# Patient Record
Sex: Male | Born: 1980 | Race: Black or African American | Hispanic: No | Smoking: Former smoker
Health system: Southern US, Community
[De-identification: ages and names within clinical notes are randomized; demographics above are authoritative.]

## PROBLEM LIST (undated history)

## (undated) ENCOUNTER — Ambulatory Visit: Admission: EM | Source: Home / Self Care

## (undated) DIAGNOSIS — G4730 Sleep apnea, unspecified: Secondary | ICD-10-CM

## (undated) DIAGNOSIS — J45909 Unspecified asthma, uncomplicated: Secondary | ICD-10-CM

## (undated) DIAGNOSIS — I4891 Unspecified atrial fibrillation: Secondary | ICD-10-CM

## (undated) DIAGNOSIS — I639 Cerebral infarction, unspecified: Secondary | ICD-10-CM

## (undated) DIAGNOSIS — T7840XA Allergy, unspecified, initial encounter: Secondary | ICD-10-CM

## (undated) DIAGNOSIS — I10 Essential (primary) hypertension: Secondary | ICD-10-CM

## (undated) HISTORY — DX: Allergy, unspecified, initial encounter: T78.40XA

## (undated) HISTORY — DX: Essential (primary) hypertension: I10

## (undated) HISTORY — DX: Unspecified asthma, uncomplicated: J45.909

## (undated) HISTORY — PX: TONSILLECTOMY: SUR1361

## (undated) HISTORY — DX: Unspecified atrial fibrillation: I48.91

---

## 2016-10-23 ENCOUNTER — Encounter: Payer: Self-pay | Admitting: Internal Medicine

## 2016-10-23 ENCOUNTER — Ambulatory Visit (INDEPENDENT_AMBULATORY_CARE_PROVIDER_SITE_OTHER): Payer: 59 | Admitting: Internal Medicine

## 2016-10-23 DIAGNOSIS — I10 Essential (primary) hypertension: Secondary | ICD-10-CM | POA: Insufficient documentation

## 2016-10-23 DIAGNOSIS — I480 Paroxysmal atrial fibrillation: Secondary | ICD-10-CM | POA: Insufficient documentation

## 2016-10-23 DIAGNOSIS — I4891 Unspecified atrial fibrillation: Secondary | ICD-10-CM | POA: Insufficient documentation

## 2016-10-23 MED ORDER — AMLODIPINE BESYLATE 10 MG PO TABS: 10.0000 mg | ORAL_TABLET | Freq: Every day | ORAL | 0 refills | Status: DC

## 2016-10-23 NOTE — Progress Notes (Signed)
HPI  Pt presents to the clinic today to establish care and for management of the conditions listed below. He is transferring care from Dr. Sena SlatePinckney at [facility].  Childhood Asthma: This has not been an issue for him as an adult.  Afib: Controlled with Carvedilol. He is not taking any anticoagulation. He does not follow with cardiology.  HTN: His BP today is 142/94. He is taking Catapres (but reports he has only taking it twice since he moved here) and Carvedilol as prescribed.  Flu: 11/2015 Tetanus: < 10 years Dentist: biannually  Past Medical History:  Diagnosis Date  . Atrial fibrillation (HCC)   . Childhood asthma   . Hypertension     Current Outpatient Prescriptions  Medication Sig Dispense Refill  . carvedilol (COREG) 12.5 MG tablet Take 1 tablet by mouth 2 (two) times daily.    . cloNIDine (CATAPRES) 0.1 MG tablet Take 0.1 mg by mouth as needed.     No current facility-administered medications for this visit.     No Known Allergies  Family History  Problem Relation Age of Onset  . Diabetes Father   . Mental illness Father   . Heart disease Maternal Grandfather     Social History   Social History  . Marital status: Married    Spouse name: N/A  . Number of children: N/A  . Years of education: N/A   Occupational History  . Not on file.   Social History Main Topics  . Smoking status: Not on file  . Smokeless tobacco: Not on file  . Alcohol use Not on file  . Drug use: Unknown  . Sexual activity: Not on file   Other Topics Concern  . Not on file   Social History Narrative  . No narrative on file    ROS:  Constitutional: Denies fever, malaise, fatigue, headache or abrupt weight changes.  HEENT: Denies eye pain, eye redness, ear pain, ringing in the ears, wax buildup, runny nose, nasal congestion, bloody nose, or sore throat. Respiratory: Denies difficulty breathing, shortness of breath, cough or sputum production.   Cardiovascular: Denies  chest pain, chest tightness, palpitations or swelling in the hands or feet.  Gastrointestinal: Denies abdominal pain, bloating, constipation, diarrhea or blood in the stool.  GU: Denies frequency, urgency, pain with urination, blood in urine, odor or discharge. Musculoskeletal: Denies decrease in range of motion, difficulty with gait, muscle pain or joint pain and swelling.  Skin: Denies redness, rashes, lesions or ulcercations.  Neurological: Denies dizziness, difficulty with memory, difficulty with speech or problems with balance and coordination.  Psych: Denies anxiety, depression, SI/HI.  No other specific complaints in a complete review of systems (except as listed in HPI above).  PE:  BP (!) 142/94   Pulse 69   Temp 98.4 F (36.9 C) (Oral)   Ht 6' 0.5" (1.842 m)   Wt 276 lb (125.2 kg)   SpO2 98%   BMI 36.92 kg/m   Wt Readings from Last 3 Encounters:  No data found for Wt    General: Appears his stated age, obese in NAD. Skin: Dry and intact. Cardiovascular: Normal rate and rhythm. S1,S2 noted.  No murmur, rubs or gallops noted.  Pulmonary/Chest: Normal effort and positive vesicular breath sounds. No respiratory distress. No wheezes, rales or ronchi noted.  Neurological: Alert and oriented.  Psychiatric: Mood and affect normal. Behavior is normal. Judgment and thought content normal.    Assessment and Plan:

## 2016-10-23 NOTE — Assessment & Plan Note (Signed)
He declines cardiology referral Continue Carvedilol for now

## 2016-10-23 NOTE — Patient Instructions (Signed)

## 2016-10-23 NOTE — Assessment & Plan Note (Signed)
Uncontrolled Start Amlodipine 10 mg daily in addition to Carvedilol Advised him not to take Clonidine at this time

## 2016-10-23 NOTE — Assessment & Plan Note (Signed)
>>  ASSESSMENT AND PLAN FOR PAROXYSMAL ATRIAL FIBRILLATION (HCC) WRITTEN ON 10/23/2016 11:37 AM BY Lorre Munroe, NP  He declines cardiology referral Continue Carvedilol for now

## 2016-10-23 NOTE — Assessment & Plan Note (Signed)
>>  ASSESSMENT AND PLAN FOR HTN (HYPERTENSION) WRITTEN ON 10/23/2016 11:38 AM BY Lorre Munroe, NP  Uncontrolled Start Amlodipine 10 mg daily in addition to Carvedilol Advised him not to take Clonidine at this time

## 2016-10-24 ENCOUNTER — Encounter: Payer: Self-pay | Admitting: Emergency Medicine

## 2016-10-24 DIAGNOSIS — K8020 Calculus of gallbladder without cholecystitis without obstruction: Secondary | ICD-10-CM | POA: Insufficient documentation

## 2016-10-24 DIAGNOSIS — R1011 Right upper quadrant pain: Secondary | ICD-10-CM | POA: Diagnosis not present

## 2016-10-24 DIAGNOSIS — I10 Essential (primary) hypertension: Secondary | ICD-10-CM | POA: Diagnosis not present

## 2016-10-24 DIAGNOSIS — Z79899 Other long term (current) drug therapy: Secondary | ICD-10-CM | POA: Diagnosis not present

## 2016-10-24 DIAGNOSIS — I4891 Unspecified atrial fibrillation: Secondary | ICD-10-CM | POA: Diagnosis not present

## 2016-10-24 DIAGNOSIS — R109 Unspecified abdominal pain: Secondary | ICD-10-CM | POA: Diagnosis present

## 2016-10-24 LAB — URINALYSIS, COMPLETE (UACMP) WITH MICROSCOPIC
BACTERIA UA: NONE SEEN
BILIRUBIN URINE: NEGATIVE
Glucose, UA: NEGATIVE mg/dL
HGB URINE DIPSTICK: NEGATIVE
Ketones, ur: NEGATIVE mg/dL
Leukocytes, UA: NEGATIVE
NITRITE: NEGATIVE
PROTEIN: NEGATIVE mg/dL
Specific Gravity, Urine: 1.003 — ABNORMAL LOW (ref 1.005–1.030)
Squamous Epithelial / LPF: NONE SEEN
pH: 6 (ref 5.0–8.0)

## 2016-10-24 LAB — CBC
HEMATOCRIT: 39 % — AB (ref 40.0–52.0)
HEMOGLOBIN: 13.2 g/dL (ref 13.0–18.0)
MCH: 27.5 pg (ref 26.0–34.0)
MCHC: 33.8 g/dL (ref 32.0–36.0)
MCV: 81.3 fL (ref 80.0–100.0)
Platelets: 279 10*3/uL (ref 150–440)
RBC: 4.8 MIL/uL (ref 4.40–5.90)
RDW: 12.7 % (ref 11.5–14.5)
WBC: 6.7 10*3/uL (ref 3.8–10.6)

## 2016-10-24 NOTE — ED Triage Notes (Signed)
Patient ambulatory to triage with steady gait, without difficulty or distress noted; pt reports upper abd and lower back pain intermittently last couple days; denies any accomp symptoms

## 2016-10-25 ENCOUNTER — Emergency Department: Payer: 59

## 2016-10-25 ENCOUNTER — Emergency Department
Admission: EM | Admit: 2016-10-25 | Discharge: 2016-10-25 | Disposition: A | Discharge status: Home / Self Care | Payer: 59 | Attending: Emergency Medicine | Admitting: Emergency Medicine

## 2016-10-25 DIAGNOSIS — K8020 Calculus of gallbladder without cholecystitis without obstruction: Secondary | ICD-10-CM

## 2016-10-25 DIAGNOSIS — R1011 Right upper quadrant pain: Secondary | ICD-10-CM

## 2016-10-25 LAB — COMPREHENSIVE METABOLIC PANEL
ALT: 28 U/L (ref 17–63)
ANION GAP: 6 (ref 5–15)
AST: 22 U/L (ref 15–41)
Albumin: 4.1 g/dL (ref 3.5–5.0)
Alkaline Phosphatase: 65 U/L (ref 38–126)
BILIRUBIN TOTAL: 0.5 mg/dL (ref 0.3–1.2)
BUN: 15 mg/dL (ref 6–20)
CO2: 27 mmol/L (ref 22–32)
Calcium: 9.2 mg/dL (ref 8.9–10.3)
Chloride: 108 mmol/L (ref 101–111)
Creatinine, Ser: 1.26 mg/dL — ABNORMAL HIGH (ref 0.61–1.24)
Glucose, Bld: 97 mg/dL (ref 65–99)
POTASSIUM: 3.8 mmol/L (ref 3.5–5.1)
Sodium: 141 mmol/L (ref 135–145)
TOTAL PROTEIN: 7.2 g/dL (ref 6.5–8.1)

## 2016-10-25 LAB — TROPONIN I

## 2016-10-25 LAB — LIPASE, BLOOD: LIPASE: 33 U/L (ref 11–51)

## 2016-10-25 MED ORDER — AMLODIPINE BESYLATE 5 MG PO TABS
10.0000 mg | ORAL_TABLET | Freq: Once | ORAL | Status: AC
Start: 2016-10-25 — End: 2016-10-25
  Administered 2016-10-25: 10 mg via ORAL
  Filled 2016-10-25: qty 2

## 2016-10-25 NOTE — ED Provider Notes (Signed)
[facility] Emergency Department Provider Note _   First MD Initiated Contact with Patient 10/25/16 [phone]     (approximate)  I have reviewed the triage vital signs and the nursing notes.   HISTORY  Chief Complaint Abdominal Pain   HPI Zachary Cruz is a 36 y.o. male with history of atrial fibrillation in 2015 no longer requiring anticoagulation, hypertension presents to the emergency department with intermittent upper abdominal pain 2 days. Patient denies any nausea or vomiting. Patient denies any diarrhea or constipation. Patient denies any urinary symptoms.   Past Medical History:  Diagnosis Date  . Atrial fibrillation (HCC)   . Childhood asthma   . Hypertension     Patient Active Problem List   Diagnosis Date Noted  . A-fib (HCC) 10/23/2016  . HTN (hypertension) 10/23/2016    Past Surgical History:  Procedure Laterality Date  . TONSILLECTOMY      Prior to Admission medications   Medication Sig Start Date End Date Taking? Authorizing Provider  amLODipine (NORVASC) 10 MG tablet Take 1 tablet (10 mg total) by mouth daily. 10/23/16   Lorre MunroeBaity, Regina W, NP  carvedilol (COREG) 12.5 MG tablet Take 1 tablet by mouth 2 (two) times daily.    [provider]  cloNIDine (CATAPRES) 0.1 MG tablet Take 0.1 mg by mouth as needed.    [provider]    Allergies No known drug allergies  Family History  Problem Relation Age of Onset  . Diabetes Father   . Mental illness Father   . Heart disease Maternal Grandfather     Social History Social History  Substance Use Topics  . Smoking status: Never Smoker  . Smokeless tobacco: Never Used  . Alcohol use No    Review of Systems Constitutional: No fever/chills Eyes: No visual changes. ENT: No sore throat. Cardiovascular: Denies chest pain. Respiratory: Denies shortness of breath. Gastrointestinal: positive for abdominal pain.  No nausea, no vomiting.  No diarrhea.  No  constipation. Genitourinary: Negative for dysuria. Musculoskeletal: Negative for neck pain.  Negative for back pain. Integumentary: Negative for rash. Neurological: Negative for headaches, focal weakness or numbness.  ____________________________________________   PHYSICAL EXAM:  VITAL SIGNS: ED Triage Vitals  Enc Vitals Group     BP 10/24/16 2340 (!) 165/100     Pulse Rate 10/24/16 2340 96     Resp 10/24/16 2340 20     Temp 10/24/16 2340 97.6 F (36.4 C)     Temp Source 10/24/16 2340 Oral     SpO2 10/24/16 2340 99 %     Weight 10/24/16 2338 124.7 kg (275 lb)     Height 10/24/16 2338 1.854 m (6\' 1" )     Head Circumference --      Peak Flow --      Pain Score 10/24/16 2337 1     Pain Loc --      Pain Edu? --      Excl. in GC? --     Constitutional: Alert and oriented. Well appearing and in no acute distress. Eyes: Conjunctivae are normal. Head: Atraumatic. Mouth/Throat: Mucous membranes are moist.  Oropharynx non-erythematous. Neck: No stridor.  Cardiovascular: Normal rate, regular rhythm. Good peripheral circulation. Grossly normal heart sounds. Respiratory: Normal respiratory effort.  No retractions. Lungs CTAB. Gastrointestinal: right upper quadrant tenderness to palpation. No distention.  Musculoskeletal: No lower extremity tenderness nor edema. No gross deformities of extremities. Neurologic:  Normal speech and language. No gross focal neurologic deficits are appreciated.  Skin:  Skin is warm, dry and intact. No rash noted. Psychiatric: Mood and affect are normal. Speech and behavior are normal.  ____________________________________________   LABS (all labs ordered are listed, but only abnormal results are displayed)  Labs Reviewed  COMPREHENSIVE METABOLIC PANEL - Abnormal; Notable for the following:       Result Value   Creatinine, Ser 1.26 (*)    All other components within normal limits  CBC - Abnormal; Notable for the following:    HCT 39.0 (*)    All  other components within normal limits  URINALYSIS, COMPLETE (UACMP) WITH MICROSCOPIC - Abnormal; Notable for the following:    Color, Urine STRAW (*)    APPearance CLEAR (*)    Specific Gravity, Urine 1.003 (*)    All other components within normal limits  LIPASE, BLOOD  TROPONIN I   ____________________________________________  EKG  ED ECG REPORT I, Myrtle N Emilyanne Mcgough, the attending physician, personally viewed and interpreted this ECG.   Date: 10/25/2016  EKG Time: 11:43 PM  Rate: 62  Rhythm: Normal sinus rhythm  Axis: normal  Intervals:normal  ST&T Change: one  ____________________________________________  RADIOLOGY I, Blanchard N Alicyn Klann, personally viewed and evaluated these images (plain radiographs) as part of my medical decision making, as well as reviewing the written report by the radiologist.  US Abdomen Limited Ruq  Result Date: 10/25/2016 CLINICAL DATA:  RIGHT upper quadrant pain for 3 days. EXAM: ULTRASOUND ABDOMEN LIMITED RIGHT UPPER QUADRANT COMPARISON:  None. FINDINGS: Gallbladder: Multiple echogenic gallstones measure up to 7 mm including nonmobile gallstone at the gallbladder neck. Many mural based potential stones could alternatively reflect polyps though, there is no comet tail artifact. No gallbladder wall thickening or pericholecystic fluid. No sonographic Murphy's sign elicited. Common bile duct: Diameter: 4 mm Liver: No focal lesion identified. Within normal limits in parenchymal echogenicity. Hepatopetal portal vein. IMPRESSION: Cholelithiasis/sludge without sonographic findings of acute cholecystitis. Electronically Signed   By: Awilda Metro M.D.   On: 10/25/2016 02:14     Procedures   ____________________________________________   INITIAL IMPRESSION / ASSESSMENT AND PLAN / ED COURSE  Pertinent labs & imaging results that were available during my care of the patient were reviewed by me and considered in my medical decision making (see chart for  details).  36 year old male presenting with history of physical exam concerning for cholelithiasis which was confirmed with ultrasound. Spoke with the patient and his wife at length regarding all clinical findings and the need to follow-up with general surgery Dr. Excell Seltzer.      ____________________________________________  FINAL CLINICAL IMPRESSION(S) / ED DIAGNOSES  Final diagnoses:  RUQ pain  Calculus of gallbladder without cholecystitis without obstruction     MEDICATIONS GIVEN DURING THIS VISIT:  Medications  amLODipine (NORVASC) tablet 10 mg (10 mg Oral Given 10/25/16 0322)     NEW OUTPATIENT MEDICATIONS STARTED DURING THIS VISIT:  Discharge Medication List as of 10/25/2016  3:18 AM      Discharge Medication List as of 10/25/2016  3:18 AM      Discharge Medication List as of 10/25/2016  3:18 AM       Note:  This document was prepared using Dragon voice recognition software and may include unintentional dictation errors.    Darci Current, MD 10/25/16 [phone]

## 2016-10-25 NOTE — ED Notes (Signed)
Pt returned from ultrasound

## 2016-10-26 ENCOUNTER — Ambulatory Visit (INDEPENDENT_AMBULATORY_CARE_PROVIDER_SITE_OTHER): Payer: 59 | Admitting: Surgery

## 2016-10-26 ENCOUNTER — Encounter: Payer: Self-pay | Admitting: Surgery

## 2016-10-26 VITALS — BP 143/88 | HR 67 | Temp 98.2°F | Ht 73.0 in | Wt 274.6 lb

## 2016-10-26 DIAGNOSIS — K8050 Calculus of bile duct without cholangitis or cholecystitis without obstruction: Secondary | ICD-10-CM

## 2016-10-26 NOTE — Progress Notes (Signed)
Zachary Cruz is an 36 y.o. male.   Chief Complaint: abd pain HPI: This patient who is in the emergency room with abdominal pain. Workup showed gallstones. His pain is completely resolved at this point. He's had no further nausea or vomiting. No jaundice or ach stools. Pain is in RUQ, single prior episode. He had an episode in 2003 which was very similar and he went to the emergency room then was told that it was his gallbladder. Past Medical History:  Diagnosis Date  . Atrial fibrillation ([place])   . Childhood asthma   . Hypertension     Past Surgical History:  Procedure Laterality Date  . TONSILLECTOMY      Family History  Problem Relation Age of Onset  . Diabetes Father   . Mental illness Father   . Heart disease Maternal Grandfather   No known family history of gallbladder disease Social History:  reports that he has never smoked. He has never used smokeless tobacco. He reports that he does not drink alcohol or use drugs.  Allergies: No Known Allergies   (Not in a hospital admission)   Review of Systems:   Review of Systems  Constitutional: Negative for chills and fever.  HENT: Negative.   Eyes: Negative.   Respiratory: Negative.   Cardiovascular: Negative.   Gastrointestinal: Positive for abdominal pain, heartburn and nausea. Negative for blood in stool, constipation, diarrhea and vomiting.  Genitourinary: Negative.   Musculoskeletal: Negative.   Skin: Negative.   Neurological: Negative.   Endo/Heme/Allergies: Negative.   Psychiatric/Behavioral: Negative.     Physical Exam:  Physical Exam  Constitutional: He is oriented to person, place, and time and well-developed, well-nourished, and in no distress. No distress.  HENT:  Head: Normocephalic and atraumatic.  Eyes: Pupils are equal, round, and reactive to light. Right eye exhibits no discharge. Left eye exhibits no discharge. No scleral icterus.  Neck: Normal range of motion.  Cardiovascular: Normal rate,  regular rhythm and normal heart sounds.   Pulmonary/Chest: Effort normal and breath sounds normal. No respiratory distress. He has no wheezes. He has no rales.  Abdominal: Soft. He exhibits no distension. There is no tenderness. There is no rebound and no guarding.  Musculoskeletal: Normal range of motion. He exhibits no edema or tenderness.  Lymphadenopathy:    He has no cervical adenopathy.  Neurological: He is alert and oriented to person, place, and time.  Skin: Skin is warm and dry. No rash noted. He is not diaphoretic. No erythema.  Psychiatric: Mood and affect normal.  Vitals reviewed.   Blood pressure (!) 143/88, pulse 67, temperature 98.2 F (36.8 C), temperature source Oral, height 6' 1"  (1.854 m), weight 274 lb 9.6 oz (124.6 kg).    Results for orders placed or performed during the hospital encounter of 10/25/16 (from the past 48 hour(s))  Lipase, blood     Status: None   Collection Time: 10/24/16 11:38 PM  Result Value Ref Range   Lipase 33 11 - 51 U/L  Comprehensive metabolic panel     Status: Abnormal   Collection Time: 10/24/16 11:38 PM  Result Value Ref Range   Sodium 141 135 - 145 mmol/L   Potassium 3.8 3.5 - 5.1 mmol/L   Chloride 108 101 - 111 mmol/L   CO2 27 22 - 32 mmol/L   Glucose, Bld 97 65 - 99 mg/dL   BUN 15 6 - 20 mg/dL   Creatinine, Ser 1.26 (H) 0.61 - 1.24 mg/dL   Calcium 9.2 8.9 -  10.3 mg/dL   Total Protein 7.2 6.5 - 8.1 g/dL   Albumin 4.1 3.5 - 5.0 g/dL   AST 22 15 - 41 U/L   ALT 28 17 - 63 U/L   Alkaline Phosphatase 65 38 - 126 U/L   Total Bilirubin 0.5 0.3 - 1.2 mg/dL   GFR calc non Af Amer >60 >60 mL/min   GFR calc Af Amer >60 >60 mL/min    Comment: (NOTE) The eGFR has been calculated using the CKD EPI equation. This calculation has not been validated in all clinical situations. eGFR's persistently <60 mL/min signify possible Chronic Kidney Disease.    Anion gap 6 5 - 15  CBC     Status: Abnormal   Collection Time: 10/24/16 11:38 PM   Result Value Ref Range   WBC 6.7 3.8 - 10.6 K/uL   RBC 4.80 4.40 - 5.90 MIL/uL   Hemoglobin 13.2 13.0 - 18.0 g/dL   HCT 39.0 (L) 40.0 - 52.0 %   MCV 81.3 80.0 - 100.0 fL   MCH 27.5 26.0 - 34.0 pg   MCHC 33.8 32.0 - 36.0 g/dL   RDW 12.7 11.5 - 14.5 %   Platelets 279 150 - 440 K/uL  Urinalysis, Complete w Microscopic     Status: Abnormal   Collection Time: 10/24/16 11:38 PM  Result Value Ref Range   Color, Urine STRAW (A) YELLOW   APPearance CLEAR (A) CLEAR   Specific Gravity, Urine 1.003 (L) 1.005 - 1.030   pH 6.0 5.0 - 8.0   Glucose, UA NEGATIVE NEGATIVE mg/dL   Hgb urine dipstick NEGATIVE NEGATIVE   Bilirubin Urine NEGATIVE NEGATIVE   Ketones, ur NEGATIVE NEGATIVE mg/dL   Protein, ur NEGATIVE NEGATIVE mg/dL   Nitrite NEGATIVE NEGATIVE   Leukocytes, UA NEGATIVE NEGATIVE   RBC / HPF 0-5 0 - 5 RBC/hpf   WBC, UA 0-5 0 - 5 WBC/hpf   Bacteria, UA NONE SEEN NONE SEEN   Squamous Epithelial / LPF NONE SEEN NONE SEEN  Troponin I     Status: None   Collection Time: 10/24/16 11:38 PM  Result Value Ref Range   Troponin I <0.03 <0.03 ng/mL   US Abdomen Limited Ruq  Result Date: 10/25/2016 CLINICAL DATA:  RIGHT upper quadrant pain for 3 days. EXAM: ULTRASOUND ABDOMEN LIMITED RIGHT UPPER QUADRANT COMPARISON:  None. FINDINGS: Gallbladder: Multiple echogenic gallstones measure up to 7 mm including nonmobile gallstone at the gallbladder neck. Many mural based potential stones could alternatively reflect polyps though, there is no comet tail artifact. No gallbladder wall thickening or pericholecystic fluid. No sonographic Murphy's sign elicited. Common bile duct: Diameter: 4 mm Liver: No focal lesion identified. Within normal limits in parenchymal echogenicity. Hepatopetal portal vein. IMPRESSION: Cholelithiasis/sludge without sonographic findings of acute cholecystitis. Electronically Signed   By: Elon Alas M.D.   On: 10/25/2016 02:14     Assessment/Plan Gallstones. Single recent  episode in the previous episode was in 2003. I recommended laparoscopic cholecystectomy for control of his symptoms. The rationale for this was discussed the options of observation reviewed the risk bleeding infection recurrence of symptoms failure to resolve all of his symptoms conversion to an open procedure bile duct damage bile duct leak retained common bile duct stone any of which could require further surgery and/or ERCP stent papillotomy were all discussed with him he understood and agreed to proceed  Florene Glen, MD, FACS

## 2016-10-26 NOTE — Patient Instructions (Signed)
We have scheduled your Laparoscopic Cholecystectomy with Dr. Excell Seltzerooper on 11/08/16.   Please see your blue sheet for further instructions.   If you need anything prior to this please give our office a call.

## 2016-10-27 ENCOUNTER — Telehealth: Payer: Self-pay | Admitting: Surgery

## 2016-10-27 NOTE — Telephone Encounter (Signed)
Pt advised of pre op date/time and sx date. Sx: 11/08/16 with Dr Ludwig Clarks cholecystectomy.  Pre op: 11/01/16 between 1-5:00pm--Phone.   Patient made aware to call [phone], between 1-3:00pm the day before surgery, to find out what time to arrive.

## 2016-11-01 ENCOUNTER — Encounter
Admission: RE | Admit: 2016-11-01 | Discharge: 2016-11-01 | Disposition: A | Discharge status: Home / Self Care | Payer: 59 | Source: Ambulatory Visit | Attending: Surgery | Admitting: Surgery

## 2016-11-01 DIAGNOSIS — Z833 Family history of diabetes mellitus: Secondary | ICD-10-CM | POA: Diagnosis not present

## 2016-11-01 DIAGNOSIS — Z818 Family history of other mental and behavioral disorders: Secondary | ICD-10-CM | POA: Insufficient documentation

## 2016-11-01 DIAGNOSIS — Z01812 Encounter for preprocedural laboratory examination: Secondary | ICD-10-CM | POA: Diagnosis not present

## 2016-11-01 DIAGNOSIS — J45909 Unspecified asthma, uncomplicated: Secondary | ICD-10-CM | POA: Insufficient documentation

## 2016-11-01 DIAGNOSIS — K8080 Other cholelithiasis without obstruction: Secondary | ICD-10-CM | POA: Diagnosis not present

## 2016-11-01 DIAGNOSIS — R109 Unspecified abdominal pain: Secondary | ICD-10-CM | POA: Insufficient documentation

## 2016-11-01 DIAGNOSIS — I10 Essential (primary) hypertension: Secondary | ICD-10-CM | POA: Insufficient documentation

## 2016-11-01 DIAGNOSIS — I4891 Unspecified atrial fibrillation: Secondary | ICD-10-CM | POA: Insufficient documentation

## 2016-11-01 DIAGNOSIS — Z8249 Family history of ischemic heart disease and other diseases of the circulatory system: Secondary | ICD-10-CM | POA: Diagnosis not present

## 2016-11-01 DIAGNOSIS — Z9889 Other specified postprocedural states: Secondary | ICD-10-CM | POA: Insufficient documentation

## 2016-11-01 HISTORY — DX: Sleep apnea, unspecified: G47.30

## 2016-11-01 NOTE — Patient Instructions (Signed)
Your procedure is scheduled on: 11/08/16 Wed Report to Same Day Surgery 2nd floor medical mall [facility] Entrance-take elevator on left to 2nd floor.  Check in with surgery information desk.) To find out your arrival time please call [phone] between 1PM - 3PM on 11/07/16 Tues  Remember: Instructions that are not followed completely may result in serious medical risk, up to and including death, or upon the discretion of your surgeon and anesthesiologist your surgery may need to be rescheduled.    _x___ 1. Do not eat food or drink liquids after midnight. No gum chewing or                              hard candies.     __x__ 2. No Alcohol for 24 hours before or after surgery.   __x__3. No Smoking for 24 prior to surgery.   ____  4. Bring all medications with you on the day of surgery if instructed.    __x__ 5. Notify your doctor if there is any change in your medical condition     (cold, fever, infections).     Do not wear jewelry, make-up, hairpins, clips or nail polish.  Do not wear lotions, powders, or perfumes. You may wear deodorant.  Do not shave 48 hours prior to surgery. Men may shave face and neck.  Do not bring valuables to the hospital.    [facility] is not responsible for any belongings or valuables.               Contacts, dentures or bridgework may not be worn into surgery.  Leave your suitcase in the car. After surgery it may be brought to your room.  For patients admitted to the hospital, discharge time is determined by your                       treatment team.   Patients discharged the day of surgery will not be allowed to drive home.  You will need someone to drive you home and stay with you the night of your procedure.    Please read over the following fact sheets that you were given:   [facility] Preparing for Surgery and or MRSA Information   _x___ Take anti-hypertensive (unless it includes a diuretic), cardiac, seizure, asthma,     anti-reflux and  psychiatric medicines. These include:  1. amLODipine (NORVASC)   2.carvedilol (COREG) 12.5 MG   3.  4.  5.  6.  ____Fleets enema or Magnesium Citrate as directed.   _x___ Use CHG Soap or sage wipes as directed on instruction sheet   ____ Use inhalers on the day of surgery and bring to hospital day of surgery  ____ Stop Metformin and Janumet 2 days prior to surgery.    ____ Take 1/2 of usual insulin dose the night before surgery and none on the morning     surgery.   _x___ Follow recommendations from Cardiologist, Pulmonologist or PCP regarding          stopping Aspirin, Coumadin, Pllavix ,Eliquis, Effient, or Pradaxa, and Pletal.  X____Stop Anti-inflammatories such as Advil, Aleve, Ibuprofen, Motrin, Naproxen, Naprosyn, Goodies powders or aspirin products. OK to take Tylenol and                          Celebrex.   _x___ Stop supplements until after surgery.  But  may continue Vitamin D, Vitamin B,       and multivitamin.   ____ Bring C-Pap to the hospital.

## 2016-11-02 ENCOUNTER — Encounter
Admission: RE | Admit: 2016-11-02 | Discharge: 2016-11-02 | Disposition: A | Discharge status: Home / Self Care | Payer: 59 | Source: Ambulatory Visit | Attending: Surgery | Admitting: Surgery

## 2016-11-02 DIAGNOSIS — Z01812 Encounter for preprocedural laboratory examination: Secondary | ICD-10-CM | POA: Diagnosis not present

## 2016-11-02 LAB — COMPREHENSIVE METABOLIC PANEL
ALK PHOS: 76 U/L (ref 38–126)
ALT: 34 U/L (ref 17–63)
AST: 23 U/L (ref 15–41)
Albumin: 4 g/dL (ref 3.5–5.0)
Anion gap: 8 (ref 5–15)
BUN: 11 mg/dL (ref 6–20)
CALCIUM: 9.1 mg/dL (ref 8.9–10.3)
CHLORIDE: 106 mmol/L (ref 101–111)
CO2: 27 mmol/L (ref 22–32)
CREATININE: 0.89 mg/dL (ref 0.61–1.24)
GFR calc Af Amer: >60 mL/min (ref >60)
GFR calc non Af Amer: >60 mL/min (ref >60)
GLUCOSE: 97 mg/dL (ref 65–99)
Potassium: 3.8 mmol/L (ref 3.5–5.1)
SODIUM: 141 mmol/L (ref 135–145)
Total Bilirubin: 0.4 mg/dL (ref 0.3–1.2)
Total Protein: 7.2 g/dL (ref 6.5–8.1)

## 2016-11-02 LAB — CBC WITH DIFFERENTIAL/PLATELET
BASOS ABS: 0 10*3/uL (ref 0–0.1)
Basophils Relative: 1 %
EOS ABS: 0.5 10*3/uL (ref 0–0.7)
EOS PCT: 8 %
HCT: 42.3 % (ref 40.0–52.0)
HEMOGLOBIN: 14.3 g/dL (ref 13.0–18.0)
LYMPHS ABS: 2.2 10*3/uL (ref 1.0–3.6)
LYMPHS PCT: 36 %
MCH: 27.8 pg (ref 26.0–34.0)
MCHC: 33.8 g/dL (ref 32.0–36.0)
MCV: 82.4 fL (ref 80.0–100.0)
Monocytes Absolute: 0.5 10*3/uL (ref 0.2–1.0)
Monocytes Relative: 8 %
NEUTROS PCT: 47 %
Neutro Abs: 3 10*3/uL (ref 1.4–6.5)
PLATELETS: 281 10*3/uL (ref 150–440)
RBC: 5.13 MIL/uL (ref 4.40–5.90)
RDW: 12.7 % (ref 11.5–14.5)
WBC: 6.2 10*3/uL (ref 3.8–10.6)

## 2016-11-08 ENCOUNTER — Encounter
Admission: RE | Disposition: A | Discharge status: Home / Self Care | Payer: Self-pay | Source: Ambulatory Visit | Attending: Surgery

## 2016-11-08 ENCOUNTER — Ambulatory Visit: Payer: 59 | Admitting: Anesthesiology

## 2016-11-08 ENCOUNTER — Ambulatory Visit
Admission: RE | Admit: 2016-11-08 | Discharge: 2016-11-08 | Disposition: A | Discharge status: Home / Self Care | Payer: 59 | Source: Ambulatory Visit | Attending: Surgery | Admitting: Surgery

## 2016-11-08 ENCOUNTER — Encounter: Payer: Self-pay | Admitting: *Deleted

## 2016-11-08 DIAGNOSIS — K8010 Calculus of gallbladder with chronic cholecystitis without obstruction: Secondary | ICD-10-CM | POA: Insufficient documentation

## 2016-11-08 DIAGNOSIS — I4891 Unspecified atrial fibrillation: Secondary | ICD-10-CM | POA: Diagnosis not present

## 2016-11-08 DIAGNOSIS — K8050 Calculus of bile duct without cholangitis or cholecystitis without obstruction: Secondary | ICD-10-CM

## 2016-11-08 DIAGNOSIS — I10 Essential (primary) hypertension: Secondary | ICD-10-CM | POA: Diagnosis not present

## 2016-11-08 DIAGNOSIS — Z79899 Other long term (current) drug therapy: Secondary | ICD-10-CM | POA: Diagnosis not present

## 2016-11-08 HISTORY — PX: CHOLECYSTECTOMY: SHX55

## 2016-11-08 SURGERY — LAPAROSCOPIC CHOLECYSTECTOMY
Anesthesia: General | Site: Abdomen | Wound class: Clean Contaminated

## 2016-11-08 MED ORDER — ROCURONIUM BROMIDE 100 MG/10ML IV SOLN
INTRAVENOUS | Status: DC | PRN
  Administered 2016-11-08: 50 mg via INTRAVENOUS

## 2016-11-08 MED ORDER — MIDAZOLAM HCL 2 MG/2ML IJ SOLN
INTRAMUSCULAR | Status: AC
  Filled 2016-11-08: qty 2

## 2016-11-08 MED ORDER — ONDANSETRON HCL 4 MG/2ML IJ SOLN
INTRAMUSCULAR | Status: DC | PRN
  Administered 2016-11-08: 4 mg via INTRAVENOUS

## 2016-11-08 MED ORDER — OXYCODONE HCL 5 MG PO TABS: 5.0000 mg | ORAL_TABLET | Freq: Once | ORAL | Status: DC | PRN

## 2016-11-08 MED ORDER — LIDOCAINE HCL (CARDIAC) 20 MG/ML IV SOLN
INTRAVENOUS | Status: DC | PRN
  Administered 2016-11-08: 100 mg via INTRAVENOUS

## 2016-11-08 MED ORDER — ROCURONIUM BROMIDE 50 MG/5ML IV SOLN
INTRAVENOUS | Status: AC
  Filled 2016-11-08: qty 1

## 2016-11-08 MED ORDER — LACTATED RINGERS IV SOLN
INTRAVENOUS | Status: DC
  Administered 2016-11-08 (×2): via INTRAVENOUS

## 2016-11-08 MED ORDER — FENTANYL CITRATE (PF) 100 MCG/2ML IJ SOLN
INTRAMUSCULAR | Status: DC | PRN
  Administered 2016-11-08: 50 ug via INTRAVENOUS

## 2016-11-08 MED ORDER — PROPOFOL 10 MG/ML IV BOLUS
INTRAVENOUS | Status: AC
  Filled 2016-11-08: qty 40

## 2016-11-08 MED ORDER — OXYCODONE HCL 5 MG/5ML PO SOLN: 5.0000 mg | Freq: Once | ORAL | Status: DC | PRN

## 2016-11-08 MED ORDER — KETOROLAC TROMETHAMINE 30 MG/ML IJ SOLN
INTRAMUSCULAR | Status: DC | PRN
  Administered 2016-11-08: 30 mg via INTRAVENOUS

## 2016-11-08 MED ORDER — CHLORHEXIDINE GLUCONATE CLOTH 2 % EX PADS: 6.0000 | MEDICATED_PAD | Freq: Once | CUTANEOUS | Status: DC

## 2016-11-08 MED ORDER — HEPARIN SODIUM (PORCINE) 5000 UNIT/ML IJ SOLN
5000.0000 [IU] | Freq: Once | INTRAMUSCULAR | Status: AC
  Administered 2016-11-08: 5000 [IU] via SUBCUTANEOUS

## 2016-11-08 MED ORDER — FAMOTIDINE 20 MG PO TABS
20.0000 mg | ORAL_TABLET | Freq: Once | ORAL | Status: AC
  Administered 2016-11-08: 20 mg via ORAL

## 2016-11-08 MED ORDER — FENTANYL CITRATE (PF) 100 MCG/2ML IJ SOLN
25.0000 ug | INTRAMUSCULAR | Status: DC | PRN
  Administered 2016-11-08: 25 ug via INTRAVENOUS
  Administered 2016-11-08: 50 ug via INTRAVENOUS
  Administered 2016-11-08: 25 ug via INTRAVENOUS

## 2016-11-08 MED ORDER — DEXAMETHASONE SODIUM PHOSPHATE 10 MG/ML IJ SOLN
INTRAMUSCULAR | Status: DC | PRN
  Administered 2016-11-08: 8 mg via INTRAVENOUS

## 2016-11-08 MED ORDER — FAMOTIDINE 20 MG PO TABS
ORAL_TABLET | ORAL | Status: AC
  Filled 2016-11-08: qty 1

## 2016-11-08 MED ORDER — MIDAZOLAM HCL 2 MG/2ML IJ SOLN
INTRAMUSCULAR | Status: DC | PRN
  Administered 2016-11-08: 2 mg via INTRAVENOUS

## 2016-11-08 MED ORDER — FENTANYL CITRATE (PF) 100 MCG/2ML IJ SOLN
INTRAMUSCULAR | Status: AC
  Filled 2016-11-08: qty 2

## 2016-11-08 MED ORDER — PROPOFOL 10 MG/ML IV BOLUS
INTRAVENOUS | Status: DC | PRN
  Administered 2016-11-08: 200 mg via INTRAVENOUS

## 2016-11-08 MED ORDER — BUPIVACAINE-EPINEPHRINE (PF) 0.25% -1:200000 IJ SOLN
INTRAMUSCULAR | Status: DC | PRN
Start: 2016-11-08 — End: 2016-11-08
  Administered 2016-11-08: 30 mL via PERINEURAL

## 2016-11-08 MED ORDER — LIDOCAINE HCL (PF) 2 % IJ SOLN
INTRAMUSCULAR | Status: AC
  Filled 2016-11-08: qty 2

## 2016-11-08 MED ORDER — ONDANSETRON HCL 4 MG/2ML IJ SOLN
INTRAMUSCULAR | Status: AC
Start: 2016-11-08 — End: 2016-11-08
  Filled 2016-11-08: qty 2

## 2016-11-08 MED ORDER — BUPIVACAINE-EPINEPHRINE (PF) 0.25% -1:200000 IJ SOLN
INTRAMUSCULAR | Status: AC
  Filled 2016-11-08: qty 30

## 2016-11-08 MED ORDER — KETOROLAC TROMETHAMINE 30 MG/ML IJ SOLN
INTRAMUSCULAR | Status: AC
Start: 2016-11-08 — End: 2016-11-08
  Filled 2016-11-08: qty 1

## 2016-11-08 MED ORDER — HEPARIN SODIUM (PORCINE) 5000 UNIT/ML IJ SOLN
INTRAMUSCULAR | Status: AC
  Filled 2016-11-08: qty 1

## 2016-11-08 MED ORDER — DEXAMETHASONE SODIUM PHOSPHATE 10 MG/ML IJ SOLN
INTRAMUSCULAR | Status: AC
  Filled 2016-11-08: qty 1

## 2016-11-08 MED ORDER — SUGAMMADEX SODIUM 200 MG/2ML IV SOLN
INTRAVENOUS | Status: AC
Start: 2016-11-08 — End: 2016-11-08
  Filled 2016-11-08: qty 2

## 2016-11-08 MED ORDER — DEXTROSE 5 % IV SOLN
3.0000 g | INTRAVENOUS | Status: AC
  Administered 2016-11-08: 3 g via INTRAVENOUS
  Filled 2016-11-08: qty 3000

## 2016-11-08 MED ORDER — SUGAMMADEX SODIUM 200 MG/2ML IV SOLN
INTRAVENOUS | Status: DC | PRN
  Administered 2016-11-08: 200 mg via INTRAVENOUS

## 2016-11-08 MED ORDER — HYDROCODONE-ACETAMINOPHEN 5-300 MG PO TABS: 1.0000 | ORAL_TABLET | ORAL | 0 refills | Status: DC | PRN

## 2016-11-08 SURGICAL SUPPLY — 43 items
ADHESIVE MASTISOL STRL (MISCELLANEOUS) ×3 IMPLANT
APPLIER CLIP ROT 10 11.4 M/L (STAPLE) ×3
BLADE SURG SZ11 CARB STEEL (BLADE) ×3 IMPLANT
CANISTER SUCT 1200ML W/VALVE (MISCELLANEOUS) ×3 IMPLANT
CATH CHOLANGI 4FR 420404F (CATHETERS) IMPLANT
CHLORAPREP W/TINT 26ML (MISCELLANEOUS) ×3 IMPLANT
CLIP APPLIE ROT 10 11.4 M/L (STAPLE) ×1 IMPLANT
CLOSURE WOUND 1/2 X4 (GAUZE/BANDAGES/DRESSINGS) ×1
CONRAY 60ML FOR OR (MISCELLANEOUS) IMPLANT
DRAPE C-ARM XRAY 36X54 (DRAPES) IMPLANT
ELECT REM PT RETURN 9FT ADLT (ELECTROSURGICAL) ×3
ELECTRODE REM PT RTRN 9FT ADLT (ELECTROSURGICAL) ×1 IMPLANT
GAUZE SPONGE NON-WVN 2X2 STRL (MISCELLANEOUS) ×4 IMPLANT
GLOVE BIO SURGEON STRL SZ8 (GLOVE) ×3 IMPLANT
GOWN STRL REUS W/ TWL LRG LVL3 (GOWN DISPOSABLE) ×3 IMPLANT
GOWN STRL REUS W/TWL LRG LVL3 (GOWN DISPOSABLE) ×6
IRRIGATION STRYKERFLOW (MISCELLANEOUS) ×1 IMPLANT
IRRIGATOR STRYKERFLOW (MISCELLANEOUS) ×3
IV CATH ANGIO 12GX3 LT BLUE (NEEDLE) ×3 IMPLANT
IV NS 1000ML (IV SOLUTION) ×2
IV NS 1000ML BAXH (IV SOLUTION) ×1 IMPLANT
JACKSON PRATT 10 (INSTRUMENTS) IMPLANT
KIT RM TURNOVER STRD PROC AR (KITS) ×3 IMPLANT
LABEL OR SOLS (LABEL) ×3 IMPLANT
NDL SAFETY 22GX1.5 (NEEDLE) ×3 IMPLANT
NEEDLE VERESS 14GA 120MM (NEEDLE) ×3 IMPLANT
NS IRRIG 500ML POUR BTL (IV SOLUTION) ×3 IMPLANT
PACK LAP CHOLECYSTECTOMY (MISCELLANEOUS) ×3 IMPLANT
POUCH SPECIMEN RETRIEVAL 10MM (ENDOMECHANICALS) ×3 IMPLANT
SCISSORS METZENBAUM CVD 33 (INSTRUMENTS) IMPLANT
SLEEVE ENDOPATH XCEL 5M (ENDOMECHANICALS) ×6 IMPLANT
SPONGE LAP 18X18 5 PK (GAUZE/BANDAGES/DRESSINGS) ×3 IMPLANT
SPONGE VERSALON 2X2 STRL (MISCELLANEOUS) ×8
SPONGE VERSALON 4X4 4PLY (MISCELLANEOUS) IMPLANT
STRIP CLOSURE SKIN 1/2X4 (GAUZE/BANDAGES/DRESSINGS) ×2 IMPLANT
SUT MNCRL 4-0 (SUTURE) ×2
SUT MNCRL 4-0 27XMFL (SUTURE) ×1
SUT VICRYL 0 AB UR-6 (SUTURE) ×3 IMPLANT
SUTURE MNCRL 4-0 27XMF (SUTURE) ×1 IMPLANT
SYR 20CC LL (SYRINGE) ×3 IMPLANT
TROCAR XCEL NON-BLD 11X100MML (ENDOMECHANICALS) ×3 IMPLANT
TROCAR XCEL NON-BLD 5MMX100MML (ENDOMECHANICALS) ×3 IMPLANT
TUBING INSUFFLATOR HI FLOW (MISCELLANEOUS) ×3 IMPLANT

## 2016-11-08 NOTE — Anesthesia Postprocedure Evaluation (Signed)
Anesthesia Post Note  Patient: Zachary Cruz  Procedure(s) Performed: Procedure(s) (LRB): LAPAROSCOPIC CHOLECYSTECTOMY (N/A)  Patient location during evaluation: PACU Anesthesia Type: General Level of consciousness: awake and alert Pain management: pain level controlled Vital Signs Assessment: post-procedure vital signs reviewed and stable Respiratory status: spontaneous breathing, nonlabored ventilation, respiratory function stable and patient connected to nasal cannula oxygen Cardiovascular status: blood pressure returned to baseline and stable Postop Assessment: no signs of nausea or vomiting Anesthetic complications: no     Last Vitals:  Vitals:   11/08/16 1335 11/08/16 1344  BP: (!) 140/94 132/87  Pulse: 63 66  Resp: 14 18  Temp: 36.7 C 37.1 C  SpO2: 95% 100%    Last Pain:  Vitals:   11/08/16 1344  TempSrc: Oral  PainSc: 0-No pain                 Cleda Mccreedy Bryttany Tortorelli

## 2016-11-08 NOTE — Anesthesia Procedure Notes (Signed)
Procedure Name: Intubation Date/Time: 11/08/2016 11:46 AM Performed by: Tamala Julian, Yailine Ballard Pre-anesthesia Checklist: Patient identified, Emergency Drugs available, Suction available, Patient being monitored and Timeout performed Patient Re-evaluated:Patient Re-evaluated prior to induction Oxygen Delivery Method: Circle system utilized Preoxygenation: Pre-oxygenation with 100% oxygen Induction Type: IV induction Ventilation: Mask ventilation without difficulty Laryngoscope Size: Mac and 4 Grade View: Grade I Tube type: Oral Tube size: 7.5 mm Number of attempts: 1 Airway Equipment and Method: Stylet Placement Confirmation: ETT inserted through vocal cords under direct vision,  positive ETCO2 and breath sounds checked- equal and bilateral Secured at: 23 cm Tube secured with: Tape Dental Injury: Teeth and Oropharynx as per pre-operative assessment

## 2016-11-08 NOTE — Op Note (Signed)
Laparoscopic Cholecystectomy  Pre-operative Diagnosis: Biliary colic  Post-operative Diagnosis: Biliary colic  Procedure: Laparoscopic cholecystectomy  Surgeon: Adah Salvage. Excell Seltzer, MD FACS  Anesthesia: Gen. with endotracheal tube  Assistant: Surgical tech  Procedure Details  The patient was seen again in the Holding Room. The benefits, complications, treatment options, and expected outcomes were discussed with the patient. The risks of bleeding, infection, recurrence of symptoms, failure to resolve symptoms, bile duct damage, bile duct leak, retained common bile duct stone, bowel injury, any of which could require further surgery and/or ERCP, stent, or papillotomy were reviewed with the patient. The likelihood of improving the patient's symptoms with return to their baseline status is good.  The patient and/or family concurred with the proposed plan, giving informed consent.  The patient was taken to Operating Room, identified as Zachary Cruz and the procedure verified as Laparoscopic Cholecystectomy.  A Time Out was held and the above information confirmed.  Prior to the induction of general anesthesia, antibiotic prophylaxis was administered. VTE prophylaxis was in place. General endotracheal anesthesia was then administered and tolerated well. After the induction, the abdomen was prepped with Chloraprep and draped in the sterile fashion. The patient was positioned in the supine position.  Local anesthetic  was injected into the skin near the umbilicus and an incision made. The Veress needle was placed. Pneumoperitoneum was then created with CO2 and tolerated well without any adverse changes in the patient's vital signs. A 16mm port was placed in the periumbilical position and the abdominal cavity was explored.  Two 5-mm ports were placed in the right upper quadrant and a 12 mm epigastric port was placed all under direct vision. All skin incisions  were infiltrated with a local anesthetic agent  before making the incision and placing the trocars.   The patient was positioned  in reverse Trendelenburg, tilted slightly to the patient's left.  The gallbladder was identified, the fundus grasped and retracted cephalad. Adhesions were lysed bluntly. The infundibulum was grasped and retracted laterally, exposing the peritoneum overlying the triangle of Calot. This was then divided and exposed in a blunt fashion. A critical view of the cystic duct and cystic artery was obtained.  The cystic duct was clearly identified and bluntly dissected.   The cystic lymphatics were doubly clipped and divided. This allowed for good visualization of the cystic duct as it entered the infundibulum of the gallbladder. Here it was doubly clipped and divided as well. The cystic artery was then doubly clipped and divided.  The gallbladder was taken from the gallbladder fossa in a retrograde fashion with the electrocautery. The gallbladder was removed and placed in an Endocatch bag. The liver bed was irrigated and inspected. Hemostasis was achieved with the electrocautery. Copious irrigation was utilized and was repeatedly aspirated until clear.  The gallbladder and Endocatch sac were then removed through the epigastric port site.   Inspection of the right upper quadrant was performed. No bleeding, bile duct injury or leak, or bowel injury was noted. Pneumoperitoneum was released.  The epigastric port site was closed with figure-of-eight 0 Vicryl sutures. 4-0 subcuticular Monocryl was used to close the skin. Steristrips and Mastisol and sterile dressings were  applied.  The patient was then extubated and brought to the recovery room in stable condition. Sponge, lap, and needle counts were correct at closure and at the conclusion of the case.   Findings: Chronic Cholecystitis   Estimated Blood Loss minimal         Drains: None  Specimens: Gallbladder           Complications: none               Zachary Defranco E.  Zachary Knack, MD, FACS

## 2016-11-08 NOTE — Anesthesia Post-op Follow-up Note (Signed)
Anesthesia QCDR form completed.        

## 2016-11-08 NOTE — Progress Notes (Signed)
Preoperative Review   Patient is met in the preoperative holding area. The history is reviewed in the chart and with the patient. I personally reviewed the options and rationale as well as the risks of this procedure that have been previously discussed with the patient. All questions asked by the patient and/or family were answered to their satisfaction.  Patient agrees to proceed with this procedure at this time.  Zachary Cruz E [name] M.D. FACS  

## 2016-11-08 NOTE — Anesthesia Preprocedure Evaluation (Signed)
Anesthesia Evaluation  Patient identified by MRN, date of birth, ID band Patient awake    Reviewed: Allergy & Precautions, H&P , NPO status , Patient's Chart, lab work & pertinent test results  History of Anesthesia Complications Negative for: history of anesthetic complications  Airway Mallampati: II  TM Distance: >3 FB Neck ROM: full    Dental  (+) Chipped   Pulmonary neg shortness of breath, asthma , sleep apnea and Continuous Positive Airway Pressure Ventilation ,           Cardiovascular Exercise Tolerance: Good hypertension, (-) angina(-) Past MI and (-) DOE + dysrhythmias Atrial Fibrillation      Neuro/Psych negative neurological ROS  negative psych ROS   GI/Hepatic negative GI ROS, Neg liver ROS, neg GERD  ,  Endo/Other  negative endocrine ROS  Renal/GU      Musculoskeletal   Abdominal   Peds  Hematology negative hematology ROS (+)   Anesthesia Other Findings Past Medical History: No date: Atrial fibrillation (HCC) No date: Childhood asthma No date: Hypertension No date: Sleep apnea  Past Surgical History: No date: TONSILLECTOMY  BMI    Body Mass Index:  36.28 kg/m      Reproductive/Obstetrics negative OB ROS                             Anesthesia Physical Anesthesia Plan  ASA: III  Anesthesia Plan: General ETT   Post-op Pain Management:    Induction: Intravenous  PONV Risk Score and Plan: 3 and Ondansetron, Dexamethasone, Midazolam and Treatment may vary due to age or medical condition  Airway Management Planned: Oral ETT  Additional Equipment:   Intra-op Plan:   Post-operative Plan: Extubation in OR  Informed Consent: I have reviewed the patients History and Physical, chart, labs and discussed the procedure including the risks, benefits and alternatives for the proposed anesthesia with the patient or authorized representative who has indicated his/her  understanding and acceptance.   Dental Advisory Given  Plan Discussed with: Anesthesiologist, CRNA and Surgeon  Anesthesia Plan Comments: (Patient consented for risks of anesthesia including but not limited to:  - adverse reactions to medications - damage to teeth, lips or other oral mucosa - sore throat or hoarseness - Damage to heart, brain, lungs or loss of life  Patient voiced understanding.)        Anesthesia Quick Evaluation

## 2016-11-08 NOTE — Transfer of Care (Signed)
Immediate Anesthesia Transfer of Care Note  Patient: Zachary Cruz  Procedure(s) Performed: Procedure(s): LAPAROSCOPIC CHOLECYSTECTOMY (N/A)  Patient Location: PACU  Anesthesia Type:General  Level of Consciousness: awake, oriented, drowsy and patient cooperative  Airway & Oxygen Therapy: Patient Spontanous Breathing and Patient connected to face mask oxygen  Post-op Assessment: Report given to RN, Post -op Vital signs reviewed and stable and Patient moving all extremities  Post vital signs: Reviewed and stable  Last Vitals:  Vitals:   11/08/16 0940 11/08/16 1223  BP: 122/85 (!) 131/103  Pulse: 71 83  Resp: 16 (!) 1  Temp: 36.9 C (!) 36.2 C  SpO2: 99% 100%    Last Pain:  Vitals:   11/08/16 0940  TempSrc: Oral         Complications: No apparent anesthesia complications

## 2016-11-08 NOTE — Discharge Instructions (Addendum)
Remove dressing in 24 hours. °May shower in 24 hours. °Leave paper strips in place. °Resume all home medications. °Follow-up with Dr. Cooper in 10 days. ° °AMBULATORY SURGERY  °DISCHARGE INSTRUCTIONS ° ° °1) The drugs that you were given will stay in your system until tomorrow so for the next 24 hours you should not: ° °A) Drive an automobile °B) Make any legal decisions °C) Drink any alcoholic beverage ° ° °2) You may resume regular meals tomorrow.  Today it is better to start with liquids and gradually work up to solid foods. ° °You may eat anything you prefer, but it is better to start with liquids, then soup and crackers, and gradually work up to solid foods. ° ° °3) Please notify your doctor immediately if you have any unusual bleeding, trouble breathing, redness and pain at the surgery site, drainage, fever, or pain not relieved by medication. ° °Please contact your physician with any problems or Same Day Surgery at [phone], Monday through Friday 6 am to 4 pm, or [facility] Main number at [phone]. °

## 2016-11-09 ENCOUNTER — Telehealth: Payer: Self-pay

## 2016-11-09 LAB — SURGICAL PATHOLOGY

## 2016-11-09 NOTE — Telephone Encounter (Signed)
Post-op call made to patient at this time. Spoke with patient. Post-op interview questions below.  1. How are you feeling? good  2. Is your pain controlled? yes  3. What are you doing for the pain? vicodiene  4. Are you having any Nausea or Vomiting? no  5. Are you having any Fever or Chills? no  6. Are you having any Constipation or Diarrhea? constipation  7. Is there any Swelling or Bruising you are concerned about? no  8. Do you have any questions or concerns at this time? no   Discussion: Patient stated he is doing well no issues at this time. Patient reminded of appointment 11/17/16 @ 10:30 am Dr.Cooper.

## 2016-11-17 ENCOUNTER — Ambulatory Visit (INDEPENDENT_AMBULATORY_CARE_PROVIDER_SITE_OTHER): Payer: 59 | Admitting: Surgery

## 2016-11-17 ENCOUNTER — Encounter: Payer: Self-pay | Admitting: Surgery

## 2016-11-17 VITALS — BP 118/82 | HR 87 | Temp 98.1°F | Ht 73.0 in | Wt 273.2 lb

## 2016-11-17 DIAGNOSIS — K8050 Calculus of bile duct without cholangitis or cholecystitis without obstruction: Secondary | ICD-10-CM

## 2016-11-17 NOTE — Progress Notes (Signed)
Outpatient postop visit  11/17/2016  Zachary Cruz is an 36 y.o. male.    Procedure: Laparoscopic cholecystectomy  CC: Problems sleeping and constipation  HPI: Patient seems to be doing well after laparoscopic cholecystectomy he's eating well has had some constipation problems he is not taking narcotics any longer.  Medications reviewed.    Physical Exam:  BP 118/82   Pulse 87   Temp 98.1 F (36.7 C) (Oral)   Ht 6\' 1"  (1.854 m)   Wt 273 lb 3.2 oz (123.9 kg)   BMI 36.04 kg/m     PE: No acute distress no icterus no jaundice abdomen soft nontender wounds are clean no erythema or drainage    Assessment/Plan:  Pathology is reviewed. This patient status post laparoscopic cholecystectomy. Patient doing very well recommend no heavy lifting for another 2 weeks and follow up on an as-needed basis.  Lattie Hawichard E Despina Boan, MD, FACS

## 2016-11-17 NOTE — Patient Instructions (Signed)

## 2016-11-20 ENCOUNTER — Ambulatory Visit: Payer: 59 | Admitting: Internal Medicine

## 2016-11-23 ENCOUNTER — Encounter: Payer: Self-pay | Admitting: Internal Medicine

## 2016-11-23 ENCOUNTER — Ambulatory Visit (INDEPENDENT_AMBULATORY_CARE_PROVIDER_SITE_OTHER): Payer: 59 | Admitting: Internal Medicine

## 2016-11-23 DIAGNOSIS — I10 Essential (primary) hypertension: Secondary | ICD-10-CM | POA: Diagnosis not present

## 2016-11-23 MED ORDER — CARVEDILOL 12.5 MG PO TABS: 12.5000 mg | ORAL_TABLET | Freq: Two times a day (BID) | ORAL | 3 refills | Status: DC

## 2016-11-23 MED ORDER — AMLODIPINE BESYLATE 10 MG PO TABS: 10.0000 mg | ORAL_TABLET | Freq: Every day | ORAL | 3 refills | Status: DC

## 2016-11-23 NOTE — Patient Instructions (Signed)

## 2016-11-23 NOTE — Progress Notes (Signed)
   Subjective:    Patient ID: Zachary Cruz, male    DOB: 10-01-1980, 36 y.o.   MRN: [id number]  HPI  Pt presents to the clinic today for 1 month follow up of HTN. At his last visit, he was advised not to take Clonidine (which he was taking prn). He was started on Amlodipine in addition to his Carvedilol. He has been taking the medications as prescribed. He reports he has had trouble falling asleep and staying asleep intermittently since starting the meds. His BP today is 122/80. ECG from 10/2016 reviewed.  Review of Systems      Past Medical History:  Diagnosis Date  . Atrial fibrillation (HCC)   . Childhood asthma   . Hypertension   . Sleep apnea     Current Outpatient Prescriptions  Medication Sig Dispense Refill  . amLODipine (NORVASC) 10 MG tablet Take 1 tablet (10 mg total) by mouth daily. 30 tablet 0  . carvedilol (COREG) 12.5 MG tablet Take 12.5 mg by mouth 2 (two) times daily.     Marland Kitchen. MAGNESIUM PO Take 1 tablet by mouth every other day. At night    . Multiple Vitamin (MULTIVITAMIN WITH MINERALS) TABS tablet Take 1 tablet by mouth 4 (four) times a week. Takes 3-4 times a week     No current facility-administered medications for this visit.     No Known Allergies  Family History  Problem Relation Age of Onset  . Diabetes Father   . Mental illness Father   . Heart disease Maternal Grandfather     Social History   Social History  . Marital status: Married    Spouse name: N/A  . Number of children: N/A  . Years of education: N/A   Occupational History  . Not on file.   Social History Main Topics  . Smoking status: Never Smoker  . Smokeless tobacco: Never Used  . Alcohol use No  . Drug use: No  . Sexual activity: Yes   Other Topics Concern  . Not on file   Social History Narrative  . No narrative on file     Constitutional: Denies fever, malaise, fatigue, headache or abrupt weight changes.  HEENT: Denies eye pain, eye redness, ear pain, ringing in the  ears, wax buildup, runny nose, nasal congestion, bloody nose, or sore throat. Respiratory: Denies difficulty breathing, shortness of breath, cough or sputum production.   Cardiovascular: Denies chest pain, chest tightness, palpitations or swelling in the hands or feet.  Gastrointestinal: Denies abdominal pain, bloating, constipation, diarrhea or blood in the stool.  GU: Denies urgency, frequency, pain with urination, burning sensation, blood in urine, odor or discharge. Musculoskeletal: Denies decrease in range of motion, difficulty with gait, muscle pain or joint pain and swelling.  Skin: Denies redness, rashes, lesions or ulcercations.  Neurological: Denies dizziness, difficulty with memory, difficulty with speech or problems with balance and coordination.  Psych: Denies anxiety, depression, SI/HI.  No other specific complaints in a complete review of systems (except as listed in HPI above).  Objective:   Physical Exam        Assessment & Plan:

## 2016-11-23 NOTE — Assessment & Plan Note (Signed)
Now at goal Continue Carvedilol and Amlodipine Refilled today  RTC in 1 year for your annual exam

## 2016-11-23 NOTE — Assessment & Plan Note (Signed)
>>  ASSESSMENT AND PLAN FOR HTN (HYPERTENSION) WRITTEN ON 11/23/2016 10:41 AM BY Nicki Reaper W, NP  Now at goal Continue Carvedilol and Amlodipine Refilled today  RTC in 1 year for your annual exam

## 2016-11-27 ENCOUNTER — Ambulatory Visit: Payer: 59 | Admitting: Internal Medicine

## 2017-01-30 ENCOUNTER — Telehealth: Payer: Self-pay | Admitting: Internal Medicine

## 2017-01-30 NOTE — Telephone Encounter (Signed)
Copied from CRM [phone]. Topic: Quick Communication - See Telephone Encounter >> Jan 30, 2017  3:54 PM Diana EvesHoyt, Maryann B wrote: CRM for notification. See Telephone encounter for:  Pts CPAP machine broke and to get it replaced he needs a note from his provider stating he needs it.  01/30/17.

## 2017-01-31 NOTE — Telephone Encounter (Signed)
I don't write orders for CPAP machines. This is done by pulmonology. I also have no record that he has OSA and is on a CPAP. When was his last sleep study and where was it?

## 2017-01-31 NOTE — Telephone Encounter (Signed)
Left detailed msg on VM per HIPAA  

## 2017-02-05 ENCOUNTER — Telehealth: Payer: Self-pay

## 2017-02-05 NOTE — Telephone Encounter (Signed)
Copied from CRM [id number]. Topic: Referral - Request >> Jan 31, 2017  6:09 PM Alexander BergeronBarksdale, Harvey B wrote: Reason for CRM: pt is asking for a referral to a pulmonologist, contact pt to advise

## 2017-02-05 NOTE — Telephone Encounter (Signed)
I spoke with pt; has seen pulmonologist in [place] in 2016. Pt was dx with sleep apnea at that time. Pt had sleep study done in 2016 in [place]; pt is not sure if has copy of sleep study and will contact dr to have copy of sleep study faxed to [phone]. .Marland Kitchen

## 2017-02-05 NOTE — Telephone Encounter (Signed)
See 01/30/17 phone note.

## 2017-02-05 NOTE — Telephone Encounter (Signed)
Will need copy of sleep study, or he will need to come back in for an appt to discuss and for referral placement.

## 2017-02-09 ENCOUNTER — Telehealth: Payer: Self-pay | Admitting: Internal Medicine

## 2017-02-09 DIAGNOSIS — G4733 Obstructive sleep apnea (adult) (pediatric): Secondary | ICD-10-CM

## 2017-02-09 NOTE — Telephone Encounter (Signed)
Please see other phone note dated 02/09/17.

## 2017-02-09 NOTE — Telephone Encounter (Signed)
Referral to pulmonology placed.

## 2017-02-09 NOTE — Addendum Note (Signed)
Addended by: Lorre MunroeBAITY, Dennisse Swader W on: 02/09/2017 03:41 PM   Modules accepted: Orders

## 2017-02-09 NOTE — Telephone Encounter (Signed)
[facility] It does mention that he has OSA and is on CPAP. They did not provide a copy of his sleep study. If he does not have this, I need to know. I can place referral to pulmonology for repeat sleep study. Let me know what he would like to do.

## 2017-02-09 NOTE — Telephone Encounter (Signed)
Patient called to say that his last Sleep Study was done over 2 years ago. He did not know the exact date but thinks it was in 2015. He is also asking about seeing a pulmonologist .

## 2017-02-15 ENCOUNTER — Encounter: Payer: Self-pay | Admitting: Internal Medicine

## 2017-02-15 ENCOUNTER — Ambulatory Visit (INDEPENDENT_AMBULATORY_CARE_PROVIDER_SITE_OTHER): Payer: 59 | Admitting: Internal Medicine

## 2017-02-15 VITALS — BP 100/70 | HR 70 | Resp 16 | Ht 73.0 in | Wt 280.0 lb

## 2017-02-15 DIAGNOSIS — G4719 Other hypersomnia: Secondary | ICD-10-CM | POA: Diagnosis not present

## 2017-02-15 NOTE — Patient Instructions (Addendum)
Will send for a sleep study.    Sleep Apnea Sleep apnea is disorder that affects a person's sleep. A person with sleep apnea has abnormal pauses in their breathing when they sleep. It is hard for them to get a good sleep. This makes a person tired during the day. It also can lead to other physical problems. There are three types of sleep apnea. One type is when breathing stops for a short time because your airway is blocked (obstructive sleep apnea). Another type is when the brain sometimes fails to give the normal signal to breathe to the muscles that control your breathing (central sleep apnea). The third type is a combination of the other two types. HOME CARE   Take all medicine as told by your doctor.  Avoid alcohol, calming medicines (sedatives), and depressant drugs.  Try to lose weight if you are overweight. Talk to your doctor about a healthy weight goal.  Your doctor may have you use a device that helps to open your airway. It can help you get the air that you need. It is called a positive airway pressure (PAP) device.   MAKE SURE YOU:   Understand these instructions.  Will watch your condition.  Will get help right away if you are not doing well or get worse.  It may take approximately 1 month for you to get used to wearing her CPAP every night.  Be sure to work with your machine to get used to it, be patient, it may take time!

## 2017-02-15 NOTE — Progress Notes (Signed)
[facility]      Assessment and Plan:  Excessive daytime sleepiness with symptoms and signs of obstructive sleep apnea. -Known obstructive sleep apnea, diagnosed in 2015, machine has since broke approximately 6 months ago. - Per insurance requirements we will send the patient for a new sleep study to requalify him for another CPAP machine.  Atrial fibrillation, essential hypertension. -Sleep apnea can contribute to the above conditions, therefore it is important to treat the underlying sleep apnea to help control atrial fibrillation and hypertension.   Date: 02/15/2017  MRN# [id number] Zachary Cruz Puzzo 05-20-1980   Zachary Cruz [place] is a 36 y.o. old male seen in consultation for chief complaint of:    Chief Complaint  Patient presents with  . Advice Only    referred by Nicki Reaperegina Baity for sleep evaluation  . excessive daytime sleepiness    Pt states spouse has witnessed apnea and snoring.    HPI:   The patient is a 36 year old male with a history of obstructive sleep apnea.  His machine broke and he has since stopped using CPAP.  [place] sleep [place] was [place] in [place].  Currently since not using his CPAP for the past 6 months. He was doing well, he was not snoring, he was more awake during the day. He moved 6 months ago and it broke, since that time he is not sleeping well at night he has difficulty falling asleep.  He goes to bed between 1130 and 2 AM, he gets out of bed at 6:30 AM.  He has gained approximately 40 pounds in the last 2 years.  He works in [employer], he was previously on CPAP he thinks at a pressure of 9-14 cm H2O.    PMHX:   Past Medical History:  Diagnosis Date  . Atrial fibrillation (HCC)   . Childhood asthma   . Hypertension   . Sleep apnea    Surgical Hx:  Past Surgical History:  Procedure Laterality Date  . CHOLECYSTECTOMY N/A 11/08/2016   Procedure: LAPAROSCOPIC CHOLECYSTECTOMY;  Surgeon: Lattie Hawooper, Richard E, MD;  Location: [facility] ORS;   Service: General;  Laterality: N/A;  . TONSILLECTOMY     Family Hx:  Family History  Problem Relation Age of Onset  . Diabetes Father   . Mental illness Father   . Heart disease Maternal Grandfather    Social Hx:   Social History   Tobacco Use  . Smoking status: Never Smoker  . Smokeless tobacco: Never Used  Substance Use Topics  . Alcohol use: No  . Drug use: No   Medication:    Current Outpatient Medications:  .  amLODipine (NORVASC) 10 MG tablet, Take 1 tablet (10 mg total) by mouth daily., Disp: 90 tablet, Rfl: 3 .  carvedilol (COREG) 12.5 MG tablet, Take 1 tablet (12.5 mg total) by mouth 2 (two) times daily., Disp: 180 tablet, Rfl: 3 .  MAGNESIUM PO, Take 1 tablet by mouth every other day. At night, Disp: , Rfl:  .  Multiple Vitamin (MULTIVITAMIN WITH MINERALS) TABS tablet, Take 1 tablet by mouth 4 (four) times a week. Takes 3-4 times a week, Disp: , Rfl:    Allergies:  Patient has no known allergies.  Review of Systems: Gen:  Denies  fever, sweats, chills HEENT: Denies blurred vision, double vision. bleeds, sore throat Cvc:  No dizziness, chest pain. Resp:   Denies cough or sputum production, shortness of breath Gi: Denies swallowing difficulty, stomach pain. Gu:  Denies bladder incontinence, burning  urine Ext:   No Joint pain, stiffness. Skin: No skin rash,  hives  Endoc:  No polyuria, polydipsia. Psych: No depression, insomnia. Other:  All other systems were reviewed with the patient and were negative other that what is mentioned in the HPI.   Physical Examination:   VS: BP 100/70 (BP Location: Left Arm, Cuff Size: Large)   Pulse 70   Resp 16   Ht 6\' 1"  (1.854 m)   Wt 280 lb (127 kg)   SpO2 99%   BMI 36.94 kg/m   General Appearance: No distress  Neuro:without focal findings,  speech normal,  HEENT: PERRLA, EOM intact.  Mallampati 2 Pulmonary: normal breath sounds, No wheezing.  CardiovascularNormal S1,S2.  No m/r/g.   Abdomen: Benign, Soft,  non-tender. Renal:  No costovertebral tenderness  GU:  No performed at this time. Endoc: No evident thyromegaly, no signs of acromegaly. Skin:   warm, no rashes, no ecchymosis  Extremities: normal, no cyanosis, clubbing.  Other findings:    LABORATORY PANEL:   CBC No results for input(s): WBC, HGB, HCT, PLT in the last 168 hours. ------------------------------------------------------------------------------------------------------------------  Chemistries  No results for input(s): NA, K, CL, CO2, GLUCOSE, BUN, CREATININE, CALCIUM, MG, AST, ALT, ALKPHOS, BILITOT in the last 168 hours.  Invalid input(s): GFRCGP ------------------------------------------------------------------------------------------------------------------  Cardiac Enzymes No results for input(s): TROPONINI in the last 168 hours. ------------------------------------------------------------  RADIOLOGY:  No results found.     Thank  you for the consultation and for allowing [facility] to assist in the care of your patient. Our recommendations are noted above.  Please contact us if we can be of further service.   Wells Guileseep Bexleigh Theriault, MD.  Board Certified in Internal Medicine, Pulmonary Medicine, Critical Care Medicine, and Sleep Medicine.  [facility] Office Number: [phone]  Santiago Gladavid Kasa, M.D.  Billy Fischeravid Simonds, M.D  02/15/2017

## 2017-03-27 ENCOUNTER — Encounter: Payer: Self-pay | Admitting: Internal Medicine

## 2017-03-27 DIAGNOSIS — G4719 Other hypersomnia: Secondary | ICD-10-CM

## 2017-03-27 DIAGNOSIS — G4733 Obstructive sleep apnea (adult) (pediatric): Secondary | ICD-10-CM

## 2017-03-28 DIAGNOSIS — G4733 Obstructive sleep apnea (adult) (pediatric): Secondary | ICD-10-CM | POA: Diagnosis not present

## 2017-03-29 ENCOUNTER — Telehealth: Payer: Self-pay | Admitting: *Deleted

## 2017-03-29 DIAGNOSIS — G4733 Obstructive sleep apnea (adult) (pediatric): Secondary | ICD-10-CM

## 2017-03-29 NOTE — Telephone Encounter (Signed)
Patient aware of results. Orders placed nothing further needed. 

## 2017-04-02 ENCOUNTER — Telehealth: Payer: Self-pay | Admitting: *Deleted

## 2017-04-02 DIAGNOSIS — I10 Essential (primary) hypertension: Secondary | ICD-10-CM

## 2017-04-02 DIAGNOSIS — I480 Paroxysmal atrial fibrillation: Secondary | ICD-10-CM

## 2017-04-02 NOTE — Telephone Encounter (Signed)
Referral placed.

## 2017-04-02 NOTE — Telephone Encounter (Signed)
Copied from CRM [id number]. Topic: Referral - Request >> Apr 02, 2017  2:16 PM Landry MellowFoltz, Melissa J wrote: Reason for CRM: pt would like to have referral to cardiologist in . Cb# [id number]   Spoke to patient and was advised that he has a history of Afib, hypertension and sleep apnea. Patient stated that he had declined a referral to a cardiologist earlier and has changed his mind. Patient stated that he [place].

## 2017-04-02 NOTE — Addendum Note (Signed)
Addended by: Lorre MunroeBAITY, Nycholas Rayner W on: 04/02/2017 03:29 PM   Modules accepted: Orders

## 2017-04-26 ENCOUNTER — Other Ambulatory Visit: Payer: Self-pay | Admitting: Internal Medicine

## 2017-04-26 NOTE — Telephone Encounter (Signed)
I spoke with Zachary LimboKiara at [facility] [address]. Pt does have available refills on coreg and norvasc. Both rx will be ready for pick up on 04/27/17; per DPR left v/m letting pt know this information.

## 2017-04-26 NOTE — Telephone Encounter (Signed)
Coreg and Norvasc refill requests

## 2017-04-26 NOTE — Telephone Encounter (Signed)
Copied from CRM [phone]#[id number]. Topic: Quick Communication - Rx Refill/Question >> Apr 26, 2017 12:28 PM Jatia Musa, Marcos EkeSharamare E, NT wrote: Medication: carvedilol (COREG) 12.5 MG tablet and amLODipine (NORVASC) 10 MG tablet   Has the patient contacted their pharmacy? Yes (Agent: If no, request that the patient contact the pharmacy for the refill.)  [facility] [address] - [place], [place] - [address] [phone] (Phone) [phone] (Fax)   Preferred Pharmacy (with phone number or street name):    Agent: Please be advised that RX refills may take up to 3 business days. We ask that you follow-up with your pharmacy.

## 2017-05-08 NOTE — Progress Notes (Signed)
New Outpatient Visit Date: 05/09/2017  Referring Provider: Lorre Munroe, NP [address] [place], [place] [address]  Chief Complaint: Follow-up atrial fibrillation  HPI:  Mr. Warmuth is a 37 y.o. male who is being seen today for the evaluation of atrial fibrillatoin at the request of Ms. Baity. He has a history of paroxysmal atrial fibrillation, hypertension, and obstructive sleep apnea. Mr. Ruhe presents today to establish cardiology care. He reports a single episode of atrial fibrillation in 2015 while living in [place]. He was started on medical therapy with conversion back to sinus rhythm. He followed annually with Dr. Debbe Odea in [place], [place]. Plan reports undergoing echocardiogram and stress testing as recently as 2016. At the time of atrial fibrillation, he felt short of breath and had difficulty moving his extremities and tongue, almost as if he were having a stroke. His atrial fibrillation was attributed to taking frequent decongestants. He was on anticoagulation for several months, though this was subsequently stopped.  Since coming in [place] in 2016, Mr. Kissinger has been feeling well. He notes one episode of an uneasy feeling in his chest shortly after exercising at the gym during the last month. He does not describe frank pain but was concerned about what he noticed, as a close friend recently passed away from a heart attack at age 65. There were no palpitations or other symptoms reminiscent of his atrial fibrillation. He denies shortness of breath, lightheadedness, orthopnea, PND, and edema. He rarely consumes caffeine.  --------------------------------------------------------------------------------------------------  Cardiovascular History & Procedures: Cardiovascular Problems:  Paroxymal atrial fibrillation  Risk Factors:  Hypertension, male gender, and obesity  Cath/PCI:  None  CV Surgery:  None  EP Procedures and Devices:  None  Non-Invasive  Evaluation(s):  None available  Recent CV Pertinent Labs: Lab Results  Component Value Date   K 3.8 11/02/2016   BUN 11 11/02/2016   CREATININE 0.89 11/02/2016    --------------------------------------------------------------------------------------------------  Past Medical History:  Diagnosis Date  . Atrial fibrillation (HCC)   . Childhood asthma   . Hypertension   . Sleep apnea     Past Surgical History:  Procedure Laterality Date  . CHOLECYSTECTOMY N/A 11/08/2016   Procedure: LAPAROSCOPIC CHOLECYSTECTOMY;  Surgeon: Lattie Haw, MD;  Location: [facility] ORS;  Service: General;  Laterality: N/A;  . TONSILLECTOMY      Current Meds  Medication Sig  . amLODipine (NORVASC) 10 MG tablet Take 1 tablet (10 mg total) by mouth daily.  . carvedilol (COREG) 12.5 MG tablet Take 1 tablet (12.5 mg total) by mouth 2 (two) times daily.  Marland Kitchen loratadine (CLARITIN) 10 MG tablet Take 10 mg by mouth daily.  Marland Kitchen MAGNESIUM PO Take 1 tablet by mouth every other day. At night  . Multiple Vitamin (MULTIVITAMIN WITH MINERALS) TABS tablet Take 1 tablet by mouth 4 (four) times a week. Takes 3-4 times a week    Allergies: Patient has no known allergies.  Social History   Socioeconomic History  . Marital status: Married    Spouse name: Not on file  . Number of children: Not on file  . Years of education: Not on file  . Highest education level: Not on file  Social Needs  . Financial resource strain: Not on file  . Food insecurity - worry: Not on file  . Food insecurity - inability: Not on file  . Transportation needs - medical: Not on file  . Transportation needs - non-medical: Not on file  Occupational History  .  Not on file  Tobacco Use  . Smoking status: Former Smoker    Packs/day: 0.10    Years: 7.00    Pack years: 0.70    Types: Cigarettes    Last attempt to quit: 04/2003    Years since quitting: 14.0  . Smokeless tobacco: Never Used  Substance and Sexual Activity  . Alcohol use:  No  . Drug use: No    Comment: Marijuana as a teenager.  Marland Kitchen. Sexual activity: Yes  Other Topics Concern  . Not on file  Social History Narrative  . Not on file    Family History  Problem Relation Age of Onset  . Diabetes Father   . Mental illness Father   . Hypertension Maternal Grandfather     Review of Systems: A 12-system review of systems was performed and was negative except as noted in the HPI.  --------------------------------------------------------------------------------------------------  Physical Exam: BP 120/80 (BP Location: Right Arm, Patient Position: Sitting, Cuff Size: Large)   Pulse 68   Ht 6\' 1"  (1.854 m)   Wt 275 lb 12 oz (125.1 kg)   BMI 36.38 kg/m   General:  Obese man, seated comfortably in the exam room. HEENT: No conjunctival pallor or scleral icterus. Moist mucous membranes. OP clear. Neck: Supple without lymphadenopathy, thyromegaly, JVD, or HJR. No carotid bruit. Lungs: Normal work of breathing. Clear to auscultation bilaterally without wheezes or crackles. Heart: Regular rate and rhythm without murmurs, rubs, or gallops. Non-displaced PMI. Abd: Bowel sounds present. Soft, NT/ND without hepatosplenomegaly Ext: No lower extremity edema. Radial, PT, and DP pulses are 2+ bilaterally Skin: Warm and dry without rash. Neuro: CNIII-XII intact. Strength and fine-touch sensation intact in upper and lower extremities bilaterally. Psych: Normal mood and affect.  EKG:  Normal sinus rhythm with nonspecific T-wave abnormality.  Lab Results  Component Value Date   WBC 6.2 11/02/2016   HGB 14.3 11/02/2016   HCT 42.3 11/02/2016   MCV 82.4 11/02/2016   PLT 281 11/02/2016    Lab Results  Component Value Date   NA 141 11/02/2016   K 3.8 11/02/2016   CL 106 11/02/2016   CO2 27 11/02/2016   BUN 11 11/02/2016   CREATININE 0.89 11/02/2016   GLUCOSE 97 11/02/2016   ALT 34 11/02/2016    No results found for: CHOL, HDL, LDLCALC, LDLDIRECT, TRIG,  CHOLHDL   --------------------------------------------------------------------------------------------------  ASSESSMENT AND PLAN: Atypical chest pain Mr. Elnora MorrisonOlaye describes a vague uncomfortable sensation in his chest that occurred after exercising earlier this month. It has not recurred, but he is concerned that it could represent coronary artery disease. He notes that a friend of his recently passed away from a heart attack at age 37. His EKG today demonstrates nonspecific T-wave changes. His exam is unrevealing. We have agreed to perform an exercise tolerance test for further evaluation, as well as check a fasting lipid panel when he returns for the stress test for risk stratification.  Paroxysmal atrial fibrillation Mr. Elnora MorrisonOlaye was very symptomatic with his only episode of atrial fibrillation. I suspect he has not had any recent recurrences. EKG today demonstrates sinus rhythm. His CHADSVASC score is 1 (hypertension). I have suggested that Mr. Elnora MorrisonOlaye could take low-dose aspirin. I will request records from his prior cardiologist in [place], [place], as well.  Hypertension Blood pressure reasonably well controlled today. Continue amlodipine.  Follow-up: Return to clinic in 6 months, sooner if symptoms worsen or abnormalities are identified on stress test.  Yvonne Kendallhristopher Jaydi Bray, MD 05/09/2017 7:05 PM

## 2017-05-09 ENCOUNTER — Encounter: Payer: Self-pay | Admitting: Internal Medicine

## 2017-05-09 ENCOUNTER — Ambulatory Visit (INDEPENDENT_AMBULATORY_CARE_PROVIDER_SITE_OTHER): Payer: PRIVATE HEALTH INSURANCE | Admitting: Internal Medicine

## 2017-05-09 VITALS — BP 120/80 | HR 68 | Ht 73.0 in | Wt 275.8 lb

## 2017-05-09 DIAGNOSIS — I480 Paroxysmal atrial fibrillation: Secondary | ICD-10-CM

## 2017-05-09 DIAGNOSIS — R0789 Other chest pain: Secondary | ICD-10-CM | POA: Insufficient documentation

## 2017-05-09 DIAGNOSIS — Z13220 Encounter for screening for lipoid disorders: Secondary | ICD-10-CM | POA: Diagnosis not present

## 2017-05-09 DIAGNOSIS — I10 Essential (primary) hypertension: Secondary | ICD-10-CM

## 2017-05-09 MED ORDER — ASPIRIN EC 81 MG PO TBEC: 81.0000 mg | DELAYED_RELEASE_TABLET | Freq: Every day | ORAL | 3 refills | Status: DC

## 2017-05-09 NOTE — Patient Instructions (Signed)
Medication Instructions:  Your physician has recommended you make the following change in your medication:  1- Aspirin 81 mg by mouth once a day.   Labwork: Your physician recommends that you return for lab work in: on the morning of exercise tolerance test. (LIPID)  - You will need to be FASTING. - You may bring a snack to eat prior to stress test if needed to exercise.   Testing/Procedures: Your physician has requested that you have an exercise tolerance test. For further information please visit [email or web address] Please also follow instruction sheet, as given.    Follow-Up: Your physician wants you to follow-up in: 6 MONTHS WITH DR END. You will receive a reminder letter in the mail two months in advance. If you don't receive a letter, please call our office to schedule the follow-up appointment.  If you need a refill on your cardiac medications before your next appointment, please call your pharmacy.    Exercise Stress Electrocardiogram An exercise stress electrocardiogram is a test to check how blood flows to your heart. It is done to find areas of poor blood flow. You will need to walk on a treadmill for this test. The electrocardiogram will record your heartbeat when you are at rest and when you are exercising. What happens before the procedure?  Do not have drinks with caffeine or foods with caffeine for 24 hours before the test, or as told by your doctor. This includes coffee, tea (even decaf tea), sodas, chocolate, and cocoa.  Follow your doctor's instructions about eating and drinking before the test.  Ask your doctor what medicines you should or should not take before the test. Take your medicines with water unless told by your doctor not to.  If you use an inhaler, bring it with you to the test.  Bring a snack to eat after the test.  Do not  smoke for 4 hours before the test.  Do not put lotions, powders, creams, or oils on your chest before the test.  Wear  comfortable shoes and clothing. What happens during the procedure?  You will have patches put on your chest. Small areas of your chest may need to be shaved. Wires will be connected to the patches.  Your heart rate will be watched while you are resting and while you are exercising.  You will walk on the treadmill. The treadmill will slowly get faster to raise your heart rate.  The test will take about 1-2 hours. What happens after the procedure?  Your heart rate and blood pressure will be watched after the test.  You may return to your normal diet, activities, and medicines or as told by your doctor. This information is not intended to replace advice given to you by your health care provider. Make sure you discuss any questions you have with your health care provider. Document Released: 08/16/2007 Document Revised: 10/27/2015 Document Reviewed: 11/04/2012 Elsevier Interactive Patient Education  [employer]2018 Elsevier Inc.

## 2017-05-11 ENCOUNTER — Encounter: Payer: Self-pay | Admitting: Internal Medicine

## 2017-05-16 ENCOUNTER — Telehealth: Payer: Self-pay | Admitting: Internal Medicine

## 2017-05-16 NOTE — Telephone Encounter (Signed)
Patient scheduled 3/6 8:15am fasting labs and 8:30am GXT. Reviewed GXT and fasting lab instructions with patient who verbalized understanding.

## 2017-05-17 ENCOUNTER — Ambulatory Visit (INDEPENDENT_AMBULATORY_CARE_PROVIDER_SITE_OTHER): Payer: PRIVATE HEALTH INSURANCE

## 2017-05-17 ENCOUNTER — Other Ambulatory Visit (INDEPENDENT_AMBULATORY_CARE_PROVIDER_SITE_OTHER): Payer: PRIVATE HEALTH INSURANCE

## 2017-05-17 DIAGNOSIS — Z13220 Encounter for screening for lipoid disorders: Secondary | ICD-10-CM

## 2017-05-17 DIAGNOSIS — R0789 Other chest pain: Secondary | ICD-10-CM | POA: Diagnosis not present

## 2017-05-18 LAB — LIPID PANEL
Chol/HDL Ratio: 2.8 ratio (ref 0.0–5.0)
Cholesterol, Total: 163 mg/dL (ref 100–199)
HDL: 59 mg/dL (ref >39)
LDL Calculated: 92 mg/dL (ref 0–99)
TRIGLYCERIDES: 58 mg/dL (ref 0–149)
VLDL CHOLESTEROL CAL: 12 mg/dL (ref 5–40)

## 2017-05-19 LAB — EXERCISE TOLERANCE TEST
CHL RATE OF PERCEIVED EXERTION: 17
CSEPED: 9 min
Estimated workload: 11.2 METS
Exercise duration (sec): 41 s
MPHR: 160 {beats}/min
Peak HR: 160 {beats}/min
Percent HR: 86 %
Rest HR: 89 {beats}/min

## 2017-05-21 ENCOUNTER — Telehealth: Payer: Self-pay | Admitting: Internal Medicine

## 2017-05-22 NOTE — Telephone Encounter (Signed)
Results of exercise tolerance test and a lipid panel discussed with the patient by phone.  Given subtle inferolateral ST segment depression, we discussed the risks and benefits of further evaluation.  I suggested coronary CTA, though Mr. Elnora MorrisonOlaye believes he may have had one of these performed in [address]2015 in [place].  We will attempt to obtain records of this.  If his CTA was normal without stenosis or coronary artery calcification, I think it is reasonable to defer additional workup unless new symptoms arise.  Yvonne Kendallhristopher Lynsi Dooner, MD [facility] Pager: [phone]

## 2017-05-31 ENCOUNTER — Telehealth: Payer: Self-pay | Admitting: *Deleted

## 2017-05-31 NOTE — Telephone Encounter (Signed)
Re-faxed request for Medical Records from Dr Debbe OdeaUmad Ahmad in [place]. First request was faxed on 05/17/17. No records received at this time.

## 2017-06-03 NOTE — Progress Notes (Deleted)
[facility] Pulmonary Medicine Consultation      Assessment and Plan:  Excessive daytime sleepiness with symptoms and signs of obstructive sleep apnea. -Known obstructive sleep apnea, diagnosed in 2015, machine has since broke approximately 6 months ago. - Per insurance requirements we will send the patient for a new sleep study to requalify him for another CPAP machine.  Atrial fibrillation, essential hypertension. -Sleep apnea can contribute to the above conditions, therefore it is important to treat the underlying sleep apnea to help control atrial fibrillation and hypertension.   Date: 06/03/2017  MRN# [id number] Zachary Cruz 1980-07-01   Zachary Cruz is a 37 y.o. old male seen in consultation for chief complaint of:    No chief complaint on file.   HPI:   The patient is a 37 year old male with a history of obstructive sleep apnea.  At last visit the patient was noted to have obstructive sleep apnea, CPAP machine broke, therefore he was sent for a new sleep study. He works in Education administrator, he was previously on CPAP he thinks at a pressure of 9-14 cm H2O.   Social Hx:   Social History   Tobacco Use  . Smoking status: Former Smoker    Packs/day: 0.10    Years: 7.00    Pack years: 0.70    Types: Cigarettes    Last attempt to quit: 04/2003    Years since quitting: 14.1  . Smokeless tobacco: Never Used  Substance Use Topics  . Alcohol use: No  . Drug use: No    Comment: Marijuana as a teenager.   Medication:    Current Outpatient Medications:  .  amLODipine (NORVASC) 10 MG tablet, Take 1 tablet (10 mg total) by mouth daily., Disp: 90 tablet, Rfl: 3 .  aspirin EC 81 MG tablet, Take 1 tablet (81 mg total) by mouth daily., Disp: 90 tablet, Rfl: 3 .  carvedilol (COREG) 12.5 MG tablet, Take 1 tablet (12.5 mg total) by mouth 2 (two) times daily., Disp: 180 tablet, Rfl: 3 .  loratadine (CLARITIN) 10 MG tablet, Take 10 mg by mouth daily., Disp: , Rfl:  .  MAGNESIUM  PO, Take 1 tablet by mouth every other day. At night, Disp: , Rfl:  .  Multiple Vitamin (MULTIVITAMIN WITH MINERALS) TABS tablet, Take 1 tablet by mouth 4 (four) times a week. Takes 3-4 times a week, Disp: , Rfl:    Allergies:  Patient has no known allergies.  Review of Systems: Gen:  Denies  fever, sweats, chills HEENT: Denies blurred vision, double vision. bleeds, sore throat Cvc:  No dizziness, chest pain. Resp:   Denies cough or sputum production, shortness of breath Gi: Denies swallowing difficulty, stomach pain. Gu:  Denies bladder incontinence, burning urine Ext:   No Joint pain, stiffness. Skin: No skin rash,  hives  Endoc:  No polyuria, polydipsia. Psych: No depression, insomnia. Other:  All other systems were reviewed with the patient and were negative other that what is mentioned in the HPI.   Physical Examination:   VS: There were no vitals taken for this visit.  General Appearance: No distress  Neuro:without focal findings,  speech normal,  HEENT: PERRLA, EOM intact.  Mallampati 2 Pulmonary: normal breath sounds, No wheezing.  CardiovascularNormal S1,S2.  No m/r/g.   Abdomen: Benign, Soft, non-tender. Renal:  No costovertebral tenderness  GU:  No performed at this time. Endoc: No evident thyromegaly, no signs of acromegaly. Skin:   warm, no rashes, no ecchymosis  Extremities: normal, no  cyanosis, clubbing.  Other findings:    LABORATORY PANEL:   CBC No results for input(s): WBC, HGB, HCT, PLT in the last 168 hours. ------------------------------------------------------------------------------------------------------------------  Chemistries  No results for input(s): NA, K, CL, CO2, GLUCOSE, BUN, CREATININE, CALCIUM, MG, AST, ALT, ALKPHOS, BILITOT in the last 168 hours.  Invalid input(s): GFRCGP ------------------------------------------------------------------------------------------------------------------  Cardiac Enzymes No results for input(s):  TROPONINI in the last 168 hours. ------------------------------------------------------------  RADIOLOGY:  No results found.     Thank  you for the consultation and for allowing [facility] to assist in the care of your patient. Our recommendations are noted above.  Please contact us if we can be of further service.   Wells Guileseep Lido Maske, MD.  Board Certified in Internal Medicine, Pulmonary Medicine, Critical Care Medicine, and Sleep Medicine.  [facility] Office Number: [phone]  Santiago Gladavid Kasa, M.D.  Billy Fischeravid Simonds, M.D  06/03/2017

## 2017-06-04 ENCOUNTER — Ambulatory Visit: Payer: PRIVATE HEALTH INSURANCE | Admitting: Internal Medicine

## 2017-06-05 ENCOUNTER — Encounter: Payer: Self-pay | Admitting: Internal Medicine

## 2017-06-06 ENCOUNTER — Ambulatory Visit (INDEPENDENT_AMBULATORY_CARE_PROVIDER_SITE_OTHER): Payer: PRIVATE HEALTH INSURANCE | Admitting: Internal Medicine

## 2017-06-06 VITALS — BP 118/80 | HR 63 | Resp 16 | Ht 73.0 in | Wt 288.0 lb

## 2017-06-06 DIAGNOSIS — G4733 Obstructive sleep apnea (adult) (pediatric): Secondary | ICD-10-CM

## 2017-06-06 NOTE — Addendum Note (Signed)
Addended by: Janean SarkSNIPES, SONYA K on: 06/06/2017 12:27 PM   Modules accepted: Orders

## 2017-06-06 NOTE — Patient Instructions (Addendum)
Try to use your CPAP every night the whole night.  Try to go to bed earlier to get AT LEAST 6 hours per night.  Will change Auto CPAP pressure to 8-20

## 2017-06-06 NOTE — Progress Notes (Addendum)
Zachary Cruz Pulmonary Medicine Consultation      Assessment and Plan:  Obstructive sleep apnea. -Known obstructive sleep apnea, diagnosed in 2015, machine broke. - Per insurance requirements we will send the patient for a new sleep study to requalify him for another CPAP machine was positive. -Patient now started on a auto CPAP with range 5-20, with residual AHI of 0.4.  Doing very well.  Encouraged him to use CPAP every night for the whole night, and make it part of his normal sleep. -We will change auto pressure range to 8-20.  Daytime sleepiness. - Patient has some residual daytime sleepiness symptoms, likely due to inadequate sleep, he is currently only getting about 5-1/2 hours of sleep per night.  He notes that when he goes to bed earlier he feels more rested. -Discussed the importance of sleep hygiene, trying to go to bed earlier to get at least 6-8 hours of sleep per night.  Atrial fibrillation, essential hypertension. -Sleep apnea can contribute to the above conditions, therefore it is important to treat the underlying sleep apnea to help control atrial fibrillation and hypertension.   Date: 06/06/2017  MRN# [id number] Zachary Cruz [birth year]   Zachary Cruz is a 37 y.o. old male seen in consultation for chief complaint of:    Chief Complaint  Patient presents with  . Sleep Apnea    DME:[facility] patient has no complaints he wears cpap nightly at least 4 hrs.    HPI:   The patient is a 37 year old male with a history of obstructive sleep apnea.  At last visit the patient was noted to have obstructive sleep apnea, CPAP machine broke, therefore he was sent for a new sleep study, he was qualified for a new machine which he has been using nightly. He is feeling much better, he is no longer snoring he is on a nasal mask, he is more rested during the day. However he is only getting about 5.5 hours of sleep per night and notes that he is more rested when he goes to bed  earlier.   **Download data personally reviewed, 30 days as of 06/05/17.  Uses greater than 4 hours is 25/30 days.  Average usage on days used 5 hours 35 minutes.  Pressure ranges 5-20, mean pressure is 10, 95th percentile pressure 12, maximum pressure was 14.  Residual AHI is 0.4, showing excellent control.  Addendum 06/15/17; Review and summary of outside records.  **Patient seen at [facility] in August 2015, at that time was noted to have atrial fibrillation, echocardiogram showed mild pulmonary hypertension, left ventricular hypertrophy, EF 60%, treated with anticoagulation, Coreg.  Social Hx:   Social History   Tobacco Use  . Smoking status: Former Smoker    Packs/day: 0.10    Years: 7.00    Pack years: 0.70    Types: Cigarettes    Last attempt to quit: 04/2003    Years since quitting: 14.1  . Smokeless tobacco: Never Used  Substance Use Topics  . Alcohol use: No  . Drug use: No    Comment: Marijuana as a teenager.   Medication:    Current Outpatient Medications:  .  amLODipine (NORVASC) 10 MG tablet, Take 1 tablet (10 mg total) by mouth daily., Disp: 90 tablet, Rfl: 3 .  carvedilol (COREG) 12.5 MG tablet, Take 1 tablet (12.5 mg total) by mouth 2 (two) times daily., Disp: 180 tablet, Rfl: 3 .  loratadine (CLARITIN) 10 MG tablet, Take 10 mg by mouth daily.,  Disp: , Rfl:  .  MAGNESIUM PO, Take 1 tablet by mouth every other day. At night, Disp: , Rfl:  .  Multiple Vitamin (MULTIVITAMIN WITH MINERALS) TABS tablet, Take 1 tablet by mouth 4 (four) times a week. Takes 3-4 times a week, Disp: , Rfl:  .  aspirin EC 81 MG tablet, Take 1 tablet (81 mg total) by mouth daily. (Patient not taking: Reported on 06/06/2017), Disp: 90 tablet, Rfl: 3   Allergies:  Patient has no known allergies.  Review of Systems: Gen:  Denies  fever, sweats, chills HEENT: Denies blurred vision, double vision. bleeds, sore throat Cvc:  No dizziness, chest pain. Resp:   Denies cough or sputum  production, shortness of breath Gi: Denies swallowing difficulty, stomach pain. Gu:  Denies bladder incontinence, burning urine Ext:   No Joint pain, stiffness. Skin: No skin rash,  hives  Endoc:  No polyuria, polydipsia. Psych: No depression, insomnia. Other:  All other systems were reviewed with the patient and were negative other that what is mentioned in the HPI.   Physical Examination:   VS: BP 118/80 (BP Location: Left Arm, Cuff Size: Large)   Pulse 63   Resp 16   Ht 6\' 1"  (1.854 m)   Wt 288 lb (130.6 kg)   SpO2 97%   BMI 38.00 kg/m   General Appearance: No distress  Neuro:without focal findings,  speech normal,  HEENT: PERRLA, EOM intact.  Mallampati 2 Pulmonary: normal breath sounds, No wheezing.  CardiovascularNormal S1,S2.  No m/r/g.   Abdomen: Benign, Soft, non-tender. Renal:  No costovertebral tenderness  GU:  No performed at this time. Endoc: No evident thyromegaly, no signs of acromegaly. Skin:   warm, no rashes, no ecchymosis  Extremities: normal, no cyanosis, clubbing.  Other findings:    LABORATORY PANEL:   CBC No results for input(s): WBC, HGB, HCT, PLT in the last 168 hours. ------------------------------------------------------------------------------------------------------------------  Chemistries  No results for input(s): NA, K, CL, CO2, GLUCOSE, BUN, CREATININE, CALCIUM, MG, AST, ALT, ALKPHOS, BILITOT in the last 168 hours.  Invalid input(s): GFRCGP ------------------------------------------------------------------------------------------------------------------  Cardiac Enzymes No results for input(s): TROPONINI in the last 168 hours. ------------------------------------------------------------  RADIOLOGY:  No results found.     Thank  you for the consultation and for allowing [facility] to assist in the care of your patient. Our recommendations are noted above.  Please contact us if we can be of further  service.   Wells Guileseep Tamekia Rotter, MD.  Board Certified in Internal Medicine, Pulmonary Medicine, Critical Care Medicine, and Sleep Medicine.  [place] [facility] Office Number: [phone]  Santiago Gladavid Kasa, M.D.  Billy Fischeravid Simonds, M.D  06/06/2017

## 2017-06-11 NOTE — Telephone Encounter (Signed)
To Victorino DikeJennifer, RN for Dr. Okey DupreEnd and CMA's to see if any records have been received for this patient.

## 2017-06-11 NOTE — Telephone Encounter (Signed)
No records available yet. 

## 2017-06-11 NOTE — Telephone Encounter (Signed)
Pt calling to see if we received notes from previous provider  If so he wanted to know if we needed to change anything regarding how we will further treat him Please call back

## 2017-06-13 NOTE — Telephone Encounter (Signed)
S/w to Dr Rhoderick MoodyUhmad's office. Confirmed fax number was correct. Medical records for patient handled outside office since patient was there in 2015.  Called [phone] to confirm they had received the request.  Katrina stated they had faxed EKG and office notes on 05/28/17. We have not received anything yet. She said patient did not have a stress test but did have an echo and EKG at [facility]. She thinks she will be able to fax us the echo from there. She will refax any results, office visit notes, EKG, and echo results to us at [phone].

## 2017-06-13 NOTE — Telephone Encounter (Signed)
Called [facility] and s/w Medical Records. They will need signed request for medical records as well. She could not give out any other information regarding what testing patient had done there.  Request faxed to [facility] as well at [phone].

## 2017-06-14 NOTE — Telephone Encounter (Signed)
Records from Dr Rhoderick MoodyUhmad's office received and given to Dr End to review.

## 2017-06-15 NOTE — Addendum Note (Signed)
Addended by: Shane CrutchAMACHANDRAN, Geselle Cardosa on: 06/15/2017 09:20 AM   Modules accepted: Kipp BroodSmartSet

## 2017-06-26 ENCOUNTER — Encounter: Payer: Self-pay | Admitting: *Deleted

## 2017-06-26 ENCOUNTER — Telehealth: Payer: Self-pay | Admitting: *Deleted

## 2017-06-26 ENCOUNTER — Other Ambulatory Visit: Payer: Self-pay | Admitting: *Deleted

## 2017-06-26 DIAGNOSIS — R9439 Abnormal result of other cardiovascular function study: Secondary | ICD-10-CM

## 2017-06-26 DIAGNOSIS — Z01810 Encounter for preprocedural cardiovascular examination: Secondary | ICD-10-CM

## 2017-06-26 DIAGNOSIS — R0789 Other chest pain: Secondary | ICD-10-CM

## 2017-06-26 NOTE — Telephone Encounter (Signed)
Patient verbalized understanding of results and plan for coronary CTA. He said it was fine for me to send the instructions to his MyChart. Message sent to patient, precert and [name]. Order entered. He is aware it may a month before it may be scheduled.

## 2017-06-26 NOTE — Telephone Encounter (Signed)
-----   Message from Yvonne Kendallhristopher End, MD sent at 06/21/2017  7:10 AM EDT ----- I have reviewed outside records regarding prior cardiac w/u in [place].  Mr. Zachary Cruz thought that a cardiac CTA had been performed, though I see no mention of this in his prior cardiologist's notes.  Can we arrange for Mr. Zachary Cruz to have a coronary CTA for evaluation of atypical chest pain and abnormal exercise tolerance test?  Thanks.

## 2017-08-28 ENCOUNTER — Ambulatory Visit (HOSPITAL_COMMUNITY): Payer: 59

## 2017-08-28 ENCOUNTER — Telehealth: Payer: Self-pay | Admitting: Internal Medicine

## 2017-08-28 ENCOUNTER — Ambulatory Visit (HOSPITAL_COMMUNITY)
Admission: RE | Admit: 2017-08-28 | Discharge: 2017-08-28 | Disposition: A | Discharge status: Home / Self Care | Payer: 59 | Source: Ambulatory Visit | Attending: Internal Medicine | Admitting: Internal Medicine

## 2017-08-28 DIAGNOSIS — R079 Chest pain, unspecified: Secondary | ICD-10-CM | POA: Diagnosis not present

## 2017-08-28 DIAGNOSIS — R0789 Other chest pain: Secondary | ICD-10-CM | POA: Diagnosis present

## 2017-08-28 DIAGNOSIS — R9439 Abnormal result of other cardiovascular function study: Secondary | ICD-10-CM | POA: Diagnosis not present

## 2017-08-28 MED ORDER — IOPAMIDOL (ISOVUE-370) INJECTION 76%
100.0000 mL | Freq: Once | INTRAVENOUS | Status: AC | PRN
  Administered 2017-08-28: 80 mL via INTRAVENOUS

## 2017-08-28 MED ORDER — NITROGLYCERIN 0.4 MG SL SUBL
SUBLINGUAL_TABLET | SUBLINGUAL | Status: AC
  Filled 2017-08-28: qty 2

## 2017-08-28 MED ORDER — NITROGLYCERIN 0.4 MG SL SUBL
0.8000 mg | SUBLINGUAL_TABLET | SUBLINGUAL | Status: DC | PRN
  Administered 2017-08-28: 0.8 mg via SUBLINGUAL
  Filled 2017-08-28: qty 25

## 2017-08-28 MED ORDER — IOPAMIDOL (ISOVUE-370) INJECTION 76%
INTRAVENOUS | Status: AC
  Filled 2017-08-28: qty 100

## 2017-08-28 NOTE — Telephone Encounter (Signed)
Pt is requesting a copy of immunization records for new job.

## 2017-08-29 NOTE — Telephone Encounter (Signed)
Spoke to pt and he is aware that we do not have any immunizations on file nor did I see any on [facility]

## 2017-10-30 ENCOUNTER — Encounter: Payer: PRIVATE HEALTH INSURANCE | Admitting: Internal Medicine

## 2017-12-24 ENCOUNTER — Other Ambulatory Visit: Payer: Self-pay | Admitting: Internal Medicine

## 2017-12-24 NOTE — Telephone Encounter (Signed)
Patient calling and states that he is completely out of the amLODipine (NORVASC) 10 MG tablet. Advised of the refill policy. Patient request a message be sent to inform Rene Kocher that he is out of this medication.

## 2017-12-27 ENCOUNTER — Telehealth: Payer: Self-pay | Admitting: Internal Medicine

## 2017-12-27 NOTE — Telephone Encounter (Signed)
Ok to work him in, just not next to a new patient or another physical. 

## 2017-12-27 NOTE — Telephone Encounter (Signed)
See below crm Can pt be worked in  Copied from [employer] [id number]. Topic: General - Other >> Dec 27, 2017 12:02 PM Gaynelle Adu wrote: Reason for CRM: Patient is requesting a call in regards to having a physical schedule with Nicki Reaper within the next 30 days due to insurance. Advise the only appt available was in 12-19. He is requesting a call back in regards to this. Please advise

## 2017-12-27 NOTE — Telephone Encounter (Signed)
Left message asking pt to call office  °

## 2017-12-27 NOTE — Telephone Encounter (Signed)
Ok to work him in, just not next to a new patient or another physical.

## 2018-01-01 ENCOUNTER — Encounter: Payer: Self-pay | Admitting: Internal Medicine

## 2018-01-01 NOTE — Telephone Encounter (Signed)
appointmetn 10/31 pt aware

## 2018-01-10 ENCOUNTER — Ambulatory Visit (INDEPENDENT_AMBULATORY_CARE_PROVIDER_SITE_OTHER): Payer: 59 | Admitting: Internal Medicine

## 2018-01-10 ENCOUNTER — Encounter: Payer: Self-pay | Admitting: Internal Medicine

## 2018-01-10 VITALS — BP 122/80 | HR 62 | Temp 98.3°F | Ht 72.5 in | Wt 283.0 lb

## 2018-01-10 DIAGNOSIS — G4733 Obstructive sleep apnea (adult) (pediatric): Secondary | ICD-10-CM | POA: Insufficient documentation

## 2018-01-10 DIAGNOSIS — I10 Essential (primary) hypertension: Secondary | ICD-10-CM

## 2018-01-10 DIAGNOSIS — I480 Paroxysmal atrial fibrillation: Secondary | ICD-10-CM

## 2018-01-10 DIAGNOSIS — Z0000 Encounter for general adult medical examination without abnormal findings: Secondary | ICD-10-CM

## 2018-01-10 LAB — COMPREHENSIVE METABOLIC PANEL
ALK PHOS: 75 U/L (ref 39–117)
ALT: 24 U/L (ref 0–53)
AST: 18 U/L (ref 0–37)
Albumin: 4.2 g/dL (ref 3.5–5.2)
BUN: 11 mg/dL (ref 6–23)
CALCIUM: 8.8 mg/dL (ref 8.4–10.5)
CO2: 31 meq/L (ref 19–32)
Chloride: 104 mEq/L (ref 96–112)
Creatinine, Ser: 1 mg/dL (ref 0.40–1.50)
GFR: 108 mL/min (ref >60.00)
Glucose, Bld: 94 mg/dL (ref 70–99)
Potassium: 3.6 mEq/L (ref 3.5–5.1)
Sodium: 140 mEq/L (ref 135–145)
Total Bilirubin: 0.5 mg/dL (ref 0.2–1.2)
Total Protein: 7.4 g/dL (ref 6.0–8.3)

## 2018-01-10 LAB — CBC
HCT: 41.8 % (ref 39.0–52.0)
HEMOGLOBIN: 14.2 g/dL (ref 13.0–17.0)
MCHC: 33.8 g/dL (ref 30.0–36.0)
MCV: 81.5 fl (ref 78.0–100.0)
PLATELETS: 313 10*3/uL (ref 150.0–400.0)
RBC: 5.13 Mil/uL (ref 4.22–5.81)
RDW: 12.9 % (ref 11.5–15.5)
WBC: 5.6 10*3/uL (ref 4.0–10.5)

## 2018-01-10 LAB — LIPID PANEL
Cholesterol: 152 mg/dL (ref 0–200)
HDL: 50.8 mg/dL (ref >39.00)
LDL Cholesterol: 90 mg/dL (ref 0–99)
NonHDL: 101.03
TRIGLYCERIDES: 55 mg/dL (ref 0.0–149.0)
Total CHOL/HDL Ratio: 3
VLDL: 11 mg/dL (ref 0.0–40.0)

## 2018-01-10 LAB — HEMOGLOBIN A1C: Hgb A1c MFr Bld: 5.5 % (ref 4.6–6.5)

## 2018-01-10 NOTE — Assessment & Plan Note (Signed)
>>  ASSESSMENT AND PLAN FOR PAROXYSMAL ATRIAL FIBRILLATION (HCC) WRITTEN ON 01/10/2018  2:00 PM BY BAITY, REGINA W, NP  Continue Carvedilol and ASA Continue to follow with cardiology

## 2018-01-10 NOTE — Assessment & Plan Note (Signed)
Continue Carvedilol and ASA Continue to follow with cardiology

## 2018-01-10 NOTE — Patient Instructions (Signed)

## 2018-01-10 NOTE — Assessment & Plan Note (Signed)
Controlled on Amlodipine and Carvedilol CBC and CMET today Reinforced DASH diet

## 2018-01-10 NOTE — Assessment & Plan Note (Signed)
>>  ASSESSMENT AND PLAN FOR HTN (HYPERTENSION) WRITTEN ON 01/10/2018  2:00 PM BY BAITY, REGINA W, NP  Controlled on Amlodipine and Carvedilol CBC and CMET today Reinforced DASH diet

## 2018-01-10 NOTE — Assessment & Plan Note (Signed)
Continue CPAP Continue to follow up with pulmonology

## 2018-01-10 NOTE — Progress Notes (Signed)
Subjective:    Patient ID: Zachary Cruz, male    DOB: Aug 29, 1980, 37 y.o.   MRN: [id number]  HPI  Pt presents to the clinic today for his annual exam. He is also due to follow up chronic conditions.  Afib: Controlled on Carvedilol and ASA. He follows with Dr. Okey Dupre.  HTN: His BP today is 122/80. She is taking Amlodipine and Carvedilol as prescribed. ECG from 04/2017 reviewed.  OSA: He averages about 4-5 hours of sleep with his CPAP. He feels rested when he wakes up. Sleep study from 03/2017 reviewed. He follows with Dr. Nicholos Johns.   Flu: 01/02/2018 Tetanus: 2019 Dentist: biannually  Diet: He does eat meat. He consumes some fruits and veggies. He does eat fried foods. He drinks mostly water. Exercise: Cardio/weights 2 x week  Review of Systems      Past Medical History:  Diagnosis Date  . Atrial fibrillation (HCC)   . Childhood asthma   . Hypertension   . Sleep apnea     Current Outpatient Medications  Medication Sig Dispense Refill  . amLODipine (NORVASC) 10 MG tablet Take 1 tablet (10 mg total) by mouth daily. MUST SCHEDULE ANNUAL EXAM 30 tablet 0  . aspirin EC 81 MG tablet Take 1 tablet (81 mg total) by mouth daily. 90 tablet 3  . carvedilol (COREG) 12.5 MG tablet Take 1 tablet (12.5 mg total) by mouth 2 (two) times daily. MUST SCHEDULE ANNUAL EXAM 60 tablet 0  . loratadine (CLARITIN) 10 MG tablet Take 10 mg by mouth daily.    Marland Kitchen MAGNESIUM PO Take 1 tablet by mouth every other day. At night    . Multiple Vitamin (MULTIVITAMIN WITH MINERALS) TABS tablet Take 1 tablet by mouth 4 (four) times a week. Takes 3-4 times a week     No current facility-administered medications for this visit.     No Known Allergies  Family History  Problem Relation Age of Onset  . Diabetes Father   . Mental illness Father   . Hypertension Maternal Grandfather     Social History   Socioeconomic History  . Marital status: Married    Spouse name: Not on file  . Number of  children: Not on file  . Years of education: Not on file  . Highest education level: Not on file  Occupational History  . Not on file  Social Needs  . Financial resource strain: Not on file  . Food insecurity:    Worry: Not on file    Inability: Not on file  . Transportation needs:    Medical: Not on file    Non-medical: Not on file  Tobacco Use  . Smoking status: Former Smoker    Packs/day: 0.10    Years: 7.00    Pack years: 0.70    Types: Cigarettes    Last attempt to quit: 04/2003    Years since quitting: 14.7  . Smokeless tobacco: Never Used  Substance and Sexual Activity  . Alcohol use: No  . Drug use: No    Comment: Marijuana as a teenager.  Marland Kitchen Sexual activity: Yes  Lifestyle  . Physical activity:    Days per week: Not on file    Minutes per session: Not on file  . Stress: Not on file  Relationships  . Social connections:    Talks on phone: Not on file    Gets together: Not on file    Attends religious service: Not on file    Active member of  club or organization: Not on file    Attends meetings of clubs or organizations: Not on file    Relationship status: Not on file  . Intimate partner violence:    Fear of current or ex partner: Not on file    Emotionally abused: Not on file    Physically abused: Not on file    Forced sexual activity: Not on file  Other Topics Concern  . Not on file  Social History Narrative  . Not on file     Constitutional: Denies fever, malaise, fatigue, headache or abrupt weight changes.  HEENT: Denies eye pain, eye redness, ear pain, ringing in the ears, wax buildup, runny nose, nasal congestion, bloody nose, or sore throat. Respiratory: Denies difficulty breathing, shortness of breath, cough or sputum production.   Cardiovascular: Denies chest pain, chest tightness, palpitations or swelling in the hands or feet.  Gastrointestinal: Denies abdominal pain, bloating, constipation, diarrhea or blood in the stool.  GU: Denies urgency,  frequency, pain with urination, burning sensation, blood in urine, odor or discharge. Musculoskeletal: Denies decrease in range of motion, difficulty with gait, muscle pain or joint pain and swelling.  Skin: Denies redness, rashes, lesions or ulcercations.  Neurological: Denies dizziness, difficulty with memory, difficulty with speech or problems with balance and coordination.  Psych: Denies anxiety, depression, SI/HI.  No other specific complaints in a complete review of systems (except as listed in HPI above).  Objective:   Physical Exam   BP 122/80   Pulse 62   Temp 98.3 F (36.8 C) (Oral)   Ht 6' 0.5" (1.842 m)   Wt 283 lb (128.4 kg)   SpO2 98%   BMI 37.85 kg/m  Wt Readings from Last 3 Encounters:  01/10/18 283 lb (128.4 kg)  06/06/17 288 lb (130.6 kg)  05/09/17 275 lb 12 oz (125.1 kg)    General: Appears his stated age, obese, in NAD. Skin: Warm, dry and intact.  HEENT: Head: normal shape and size; Eyes: sclera white, no icterus, conjunctiva pink, PERRLA and EOMs intact; Ears: Tm's gray and intact, normal light reflex; Throat/Mouth: Teeth present, mucosa pink and moist, no exudate, lesions or ulcerations noted.  Neck:  Neck supple, trachea midline. No masses, lumps or thyromegaly present.  Cardiovascular: Normal rate and rhythm. S1,S2 noted.  No murmur, rubs or gallops noted. No JVD or BLE edema. Pulmonary/Chest: Normal effort and positive vesicular breath sounds. No respiratory distress. No wheezes, rales or ronchi noted.  Abdomen: Soft and nontender. Normal bowel sounds. No distention or masses noted. Liver, spleen and kidneys non palpable. Musculoskeletal: Strength 5/5 BUE/BLE.Marland Kitchen No difficulty with gait.  Neurological: Alert and oriented. Cranial nerves II-XII grossly intact. Coordination normal.  Psychiatric: Mood and affect normal. Behavior is normal. Judgment and thought content normal.     BMET    Component Value Date/Time   NA 141 11/02/2016 1043   K 3.8  11/02/2016 1043   CL 106 11/02/2016 1043   CO2 27 11/02/2016 1043   GLUCOSE 97 11/02/2016 1043   BUN 11 11/02/2016 1043   CREATININE 0.89 11/02/2016 1043   CALCIUM 9.1 11/02/2016 1043   GFRNONAA >60 11/02/2016 1043   GFRAA >60 11/02/2016 1043    Lipid Panel     Component Value Date/Time   CHOL 163 05/17/2017 0819   TRIG 58 05/17/2017 0819   HDL 59 05/17/2017 0819   CHOLHDL 2.8 05/17/2017 0819   LDLCALC 92 05/17/2017 0819    CBC    Component Value Date/Time  WBC 6.2 11/02/2016 1043   RBC 5.13 11/02/2016 1043   HGB 14.3 11/02/2016 1043   HCT 42.3 11/02/2016 1043   PLT 281 11/02/2016 1043   MCV 82.4 11/02/2016 1043   MCH 27.8 11/02/2016 1043   MCHC 33.8 11/02/2016 1043   RDW 12.7 11/02/2016 1043   LYMPHSABS 2.2 11/02/2016 1043   MONOABS 0.5 11/02/2016 1043   EOSABS 0.5 11/02/2016 1043   BASOSABS 0.0 11/02/2016 1043    Hgb A1C No results found for: HGBA1C         Assessment & Plan:   Preventative Health Maintenance:  Flu and tetanus UTD Encouraged him to consume a balanced diet and exercise regimen Advised him to see an eye doctor and dentist annually Will check CBC, CMET, Lipid and A1C today  RTC in 1 year, sooner if needed Nicki Reaper, NP

## 2018-01-27 ENCOUNTER — Other Ambulatory Visit: Payer: Self-pay | Admitting: Internal Medicine

## 2018-01-29 ENCOUNTER — Other Ambulatory Visit: Payer: Self-pay | Admitting: Internal Medicine

## 2018-04-26 ENCOUNTER — Encounter: Payer: Self-pay | Admitting: Internal Medicine

## 2018-04-26 ENCOUNTER — Ambulatory Visit: Payer: 59 | Admitting: Internal Medicine

## 2018-04-26 VITALS — BP 144/88 | HR 70 | Ht 73.0 in | Wt 296.0 lb

## 2018-04-26 DIAGNOSIS — R0789 Other chest pain: Secondary | ICD-10-CM

## 2018-04-26 DIAGNOSIS — I10 Essential (primary) hypertension: Secondary | ICD-10-CM | POA: Diagnosis not present

## 2018-04-26 DIAGNOSIS — I480 Paroxysmal atrial fibrillation: Secondary | ICD-10-CM

## 2018-04-26 NOTE — Progress Notes (Signed)
Follow-up Outpatient Visit Date: 04/26/2018  Primary Care Provider: Jearld Fenton, NP [place] [address]  Chief Complaint: Follow-up paroxysmal atrial fibrillation and hypertension  HPI:  Mr. Zachary Cruz is a 38 y.o. year-old male with history of paroxysmal atrial fibrillation, hypertension, and obstructive sleep apnea, who presents for follow-up of atrial fibrillation.  I met him a year ago for initial evaluation of atrial fibrillation that was diagnosed while he was living in [place].  He reported an uncomfortable sensation in his chest a month before our visit.  Subsequent exercise tolerance test was intermediate risk with 1 mm horizontal ST segment depressions in the inferolateral leads.  Subsequent cardiac CTA showed clean coronaries without plaque or calcification.  Today, patient reports he has been doing well.  He denies chest pain, shortness of breath, palpitations, lightheadedness, and edema.  He is using CPAP regularly.  He attributes his elevated blood pressure today to having run out of amlodipine 2 days ago.  He missed yesterday and this morning's doses.  He plans to pick up his refill later today.  He continues to use CPAP nightly.  He attributes weight gain over the last year to dietary indiscretion.  --------------------------------------------------------------------------------------------------  Cardiovascular History & Procedures: Cardiovascular Problems:  Paroxymal atrial fibrillation  Risk Factors:  Hypertension, male gender, and obesity  Cath/PCI:  None  CV Surgery:  None  EP Procedures and Devices:  None  Non-Invasive Evaluation(s):  Cardiac CTA (08/28/2017): No plaque or stenosis in the coronary arteries.  CAC 0.  No significant extracardiac findings in the thorax.  Exercise tolerance test (05/17/2017): Immediate risk study with 1 mm horizontal ST depression in the inferolateral leads.  Rare PVCs during stress.  Good exercise  capacity.  TTE (08/12/2013, [facility]): Normal LV size with mild LVH.  LVEF 60% with normal wall motion.  Normal RV size and function.  Trivial MR.  Recent CV Pertinent Labs: Lab Results  Component Value Date   CHOL 152 01/10/2018   CHOL 163 05/17/2017   HDL 50.80 01/10/2018   HDL 59 05/17/2017   LDLCALC 90 01/10/2018   LDLCALC 92 05/17/2017   TRIG 55.0 01/10/2018   CHOLHDL 3 01/10/2018   K 3.6 01/10/2018   BUN 11 01/10/2018   CREATININE 1.00 01/10/2018    Past medical and surgical history were reviewed and updated in EPIC.  Current Meds  Medication Sig  . amLODipine (NORVASC) 10 MG tablet Take 1 tablet (10 mg total) by mouth daily. MUST SCHEDULE ANNUAL EXAM  . carvedilol (COREG) 12.5 MG tablet Take 1 tablet (12.5 mg total) by mouth 2 (two) times daily with a meal.  . loratadine (CLARITIN) 10 MG tablet Take 10 mg by mouth daily.  . Multiple Vitamin (MULTIVITAMIN WITH MINERALS) TABS tablet Take 1 tablet by mouth 4 (four) times a week. Takes 3-4 times a week  . [DISCONTINUED] aspirin EC 81 MG tablet Take 1 tablet (81 mg total) by mouth daily.    Allergies: Patient has no known allergies.  Social History   Tobacco Use  . Smoking status: Former Smoker    Packs/day: 0.10    Years: 7.00    Pack years: 0.70    Types: Cigarettes    Last attempt to quit: 04/2003    Years since quitting: 15.0  . Smokeless tobacco: Never Used  Substance Use Topics  . Alcohol use: No  . Drug use: No    Comment: Marijuana as a teenager.    Family History  Problem Relation Age of Onset  . Diabetes Father   . Mental illness Father   . Hypertension Maternal Grandfather     Review of Systems: A 12-system review of systems was performed and was negative except as noted in the HPI.  --------------------------------------------------------------------------------------------------  Physical Exam: BP (!) 144/88 (BP Location: Left Arm, Patient Position: Sitting, Cuff Size:  Normal)   Pulse 70   Ht _0  (1.854 m)   Wt 296 lb (134.3 kg)   BMI 39.05 kg/m   General: NAD. HEENT: No conjunctival pallor or scleral icterus. Moist mucous membranes.  OP clear. Neck: Supple without lymphadenopathy, thyromegaly, JVD, or HJR. Lungs: Normal work of breathing. Clear to auscultation bilaterally without wheezes or crackles. Heart: Regular rate and rhythm without murmurs, rubs, or gallops. Non-displaced PMI. Abd: Bowel sounds present. Soft, NT/ND without hepatosplenomegaly Ext: No lower extremity edema. Radial, PT, and DP pulses are 2+ bilaterally. Skin: Warm and dry without rash.  EKG: Normal sinus rhythm with single PVC.  Borderline LVH with nonspecific T wave changes.  Lab Results  Component Value Date   WBC 5.6 01/10/2018   HGB 14.2 01/10/2018   HCT 41.8 01/10/2018   MCV 81.5 01/10/2018   PLT 313.0 01/10/2018    Lab Results  Component Value Date   NA 140 01/10/2018   K 3.6 01/10/2018   CL 104 01/10/2018   CO2 31 01/10/2018   BUN 11 01/10/2018   CREATININE 1.00 01/10/2018   GLUCOSE 94 01/10/2018   ALT 24 01/10/2018    Lab Results  Component Value Date   CHOL 152 01/10/2018   HDL 50.80 01/10/2018   LDLCALC 90 01/10/2018   TRIG 55.0 01/10/2018   CHOLHDL 3 01/10/2018    --------------------------------------------------------------------------------------------------  ASSESSMENT AND PLAN: Paroxysmal atrial fibrillation No symptoms to suggest recurrence.  EKG today demonstrates sinus rhythm with isolated PVC.  Given CHADSVASC score of 1 (hypertension) and no evidence of recurrence for a few years, I have recommended discontinuation of aspirin.  We will continue current dose of carvedilol (12.5 mg twice daily).  Hypertension Blood pressure mildly elevated today, likely due to missed doses of amlodipine.  I have encouraged Mr. Ala Dach to restart amlodipine and continue current dose of carvedilol.  Chest pain No recurrence since our last visit.   Cardiac CTA was normal without CAD or coronary artery calcium.  We have agreed to discontinue aspirin.  We will focus on primary prevention through blood pressure control and weight loss.  Follow-up: Return to clinic in 1 year.  Nelva Bush, MD 04/26/2018 11:28 AM

## 2018-04-26 NOTE — Patient Instructions (Signed)
Medication Instructions:  Your physician has recommended you make the following change in your medication:  1- STOP aspirin.  If you need a refill on your cardiac medications before your next appointment, please call your pharmacy.   Lab work: none If you have labs (blood work) drawn today and your tests are completely normal, you will receive your results only by: Marland Kitchen MyChart Message (if you have MyChart) OR . A paper copy in the mail If you have any lab test that is abnormal or we need to change your treatment, we will call you to review the results.  Testing/Procedures: none  Follow-Up: At [facility], you and your health needs are our priority.  As part of our continuing mission to provide you with exceptional heart care, we have created designated Provider Care Teams.  These Care Teams include your primary Cardiologist (physician) and Advanced Practice Providers (APPs -  Physician Assistants and Nurse Practitioners) who all work together to provide you with the care you need, when you need it. You will need a follow up appointment in 12 months.  Please call our office 2 months in advance to schedule this appointment.  You may see DR Cristal Deer END or one of the following Advanced Practice Providers on your designated Care Team:   Nicolasa Ducking, NP Eula Listen, PA-C . Marisue Ivan, PA-C  Any Other Special Instructions Will Be Listed Below (If Applicable).    DASH Eating Plan DASH stands for "Dietary Approaches to Stop Hypertension." The DASH eating plan is a healthy eating plan that has been shown to reduce high blood pressure (hypertension). It may also reduce your risk for type 2 diabetes, heart disease, and stroke. The DASH eating plan may also help with weight loss. What are tips for following this plan?  General guidelines  Avoid eating more than 2,300 mg (milligrams) of salt (sodium) a day. If you have hypertension, you may need to reduce your sodium intake to 1,500  mg a day.  Limit alcohol intake to no more than 1 drink a day for nonpregnant women and 2 drinks a day for men. One drink equals 12 oz of beer, 5 oz of wine, or 1 oz of hard liquor.  Work with your health care provider to maintain a healthy body weight or to lose weight. Ask what an ideal weight is for you.  Get at least 30 minutes of exercise that causes your heart to beat faster (aerobic exercise) most days of the week. Activities may include walking, swimming, or biking.  Work with your health care provider or diet and nutrition specialist (dietitian) to adjust your eating plan to your individual calorie needs. Reading food labels   Check food labels for the amount of sodium per serving. Choose foods with less than 5 percent of the Daily Value of sodium. Generally, foods with less than 300 mg of sodium per serving fit into this eating plan.  To find whole grains, look for the word "whole" as the first word in the ingredient list. Shopping  Buy products labeled as "low-sodium" or "no salt added."  Buy fresh foods. Avoid canned foods and premade or frozen meals. Cooking  Avoid adding salt when cooking. Use salt-free seasonings or herbs instead of table salt or sea salt. Check with your health care provider or pharmacist before using salt substitutes.  Do not fry foods. Cook foods using healthy methods such as baking, boiling, grilling, and broiling instead.  Cook with heart-healthy oils, such as olive, canola, soybean,  or sunflower oil. Meal planning  Eat a balanced diet that includes: ? 5 or more servings of fruits and vegetables each day. At each meal, try to fill half of your plate with fruits and vegetables. ? Up to 6-8 servings of whole grains each day. ? Less than 6 oz of lean meat, poultry, or fish each day. A 3-oz serving of meat is about the same size as a deck of cards. One egg equals 1 oz. ? 2 servings of low-fat dairy each day. ? A serving of nuts, seeds, or beans 5  times each week. ? Heart-healthy fats. Healthy fats called Omega-3 fatty acids are found in foods such as flaxseeds and coldwater fish, like sardines, salmon, and mackerel.  Limit how much you eat of the following: ? Canned or prepackaged foods. ? Food that is high in trans fat, such as fried foods. ? Food that is high in saturated fat, such as fatty meat. ? Sweets, desserts, sugary drinks, and other foods with added sugar. ? Full-fat dairy products.  Do not salt foods before eating.  Try to eat at least 2 vegetarian meals each week.  Eat more home-cooked food and less restaurant, buffet, and fast food.  When eating at a restaurant, ask that your food be prepared with less salt or no salt, if possible. What foods are recommended? The items listed may not be a complete list. Talk with your dietitian about what dietary choices are best for you. Grains Whole-grain or whole-wheat bread. Whole-grain or whole-wheat pasta. Brown rice. Orpah Cobb. Bulgur. Whole-grain and low-sodium cereals. Pita bread. Low-fat, low-sodium crackers. Whole-wheat flour tortillas. Vegetables Fresh or frozen vegetables (raw, steamed, roasted, or grilled). Low-sodium or reduced-sodium tomato and vegetable juice. Low-sodium or reduced-sodium tomato sauce and tomato paste. Low-sodium or reduced-sodium canned vegetables. Fruits All fresh, dried, or frozen fruit. Canned fruit in natural juice (without added sugar). Meat and other protein foods Skinless chicken or [place]. Ground chicken or [place]. Pork with fat trimmed off. Fish and seafood. Egg whites. Dried beans, peas, or lentils. Unsalted nuts, nut butters, and seeds. Unsalted canned beans. Lean cuts of beef with fat trimmed off. Low-sodium, lean deli meat. Dairy Low-fat (1%) or fat-free (skim) milk. Fat-free, low-fat, or reduced-fat cheeses. Nonfat, low-sodium ricotta or cottage cheese. Low-fat or nonfat yogurt. Low-fat, low-sodium cheese. Fats and oils Soft  margarine without trans fats. Vegetable oil. Low-fat, reduced-fat, or light mayonnaise and salad dressings (reduced-sodium). Canola, safflower, olive, soybean, and sunflower oils. Avocado. Seasoning and other foods Herbs. Spices. Seasoning mixes without salt. Unsalted popcorn and pretzels. Fat-free sweets. What foods are not recommended? The items listed may not be a complete list. Talk with your dietitian about what dietary choices are best for you. Grains Baked goods made with fat, such as croissants, muffins, or some breads. Dry pasta or rice meal packs. Vegetables Creamed or fried vegetables. Vegetables in a cheese sauce. Regular canned vegetables (not low-sodium or reduced-sodium). Regular canned tomato sauce and paste (not low-sodium or reduced-sodium). Regular tomato and vegetable juice (not low-sodium or reduced-sodium). Rosita Fire. Olives. Fruits Canned fruit in a light or heavy syrup. Fried fruit. Fruit in cream or butter sauce. Meat and other protein foods Fatty cuts of meat. Ribs. Fried meat. Tomasa Blase. Sausage. Bologna and other processed lunch meats. Salami. Fatback. Hotdogs. Bratwurst. Salted nuts and seeds. Canned beans with added salt. Canned or smoked fish. Whole eggs or egg yolks. Chicken or [place] with skin. Dairy Whole or 2% milk, cream, and half-and-half. Whole or full-fat  cream cheese. Whole-fat or sweetened yogurt. Full-fat cheese. Nondairy creamers. Whipped toppings. Processed cheese and cheese spreads. Fats and oils Butter. Stick margarine. Lard. Shortening. Ghee. Bacon fat. Tropical oils, such as coconut, palm kernel, or palm oil. Seasoning and other foods Salted popcorn and pretzels. Onion salt, garlic salt, seasoned salt, table salt, and sea salt. Worcestershire sauce. Tartar sauce. Barbecue sauce. Teriyaki sauce. Soy sauce, including reduced-sodium. Steak sauce. Canned and packaged gravies. Fish sauce. Oyster sauce. Cocktail sauce. Horseradish that you find on the shelf.  Ketchup. Mustard. Meat flavorings and tenderizers. Bouillon cubes. Hot sauce and Tabasco sauce. Premade or packaged marinades. Premade or packaged taco seasonings. Relishes. Regular salad dressings. Where to find more information:  National Heart, Lung, and Blood Institute: PopSteam.iswww.nhlbi.nih.gov  American Heart Association: www.heart.org Summary  The DASH eating plan is a healthy eating plan that has been shown to reduce high blood pressure (hypertension). It may also reduce your risk for type 2 diabetes, heart disease, and stroke.  With the DASH eating plan, you should limit salt (sodium) intake to 2,300 mg a day. If you have hypertension, you may need to reduce your sodium intake to 1,500 mg a day.  When on the DASH eating plan, aim to eat more fresh fruits and vegetables, whole grains, lean proteins, low-fat dairy, and heart-healthy fats.  Work with your health care provider or diet and nutrition specialist (dietitian) to adjust your eating plan to your individual calorie needs. This information is not intended to replace advice given to you by your health care provider. Make sure you discuss any questions you have with your health care provider. Document Released: 02/16/2011 Document Revised: 02/21/2016 Document Reviewed: 02/21/2016 Elsevier Interactive Patient Education  2019 [employer]      Exercising to Stay Healthy To become healthy and stay healthy, it is recommended that you do moderate-intensity and vigorous-intensity exercise. You can tell that you are exercising at a moderate intensity if your heart starts beating faster and you start breathing faster but can still hold a conversation. You can tell that you are exercising at a vigorous intensity if you are breathing much harder and faster and cannot hold a conversation while exercising. Exercising regularly is important. It has many health benefits, such as:  Improving overall fitness, flexibility, and endurance.  Increasing  bone density.  Helping with weight control.  Decreasing body fat.  Increasing muscle strength.  Reducing stress and tension.  Improving overall health. How often should I exercise? Choose an activity that you enjoy, and set realistic goals. Your health care provider can help you make an activity plan that works for you. Exercise regularly as told by your health care provider. This may include:  Doing strength training two times a week, such as: ? Lifting weights. ? Using resistance bands. ? Push-ups. ? Sit-ups. ? Yoga.  Doing a certain intensity of exercise for a given amount of time. Choose from these options: ? A total of 150 minutes of moderate-intensity exercise every week. ? A total of 75 minutes of vigorous-intensity exercise every week. ? A mix of moderate-intensity and vigorous-intensity exercise every week. Children, pregnant women, people who have not exercised regularly, people who are overweight, and older adults may need to talk with a health care provider about what activities are safe to do. If you have a medical condition, be sure to talk with your health care provider before you start a new exercise program. What are some exercise ideas? Moderate-intensity exercise ideas include:  Walking 1 mile (  1.6 km) in about 15 minutes.  Biking.  Hiking.  Golfing.  Dancing.  Water aerobics. Vigorous-intensity exercise ideas include:  Walking 4.5 miles (7.2 km) or more in about 1 hour.  Jogging or running 5 miles (8 km) in about 1 hour.  Biking 10 miles (16.1 km) or more in about 1 hour.  Lap swimming.  Roller-skating or in-line skating.  Cross-country skiing.  Vigorous competitive sports, such as football, basketball, and soccer.  Jumping rope.  Aerobic dancing. What are some everyday activities that can help me to get exercise?  Yard work, such as: ? Pushing a Surveyor, mining. ? Raking and bagging leaves.  Washing your car.  Pushing a  stroller.  Shoveling snow.  Gardening.  Washing windows or floors. How can I be more active in my day-to-day activities?  Use stairs instead of an elevator.  Take a walk during your lunch break.  If you drive, park your car farther away from your work or school.  If you take public transportation, get off one stop early and walk the rest of the way.  Stand up or walk around during all of your indoor phone calls.  Get up, stretch, and walk around every 30 minutes throughout the day.  Enjoy exercise with a friend. Support to continue exercising will help you keep a regular routine of activity. What guidelines can I follow while exercising?  Before you start a new exercise program, talk with your health care provider.  Do not exercise so much that you hurt yourself, feel dizzy, or get very short of breath.  Wear comfortable clothes and wear shoes with good support.  Drink plenty of water while you exercise to prevent dehydration or heat stroke.  Work out until your breathing and your heartbeat get faster. Where to find more information  U.S. Department of Health and Human Services: ThisPath.fi  Centers for Disease Control and Prevention (CDC): FootballExhibition.com.br Summary  Exercising regularly is important. It will improve your overall fitness, flexibility, and endurance.  Regular exercise also will improve your overall health. It can help you control your weight, reduce stress, and improve your bone density.  Do not exercise so much that you hurt yourself, feel dizzy, or get very short of breath.  Before you start a new exercise program, talk with your health care provider. This information is not intended to replace advice given to you by your health care provider. Make sure you discuss any questions you have with your health care provider. Document Released: 04/01/2010 Document Revised: 01/18/2017 Document Reviewed: 01/18/2017 Elsevier Interactive Patient Education  2019  [employer].

## 2018-08-13 ENCOUNTER — Telehealth: Payer: Self-pay | Admitting: Internal Medicine

## 2018-08-13 NOTE — Telephone Encounter (Signed)
Patient states he has heard that African Americans are higher risk for COVID-19 and blood clots and asks if he should start taking his Aspirin. Please call to advise.

## 2018-08-13 NOTE — Telephone Encounter (Signed)
Called patient and he verbalized understanding of Dr Serita Kyle recommendations and was appreciative.

## 2018-08-13 NOTE — Telephone Encounter (Signed)
I do not see a strong indication for Mr. Tapley to start aspirin from a cardiac standpoint (PAF with CHADSVASC score of 1 and cardiac CTA with normal coronaries).  However, if he wishes to begin ASA 81 mg for primary prevention, he could consider this.  He may wish to discuss prothrombotic concerns related to COVID-19 with his PCP.  Yvonne Kendall, MD [facility] Pager: [phone]

## 2018-08-13 NOTE — Telephone Encounter (Signed)
Please advise 

## 2018-09-27 ENCOUNTER — Ambulatory Visit (INDEPENDENT_AMBULATORY_CARE_PROVIDER_SITE_OTHER): Payer: 59 | Admitting: Primary Care

## 2018-09-27 ENCOUNTER — Telehealth: Payer: Self-pay

## 2018-09-27 VITALS — Temp 96.5°F | Ht 73.0 in | Wt 296.0 lb

## 2018-09-27 DIAGNOSIS — J029 Acute pharyngitis, unspecified: Secondary | ICD-10-CM | POA: Diagnosis not present

## 2018-09-27 NOTE — Telephone Encounter (Signed)
Per chart review tab pt had visit with Gentry Fitz NP earlier today.

## 2018-09-27 NOTE — Telephone Encounter (Signed)
Patient evaluated, Covid testing pending.

## 2018-09-27 NOTE — Telephone Encounter (Signed)
[facility] - Client Nonclinical Telephone Record AccessNurse Client [place] Night - Client Client Site [place] - Night Physician AA - PHYSICIAN, Verita Schneiders- MD Contact Type Call Who Is Calling Patient / Member / Family / Caregiver Caller Name [name] Phone Number [phone] Patient Name Zachary Cruz Patient DOB 1980-10-14 Call Type Message Only Information Provided Reason for Call Request to Schedule Office Appointment Initial Comment needs to get covid appt. he is at [facility], told that the dr must set up the appt. pls call back Additional Comment Call Closed By: Donato Heinz Transaction Date/Time: 09/27/2018 7:37:17 AM (ET)

## 2018-09-27 NOTE — Assessment & Plan Note (Signed)
Symptoms likely allergy related, doesn't appear sickly. Given continued symptoms and his line of work we will need to rule out Covid-19. Discussed treatment for allergies. Lab orders placed for Covid testing. He will proceed to [facility] for drive through testing.

## 2018-09-27 NOTE — Progress Notes (Signed)
Subjective:    Patient ID: Zachary Cruz, male    DOB: 05/01/1980, 38 y.o.   MRN: [id number]  HPI  Virtual Visit via Video Note  I connected with Zachary Cruz on 09/27/18 at  9:20 AM EDT by a video enabled telemedicine application and verified that I am speaking with the correct person using two identifiers.  Location: Patient: Home Provider: Office   I discussed the limitations of evaluation and management by telemedicine and the availability of in person appointments. The patient expressed understanding and agreed to proceed.  History of Present Illness:  Zachary Cruz is a 38 year old male with a history of paroxysmal atrial fibrillation, hypertension, OSA, seasonal allergies who presents today with a chief complaint of sore throat.  He was outdoors in the sun about 6 days ago, noticed sore throat at that time. A sore throat isn't unusual for him given his allergies, it will last for 2 days on average. Since then his sore throat has continued, woke up this morning and his sore throat was worse. Also with muscle aches around neck and shoulders with some fatigue, postnasal drip, rhinorrhea, nasal congestion, sinus pressure.Marland Kitchen.   He denies fevers, cough. He is compliant to Claritin daily, also took Theraflu last night with temporary improvement. He works part time as a TEFL teacherChaplin for the hospital and has been wearing appropriate PPE while at work. No known exposure to Covid.   Observations/Objective:  Alert and oriented. Appears well, not sickly. No distress. Speaking in complete sentences.   Assessment and Plan:  Symptoms likely allergy related, doesn't appear sickly. Given continued symptoms and his line of work we will need to rule out Covid-19. Discussed treatment for allergies. Lab orders placed for Covid testing. [facility].  Follow Up Instructions:  Present to the [facility] before 3:30 pm today for  Covid testing.  Nasal Congestion/Ear Pressure/Sinus Pressure: Try using Flonase (fluticasone) nasal spray. Instill 1 spray in each nostril twice daily.   Consider switching from Claritin to Zyrtec for allergies.  I will be in touch with your results once received.  It was a pleasure meeting you! Mayra ReelKate Boni Maclellan, NP-C    I discussed the assessment and treatment plan with the patient. The patient was provided an opportunity to ask questions and all were answered. The patient agreed with the plan and demonstrated an understanding of the instructions.   The patient was advised to call back or seek an in-person evaluation if the symptoms worsen or if the condition fails to improve as anticipated.    Doreene NestKatherine K Eladio Dentremont, NP    Review of Systems  Constitutional: Positive for fatigue. Negative for chills and fever.  HENT: Positive for congestion, postnasal drip, rhinorrhea and sinus pressure.   Respiratory: Negative for cough and shortness of breath.   Musculoskeletal: Positive for myalgias.  Allergic/Immunologic: Positive for environmental allergies.       Past Medical History:  Diagnosis Date  . Atrial fibrillation (HCC)   . Childhood asthma   . Hypertension   . Sleep apnea      Social History   Socioeconomic History  . Marital status: Married    Spouse name: Not on file  . Number of children: Not on file  . Years of education: Not on file  . Highest education level: Not on file  Occupational History  . Not on file  Social Needs  . Financial resource strain: Not on  file  . Food insecurity    Worry: Not on file    Inability: Not on file  . Transportation needs    Medical: Not on file    Non-medical: Not on file  Tobacco Use  . Smoking status: Former Smoker    Packs/day: 0.10    Years: 7.00    Pack years: 0.70    Types: Cigarettes    Quit date: 04/2003    Years since quitting: 15.4  . Smokeless tobacco: Never Used  Substance and Sexual Activity  . Alcohol use: No   . Drug use: No    Comment: Marijuana as a teenager.  Marland Kitchen Sexual activity: Yes  Lifestyle  . Physical activity    Days per week: Not on file    Minutes per session: Not on file  . Stress: Not on file  Relationships  . Social Herbalist on phone: Not on file    Gets together: Not on file    Attends religious service: Not on file    Active member of club or organization: Not on file    Attends meetings of clubs or organizations: Not on file    Relationship status: Not on file  . Intimate partner violence    Fear of current or ex partner: Not on file    Emotionally abused: Not on file    Physically abused: Not on file    Forced sexual activity: Not on file  Other Topics Concern  . Not on file  Social History Narrative  . Not on file    Past Surgical History:  Procedure Laterality Date  . CHOLECYSTECTOMY N/A 11/08/2016   Procedure: LAPAROSCOPIC CHOLECYSTECTOMY;  Surgeon: Florene Glen, MD;  Location: [facility] ORS;  Service: General;  Laterality: N/A;  . TONSILLECTOMY      Family History  Problem Relation Age of Onset  . Diabetes Father   . Mental illness Father   . Hypertension Maternal Grandfather     No Known Allergies  Current Outpatient Medications on File Prior to Visit  Medication Sig Dispense Refill  . amLODipine (NORVASC) 10 MG tablet Take 1 tablet (10 mg total) by mouth daily. MUST SCHEDULE ANNUAL EXAM 30 tablet 0  . carvedilol (COREG) 12.5 MG tablet Take 1 tablet (12.5 mg total) by mouth 2 (two) times daily with a meal. 180 tablet 2  . loratadine (CLARITIN) 10 MG tablet Take 10 mg by mouth daily.    . Multiple Vitamin (MULTIVITAMIN WITH MINERALS) TABS tablet Take 1 tablet by mouth 4 (four) times a week. Takes 3-4 times a week     No current facility-administered medications on file prior to visit.     Temp (!) 96.5 F (35.8 C) (Oral)   Ht 6\' 1"  (1.854 m)   Wt 296 lb (134.3 kg)   BMI 39.05 kg/m    Objective:   Physical Exam  Constitutional:  He is oriented to person, place, and time. He appears well-nourished. He does not have a sickly appearance. He does not appear ill.  Respiratory: Effort normal.  Neurological: He is alert and oriented to person, place, and time.  Psychiatric: He has a normal mood and affect.           Assessment & Plan:

## 2018-09-27 NOTE — Patient Instructions (Signed)
Present to the [facility] at [place] before 3:30 pm today for Covid testing.  Nasal Congestion/Ear Pressure/Sinus Pressure: Try using Flonase (fluticasone) nasal spray. Instill 1 spray in each nostril twice daily.   Consider switching from Claritin to Zyrtec for allergies.  I will be in touch with your results once received.  It was a pleasure meeting you! Allie Bossier, NP-C

## 2018-10-02 LAB — NOVEL CORONAVIRUS, NAA: SARS-CoV-2, NAA: NOT DETECTED

## 2018-10-17 ENCOUNTER — Other Ambulatory Visit: Payer: Self-pay | Admitting: Internal Medicine

## 2018-11-11 ENCOUNTER — Telehealth: Payer: Self-pay

## 2018-11-11 NOTE — Telephone Encounter (Signed)
Patient calls in to request a refill on his Amlodipine.  However, appears r/x for #90 , 0 refills was sent in to pharmacy on 10/18/18.  This will carry him through to next appointment in November.  No further fills should be needed at this point.   Appears this may have been communicated to patient by triage RN as stated that "issue was resolved when RN Called back and he declined any further triage".   Nothing further needed at this time.

## 2018-11-11 NOTE — Telephone Encounter (Signed)
[place] Night - Client TELEPHONE ADVICE RECORD [facility] Patient Name: Zachary Cruz Gender: Male DOB: 12/06/1980 Age: 38 Y 16 D Return Phone Number: [phone] (Primary), [phone] (Secondary) Address[place] [address] Client [place] Night - Client Client Site [name] Physician [name] - NP Contact Type Call Who Is Calling Patient / Member / Family / Caregiver Call Type Triage / Clinical Relationship To Patient Self Return Phone Number [phone] (Primary) Chief Complaint Prescription Refill or Medication Request (non symptomatic) Reason for Call Medication Question / Request Initial Comment Caller states is calling to get a prescription refill Amlodipine 10 mg. Translation No Nurse Assessment Guidelines Guideline Title Affirmed Question Affirmed Notes Nurse Date/Time (Eastern Time) Disp. Time Eilene Ghazi Time) Disposition Final User 11/09/2018 10:11:52 AM Attempt made - message left Dew, RN, [name] 11/09/2018 10:26:36 AM Clinical Call Yes Dew, RN, [name] Comments User: [name], RN Date/Time Eilene Ghazi Time): 11/09/2018 10:25:45 AM Caller stated that the issue has been resolved when RN called back and declined triage.

## 2018-12-12 IMAGING — CT CT HEART MORP W/ CTA COR W/ SCORE W/ CA W/CM &/OR W/O CM
4 of 7 series · 8 of 20 positions shown, 9 images · non-contrast
Comparison: None.

CLINICAL DATA: Chest pain

EXAM:
Cardiac CTA
MEDICATIONS:
Sub lingual nitro.  4mg x 2 and lopressor 5mg IV
TECHNIQUE: The patient was scanned on a Siemens [REDACTED]ice scanner. Gantry
rotation speed was 250 msecs. Collimation was 0.6 mm. A 100 kV
prospective scan was triggered in the ascending thoracic aorta at
35-75% of the R-R interval. Average HR during the scan was 60 bpm.
The 3D data set was interpreted on a dedicated work station using
MPR, MIP and VRT modes. A total of 80cc of contrast was used.

[Series 8: best diast 73 % · axial · 0.38mm/px · z∈[+1036,+1090]mm · 2 of 402 slices shown, 3 images]
[im 134/402  vessel]
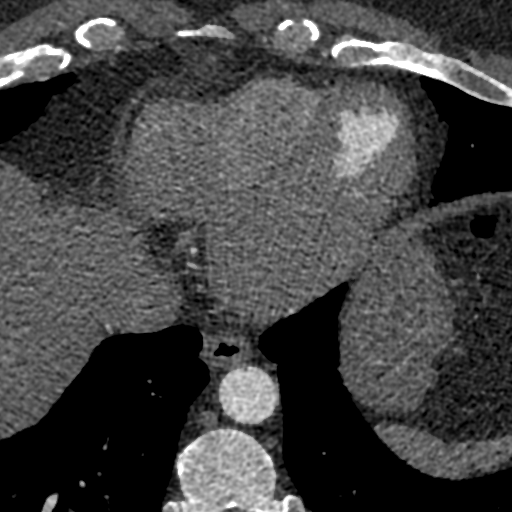
[im 134/402  lung]
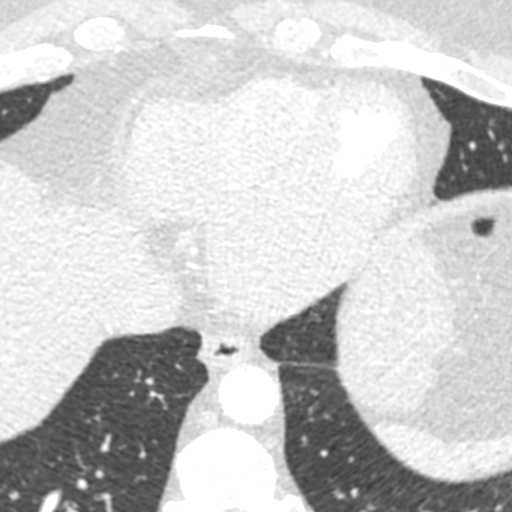
[im 268/402  vessel]
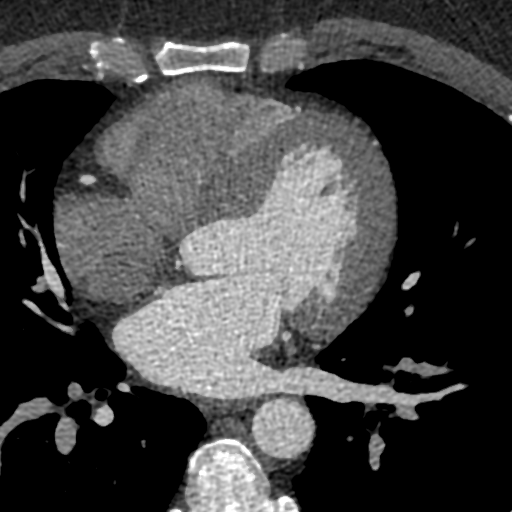

[Series 9: best syst 37 % · axial · 0.38mm/px · z∈[+1036,+1090]mm · 2 of 402 slices shown]
[im 134/402  vessel]
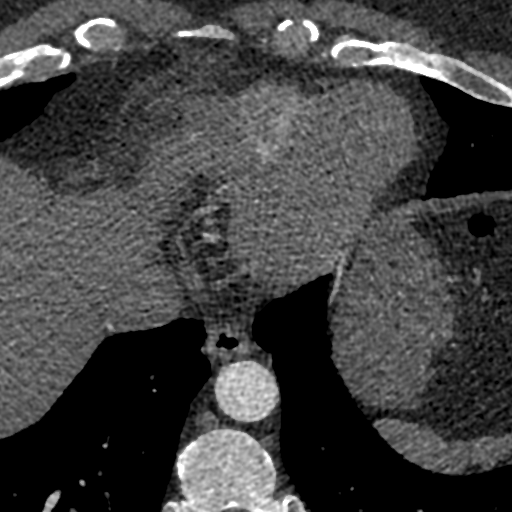
[im 268/402  vessel]
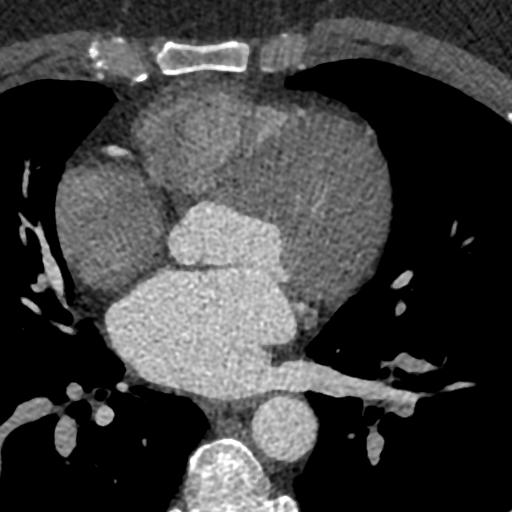

[Series 10: ts diast sharp 73 % · axial · 0.38mm/px · z∈[+1036,+1090]mm · 2 of 402 slices shown]
[im 134/402  lung]
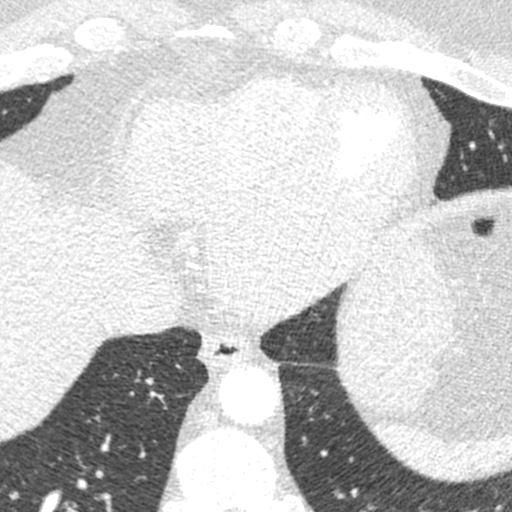
[im 268/402  lung]
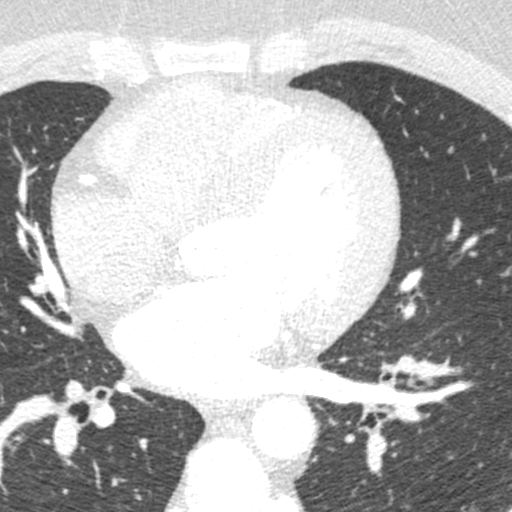

[Series 11: ts syst sharp 37 % · axial · 0.38mm/px · z∈[+1036,+1090]mm · 2 of 402 slices shown]
[im 134/402  lung]
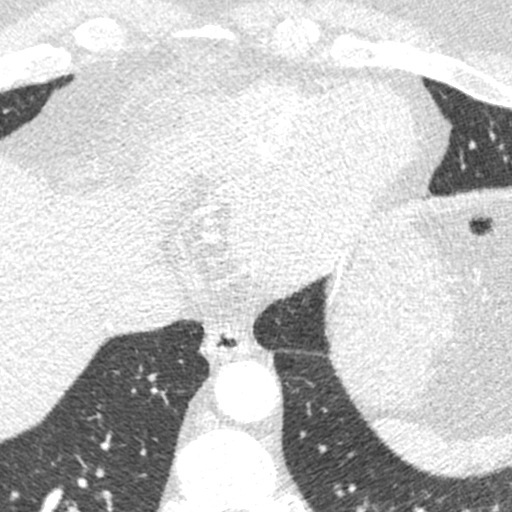
[im 268/402  lung]
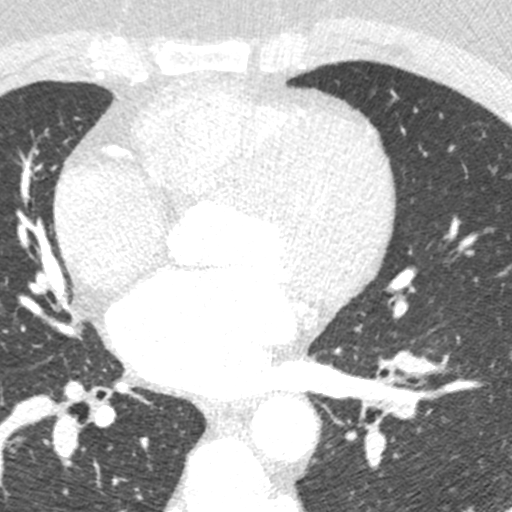

[8 of 20 positions shown; findings below may reference images not displayed]

FINDINGS: Non-cardiac: See separate report from [REDACTED].

Calcium Score: 0 Agatston units.

Coronary Arteries: Right dominant with no anomalies

LM: No plaque or stenosis.

LAD system: Large 1st diagonal. No plaque or stenosis in the LAD
system.

Circumflex system: No plaque or stenosis.

RCA system: No plaque or stenosis.
IMPRESSION: 1. Coronary artery calcium score 0 Agatston units, suggesting low
risk for future cardiac events.

2.  No plaque or stenosis noted in the coronary arteries.

Rajuu Mourya

EXAM:
OVER-READ INTERPRETATION  CT CHEST

The following report is an over-read performed by radiologist Dr.
Hasne Naeem Islam [REDACTED] on 08/28/2017. This over-read
does not include interpretation of cardiac or coronary anatomy or
pathology. The coronary calcium score/coronary CTA interpretation by
the cardiologist is attached.
FINDINGS: Vascular: Main pulmonary arteries are patent. No significant
pericardial fluid. Normal caliber of the visualized thoracic aorta.

Mediastinum/Nodes: Visualized esophagus is unremarkable. No
significant lymph node enlargement in the visualized mediastinum or
hilar regions.

Lungs/Pleura: No large pleural effusions.Punctate pleural-based
calcification in left lower lobe on sequence 14, image 43.
Otherwise, the lungs are clear.

Upper Abdomen: Images of the upper abdomen are unremarkable.

Musculoskeletal: Unremarkable.
IMPRESSION: Negative over-read examination.  No acute abnormality.

## 2018-12-24 ENCOUNTER — Telehealth: Payer: Self-pay | Admitting: Internal Medicine

## 2018-12-26 NOTE — Telephone Encounter (Signed)
Patient stated that he forgot but in order for his insurance to cover his medication he needs it sent to the  [facility]

## 2018-12-27 MED ORDER — CARVEDILOL 12.5 MG PO TABS: ORAL_TABLET | ORAL | 0 refills | Status: DC

## 2018-12-27 NOTE — Addendum Note (Signed)
Addended by: Lurlean Nanny on: 12/27/2018 09:47 AM   Modules accepted: Orders

## 2018-12-27 NOTE — Telephone Encounter (Signed)
Resent to [facility] per pt request

## 2019-01-13 ENCOUNTER — Ambulatory Visit (INDEPENDENT_AMBULATORY_CARE_PROVIDER_SITE_OTHER): Payer: 59 | Admitting: Internal Medicine

## 2019-01-13 ENCOUNTER — Other Ambulatory Visit: Payer: Self-pay

## 2019-01-13 ENCOUNTER — Encounter: Payer: Self-pay | Admitting: Internal Medicine

## 2019-01-13 VITALS — BP 140/98 | HR 70 | Temp 98.2°F | Ht 72.5 in | Wt 286.0 lb

## 2019-01-13 DIAGNOSIS — I480 Paroxysmal atrial fibrillation: Secondary | ICD-10-CM

## 2019-01-13 DIAGNOSIS — G4733 Obstructive sleep apnea (adult) (pediatric): Secondary | ICD-10-CM | POA: Diagnosis not present

## 2019-01-13 DIAGNOSIS — Z0000 Encounter for general adult medical examination without abnormal findings: Secondary | ICD-10-CM | POA: Diagnosis not present

## 2019-01-13 DIAGNOSIS — K649 Unspecified hemorrhoids: Secondary | ICD-10-CM

## 2019-01-13 DIAGNOSIS — I10 Essential (primary) hypertension: Secondary | ICD-10-CM

## 2019-01-13 DIAGNOSIS — F320 Major depressive disorder, single episode, mild: Secondary | ICD-10-CM | POA: Diagnosis not present

## 2019-01-13 LAB — COMPREHENSIVE METABOLIC PANEL
ALT: 29 U/L (ref 0–53)
AST: 23 U/L (ref 0–37)
Albumin: 4.3 g/dL (ref 3.5–5.2)
Alkaline Phosphatase: 83 U/L (ref 39–117)
BUN: 11 mg/dL (ref 6–23)
CO2: 29 mEq/L (ref 19–32)
Calcium: 9 mg/dL (ref 8.4–10.5)
Chloride: 103 mEq/L (ref 96–112)
Creatinine, Ser: 1.04 mg/dL (ref 0.40–1.50)
GFR: 96.6 mL/min (ref >60.00)
Glucose, Bld: 98 mg/dL (ref 70–99)
Potassium: 3.6 mEq/L (ref 3.5–5.1)
Sodium: 140 mEq/L (ref 135–145)
Total Bilirubin: 0.5 mg/dL (ref 0.2–1.2)
Total Protein: 7.1 g/dL (ref 6.0–8.3)

## 2019-01-13 LAB — LIPID PANEL
Cholesterol: 157 mg/dL (ref 0–200)
HDL: 45.3 mg/dL (ref >39.00)
LDL Cholesterol: 101 mg/dL — ABNORMAL HIGH (ref 0–99)
NonHDL: 111.59
Total CHOL/HDL Ratio: 3
Triglycerides: 53 mg/dL (ref 0.0–149.0)
VLDL: 10.6 mg/dL (ref 0.0–40.0)

## 2019-01-13 LAB — HEMOGLOBIN A1C: Hgb A1c MFr Bld: 5.4 % (ref 4.6–6.5)

## 2019-01-13 LAB — CBC
HCT: 42 % (ref 39.0–52.0)
Hemoglobin: 14 g/dL (ref 13.0–17.0)
MCHC: 33.2 g/dL (ref 30.0–36.0)
MCV: 82.7 fl (ref 78.0–100.0)
Platelets: 301 10*3/uL (ref 150.0–400.0)
RBC: 5.08 Mil/uL (ref 4.22–5.81)
RDW: 12.8 % (ref 11.5–15.5)
WBC: 5.9 10*3/uL (ref 4.0–10.5)

## 2019-01-13 NOTE — Assessment & Plan Note (Signed)
>>  ASSESSMENT AND PLAN FOR PAROXYSMAL ATRIAL FIBRILLATION (HCC) WRITTEN ON 01/13/2019 10:17 AM BY Lorre Munroe, NP  Continue Carvedilol Continue to follow with cardiology

## 2019-01-13 NOTE — Assessment & Plan Note (Addendum)
Uncontrolled I wanted to start Losartan 25 mg daily for better BP control but he declines. He understands the risks including: blood clots, stroke and death. Continue Amlodipine and Carvedilol as prescribed.  Reinforced DASH diet and exercise for weight loss

## 2019-01-13 NOTE — Patient Instructions (Signed)

## 2019-01-13 NOTE — Progress Notes (Signed)
Subjective:    Patient ID: Zachary Cruz, male    DOB: 1980-05-08, 38 y.o.   MRN: [id number]  HPI  Pt presents to the clinic today for his annual exam. He is also due to follow up chronic conditions.  Afib: Paroxysmal. He is taking Carvedilol as prescribed. He is not on any anticoagulation at this time. He follows with cardiology yearly (Dr Darrelyn [place]).   HTN: His BP today is high today at 140/98. He is taking Amlodipine and Carvedilol as prescribed. Advised patient that his blood pressure is high and may need to add additional HTN medication. Patient denied adding a new medication and understands the risks including: blood clots, stroke and death. ECG from 2018-05-04 reviewed.  OSA: He averages 7 hours of sleep with the use of his CPAP. Sleep study from 03/2017 reviewed.  Depression: Feels under a lot stress. States he has been having marital issues since last Thursday. Has an appointment with therapist today at 11am. Emotionally drained. He has never been treated for depression in the past.   Flu: 12/2018 Tetanus: 05/2017 Vision: asneeded Dentist: biannually  Diet: Eating meats. He also eats plenty of fruits and veggies daily. Eats fried foods every now and then. He drinks mainly water.  Exercise: Exercise at least 2 hours a week.   Patient states he has been having hemorroids intermittent for about 5-6 months. He takes high-fiber smoothies in the morning which help. He has bright red blood due to the hemorroids and had one occasion that caused him pain. He can control them with OTC preparation H.   Review of Systems      Past Medical History:  Diagnosis Date  . Atrial fibrillation (HCC)   . Childhood asthma   . Hypertension   . Sleep apnea     Current Outpatient Medications  Medication Sig Dispense Refill  . amLODipine (NORVASC) 10 MG tablet Take 1 tablet by mouth once daily 90 tablet 0  . carvedilol (COREG) 12.5 MG tablet TAKE 1 TABLET BY MOUTH TWICE DAILY WITH  A   MEAL 180 tablet 0  . loratadine (CLARITIN) 10 MG tablet Take 10 mg by mouth daily.    . Multiple Vitamin (MULTIVITAMIN WITH MINERALS) TABS tablet Take 1 tablet by mouth 4 (four) times a week. Takes 3-4 times a week     No current facility-administered medications for this visit.     No Known Allergies  Family History  Problem Relation Age of Onset  . Diabetes Father   . Mental illness Father   . Hypertension Maternal Grandfather     Social History   Socioeconomic History  . Marital status: Married    Spouse name: Not on file  . Number of children: Not on file  . Years of education: Not on file  . Highest education level: Not on file  Occupational History  . Not on file  Social Needs  . Financial resource strain: Not on file  . Food insecurity    Worry: Not on file    Inability: Not on file  . Transportation needs    Medical: Not on file    Non-medical: Not on file  Tobacco Use  . Smoking status: Former Smoker    Packs/day: 0.10    Years: 7.00    Pack years: 0.70    Types: Cigarettes    Quit date: 2003/05/05    Years since quitting: 15.7  . Smokeless tobacco: Never Used  Substance and Sexual Activity  . Alcohol  use: No  . Drug use: No    Comment: Marijuana as a teenager.  Marland Kitchen Sexual activity: Yes  Lifestyle  . Physical activity    Days per week: Not on file    Minutes per session: Not on file  . Stress: Not on file  Relationships  . Social Herbalist on phone: Not on file    Gets together: Not on file    Attends religious service: Not on file    Active member of club or organization: Not on file    Attends meetings of clubs or organizations: Not on file    Relationship status: Not on file  . Intimate partner violence    Fear of current or ex partner: Not on file    Emotionally abused: Not on file    Physically abused: Not on file    Forced sexual activity: Not on file  Other Topics Concern  . Not on file  Social History Narrative  . Not on file      Constitutional: Denies fever, malaise, fatigue, headache or abrupt weight changes.  HEENT: Denies eye pain, eye redness, ear pain, ringing in the ears, wax buildup, runny nose, nasal congestion, bloody nose, or sore throat. Respiratory: Denies difficulty breathing, shortness of breath, cough or sputum production.   Cardiovascular: Denies chest pain, chest tightness, palpitations or swelling in the hands or feet.  Gastrointestinal: Has intermittent hemorroidal bleeding. Denies abdominal pain, bloating, constipation, diarrhea.  GU: Denies urgency, frequency, pain with urination, burning sensation, blood in urine, odor or discharge. Musculoskeletal: Denies decrease in range of motion, difficulty with gait, muscle pain or joint pain and swelling.  Skin: Denies redness, rashes, lesions or ulcercations.  Neurological: Denies dizziness, difficulty with memory, difficulty with speech or problems with balance and coordination.  Psych: Endorses feeling down and emotionally stressed. Denies anxiety or SI/HI.  No other specific complaints in a complete review of systems (except as listed in HPI above).  Objective:   Physical Exam  Ht 6' 0.5" (1.842 m)   Wt 286 lb (129.7 kg)   BMI 38.26 kg/m  Wt Readings from Last 3 Encounters:  01/13/19 286 lb (129.7 kg)  09/27/18 296 lb (134.3 kg)  04/26/18 296 lb (134.3 kg)    General: Appears his stated age, obese, in NAD. Skin: Warm, dry and intact. No rashes, lesions or ulcerations noted. HEENT: Head: normal shape and size; Eyes: sclera white, no icterus, conjunctiva pink, PERRLA and EOMs intact; Ears: Tm's gray and intact, normal light reflex;  Neck:  Neck supple, trachea midline. No masses, lumps or thyromegaly present.  Cardiovascular: Normal rate and rhythm. S1,S2 noted.  No murmur, rubs or gallops noted. No JVD or BLE edema.  Pulmonary/Chest: Normal effort and positive vesicular breath sounds. No respiratory distress. No wheezes, rales or ronchi  noted.  Abdomen: Soft and nontender. Normal bowel sounds. No distention or masses noted. Liver, spleen and kidneys non palpable. Musculoskeletal: Strength 5/5 BUE/BLE. No difficulty with gait.  Neurological: Alert and oriented. Cranial nerves II-XII grossly intact. Coordination normal.  Psychiatric: Mood and affect mildly flat. Behavior is normal. Judgment and thought content normal.     BMET    Component Value Date/Time   NA 140 01/10/2018 1248   K 3.6 01/10/2018 1248   CL 104 01/10/2018 1248   CO2 31 01/10/2018 1248   GLUCOSE 94 01/10/2018 1248   BUN 11 01/10/2018 1248   CREATININE 1.00 01/10/2018 1248   CALCIUM 8.8 01/10/2018 1248  GFRNONAA >60 11/02/2016 1043   GFRAA >60 11/02/2016 1043    Lipid Panel     Component Value Date/Time   CHOL 152 01/10/2018 1248   CHOL 163 05/17/2017 0819   TRIG 55.0 01/10/2018 1248   HDL 50.80 01/10/2018 1248   HDL 59 05/17/2017 0819   CHOLHDL 3 01/10/2018 1248   VLDL 11.0 01/10/2018 1248   LDLCALC 90 01/10/2018 1248   LDLCALC 92 05/17/2017 0819    CBC    Component Value Date/Time   WBC 5.6 01/10/2018 1248   RBC 5.13 01/10/2018 1248   HGB 14.2 01/10/2018 1248   HCT 41.8 01/10/2018 1248   PLT 313.0 01/10/2018 1248   MCV 81.5 01/10/2018 1248   MCH 27.8 11/02/2016 1043   MCHC 33.8 01/10/2018 1248   RDW 12.9 01/10/2018 1248   LYMPHSABS 2.2 11/02/2016 1043   MONOABS 0.5 11/02/2016 1043   EOSABS 0.5 11/02/2016 1043   BASOSABS 0.0 11/02/2016 1043    Hgb A1C Lab Results  Component Value Date   HGBA1C 5.5 01/10/2018            Assessment & Plan:  Preventative Health Maintenance:  Flu and tetanus UTD Encouraged him to consume a balanced diet and exercise regimen Advised him to see a dentist annually Will check CBC, CMET, Lipid and A1C today  RTC in 1 year, sooner if needed Nicki Reaperegina Baity, NP

## 2019-01-13 NOTE — Assessment & Plan Note (Addendum)
Continue Carvedilol Continue to follow with cardiology

## 2019-01-13 NOTE — Assessment & Plan Note (Signed)
PHQ 9 score 5 Support offered today He declines medication therapy at this time He will meet with his therapist as previously scheduled

## 2019-01-13 NOTE — Assessment & Plan Note (Addendum)
Discussed how weight loss could help reduce OSA symptoms Continue CPAP for now

## 2019-01-13 NOTE — Assessment & Plan Note (Signed)
>>  ASSESSMENT AND PLAN FOR HTN (HYPERTENSION) WRITTEN ON 01/13/2019 10:18 AM BY Nicki Reaper W, NP  Uncontrolled I wanted to start Losartan 25 mg daily for better BP control but he declines. He understands the risks including: blood clots, stroke and death. Continue Amlodipine and Carvedilol as prescribed.  Reinforced DASH diet and exercise for weight loss

## 2019-01-13 NOTE — Assessment & Plan Note (Signed)
Encouraged high fiber diet, adequate water intake Can use stool softeners if needed Continue Preparation H OTC prn

## 2019-01-13 NOTE — Progress Notes (Deleted)
Established Patient Office Visit  Subjective:  Patient ID: Zachary Cruz, male    DOB: 1980-12-28  Age: 38 y.o. MRN: [id number]    HPI Zachary Cruz presents to the clinic today for his annual exam. He is also due for follow-up of chronic conditions.   Afib: Controlled on Carvedilol and ASA. He follows with Dr. Saunders Revel.  HTN: His BP today is . She is taking Amlodipine and Carvedilol as prescribed. ECG from 04/2017 reviewed.  OSA: He averages about 4-5 hours of sleep with his CPAP. He feels rested when he wakes up. Sleep study from 03/2017 reviewed. He follows with Dr. Ashby Dawes.   Flu: 01/02/2018 Tetanus: 2019 Dentist: biannually  Diet: He does eat meat. He consumes some fruits and veggies. He does eat fried foods. He drinks mostly water. Exercise: Cardio/weights 2 x week  Past Medical History:  Diagnosis Date  . Atrial fibrillation ([place])   . Childhood asthma   . Hypertension   . Sleep apnea     Past Surgical History:  Procedure Laterality Date  . CHOLECYSTECTOMY N/A 11/08/2016   Procedure: LAPAROSCOPIC CHOLECYSTECTOMY;  Surgeon: Florene Glen, MD;  Location: [facility] ORS;  Service: General;  Laterality: N/A;  . TONSILLECTOMY      Family History  Problem Relation Age of Onset  . Diabetes Father   . Mental illness Father   . Hypertension Maternal Grandfather     Social History   Socioeconomic History  . Marital status: Married    Spouse name: Not on file  . Number of children: Not on file  . Years of education: Not on file  . Highest education level: Not on file  Occupational History  . Not on file  Social Needs  . Financial resource strain: Not on file  . Food insecurity    Worry: Not on file    Inability: Not on file  . Transportation needs    Medical: Not on file    Non-medical: Not on file  Tobacco Use  . Smoking status: Former Smoker    Packs/day: 0.10    Years: 7.00    Pack years: 0.70    Types: Cigarettes    Quit date: 04/2003     Years since quitting: 15.7  . Smokeless tobacco: Never Used  Substance and Sexual Activity  . Alcohol use: No  . Drug use: No    Comment: Marijuana as a teenager.  Marland Kitchen Sexual activity: Yes  Lifestyle  . Physical activity    Days per week: Not on file    Minutes per session: Not on file  . Stress: Not on file  Relationships  . Social Herbalist on phone: Not on file    Gets together: Not on file    Attends religious service: Not on file    Active member of club or organization: Not on file    Attends meetings of clubs or organizations: Not on file    Relationship status: Not on file  . Intimate partner violence    Fear of current or ex partner: Not on file    Emotionally abused: Not on file    Physically abused: Not on file    Forced sexual activity: Not on file  Other Topics Concern  . Not on file  Social History Narrative  . Not on file    Outpatient Medications Prior to Visit  Medication Sig Dispense Refill  . amLODipine (NORVASC) 10 MG tablet Take 1 tablet by mouth once  daily 90 tablet 0  . carvedilol (COREG) 12.5 MG tablet TAKE 1 TABLET BY MOUTH TWICE DAILY WITH  A  MEAL 180 tablet 0  . loratadine (CLARITIN) 10 MG tablet Take 10 mg by mouth daily.    . Multiple Vitamin (MULTIVITAMIN WITH MINERALS) TABS tablet Take 1 tablet by mouth 4 (four) times a week. Takes 3-4 times a week     No facility-administered medications prior to visit.     No Known Allergies  ROS Review of Systems   Constitutional: Denies fever, malaise, fatigue, headache or abrupt weight changes.  HEENT: Denies eye pain, eye redness, ear pain, ringing in the ears, wax buildup, runny nose, nasal congestion, bloody nose, or sore throat. Respiratory: Denies difficulty breathing, shortness of breath, cough or sputum production.   Cardiovascular: Denies chest pain, chest tightness, palpitations or swelling in the hands or feet.  Gastrointestinal: Denies abdominal pain, bloating, constipation,  diarrhea or blood in the stool.  GU: Denies urgency, frequency, pain with urination, burning sensation, blood in urine, odor or discharge. Musculoskeletal: Denies decrease in range of motion, difficulty with gait, muscle pain or joint pain and swelling.  Skin: Denies redness, rashes, lesions or ulcercations.  Neurological: Denies dizziness, difficulty with memory, difficulty with speech or problems with balance and coordination.  Psych: Denies anxiety, depression, SI/HI.  No other specific complaints in a complete review of systems (except as listed in HPI above).    Objective:    Physical Exam   General: Appears his stated age, obese, in NAD. Skin: Warm, dry and intact.  HEENT: Head: normal shape and size; Eyes: sclera white, no icterus, conjunctiva pink, PERRLA and EOMs intact; Ears: Tm's gray and intact, normal light reflex; Throat/Mouth: Teeth present, mucosa pink and moist, no exudate, lesions or ulcerations noted.  Neck:  Neck supple, trachea midline. No masses, lumps or thyromegaly present.  Cardiovascular: Normal rate and rhythm. S1,S2 noted.  No murmur, rubs or gallops noted. No JVD or BLE edema. Pulmonary/Chest: Normal effort and positive vesicular breath sounds. No respiratory distress. No wheezes, rales or ronchi noted.  Abdomen: Soft and nontender. Normal bowel sounds. No distention or masses noted. Liver, spleen and kidneys non palpable. Musculoskeletal: Strength 5/5 BUE/BLE.Marland Kitchen No difficulty with gait.  Neurological: Alert and oriented. Cranial nerves II-XII grossly intact. Coordination normal.  Psychiatric: Mood and affect normal. Behavior is normal. Judgment and thought content normal.   There were no vitals taken for this visit. Wt Readings from Last 3 Encounters:  09/27/18 134.3 kg  04/26/18 134.3 kg  01/10/18 128.4 kg     Health Maintenance Due  Topic Date Due  . HIV Screening  10/24/1995  . INFLUENZA VACCINE  10/12/2018    There are no preventive care  reminders to display for this patient.  No results found for: TSH Lab Results  Component Value Date   WBC 5.6 01/10/2018   HGB 14.2 01/10/2018   HCT 41.8 01/10/2018   MCV 81.5 01/10/2018   PLT 313.0 01/10/2018   Lab Results  Component Value Date   NA 140 01/10/2018   K 3.6 01/10/2018   CO2 31 01/10/2018   GLUCOSE 94 01/10/2018   BUN 11 01/10/2018   CREATININE 1.00 01/10/2018   BILITOT 0.5 01/10/2018   ALKPHOS 75 01/10/2018   AST 18 01/10/2018   ALT 24 01/10/2018   PROT 7.4 01/10/2018   ALBUMIN 4.2 01/10/2018   CALCIUM 8.8 01/10/2018   ANIONGAP 8 11/02/2016   GFR 108.00 01/10/2018   Lab Results  Component Value Date   CHOL 152 01/10/2018   Lab Results  Component Value Date   HDL 50.80 01/10/2018   Lab Results  Component Value Date   LDLCALC 90 01/10/2018   Lab Results  Component Value Date   TRIG 55.0 01/10/2018   Lab Results  Component Value Date   CHOLHDL 3 01/10/2018   Lab Results  Component Value Date   HGBA1C 5.5 01/10/2018      Assessment & Plan:   Preventative Health Maintenance:  Flu and tetanus UTD Encouraged him to consume a balanced diet and exercise regimen Advised him to see an eye doctor and dentist annually Will check CBC, CMET, Lipid and A1C today  RTC in 1 year, sooner if needed Problem List Items Addressed This Visit    None      No orders of the defined types were placed in this encounter.   Follow-up: No follow-ups on file.    Daivd CouncilSilvestre  Tranell Wojtkiewicz, Student-PA

## 2019-01-14 ENCOUNTER — Encounter: Payer: 59 | Admitting: Internal Medicine

## 2019-01-30 ENCOUNTER — Telehealth: Payer: Self-pay

## 2019-01-30 ENCOUNTER — Telehealth: Payer: Self-pay | Admitting: Internal Medicine

## 2019-01-30 MED ORDER — LOSARTAN POTASSIUM 25 MG PO TABS: 25.0000 mg | ORAL_TABLET | Freq: Every day | ORAL | 0 refills | Status: DC

## 2019-01-30 NOTE — Telephone Encounter (Signed)
Spoke with the patient.  Patient was started on Losartan 25mg  qd today by his pcp.  Advised the patient that Losartan is prescribed by Cardiology frequently and is well tolerated.  Patient asked about Losartan's effectiveness with treating hypertension in African Americans.  He is also on a BB and CCB. Advised the patient that with his other antihypertensive treatment he could achieve good therapeutic benefit.  He would also like Dr. Darnelle Bos input. Advised the patient that I will fwd the message to Dr. Saunders Revel

## 2019-01-30 NOTE — Telephone Encounter (Signed)
Patient calling  States PCP R Garnette Gunner has prescribed losartan 25 MG  Patient would like to know more about the medication and if Dr End feels it will be ok for patient Please call to discuss

## 2019-01-30 NOTE — Addendum Note (Signed)
Addended by: Jearld Fenton on: 01/30/2019 02:17 PM   Modules accepted: Orders

## 2019-01-30 NOTE — Telephone Encounter (Signed)
30 day supply of Losartan 25 mg sent to pharmacy. He should follow up with me in 2 weeks for BP check, sooner if lightheadedness persists or worsens. [name], NP

## 2019-01-30 NOTE — Telephone Encounter (Signed)
Pt said had CPX on 01/13/19; new BP med was offered and declined by pt at the 01/13/19 visit and pt has been journaling his BP on 01/28/19 8 AM BP 147/97 P 60's 01/29/19 8 AM BP 133/90 P 60s and 10 PM BP 146/91 no P and  01/30/19 at 8 AM BP 150/100 P 75.   pt has slight H/A but pt thinks is sinus related; and pt is   lightheaded. No CP or SOB. On 01/13/19 R Baity NP was going to start Losartan 25 mg daily. Pt request med sent to [facility] [address]. Pt request cb to know what med is being sent to pharmacy and any further instructions.

## 2019-01-31 NOTE — Telephone Encounter (Signed)
I think losartan is a reasonable choice, given suboptimal blood pressure control while on amlodipine and carvedilol.  While losartan and similar medications can sometimes be less effective in African Americans compared with some other patient populations, I have found that it often works well when added to other agents like amlodipine.  If he is concerned about starting this medication and wishes to discuss other treatment options, I suggest that he schedule a visit (in person or virtual) with me or an APP to discuss this further.  Nelva Bush, MD [facility] Pager: [phone]

## 2019-01-31 NOTE — Telephone Encounter (Signed)
DPR on file with ok to leave a detailed message. Left a detailed message on the patients voicemail with Dr. Darnelle Bos response and recommendation. Patient is to contact the office if questions or concerns.

## 2019-02-21 ENCOUNTER — Telehealth: Payer: Self-pay | Admitting: Internal Medicine

## 2019-02-21 ENCOUNTER — Other Ambulatory Visit: Payer: Self-pay | Admitting: Internal Medicine

## 2019-02-21 MED ORDER — AMLODIPINE BESYLATE 10 MG PO TABS: 10.0000 mg | ORAL_TABLET | Freq: Every day | ORAL | 0 refills | Status: DC

## 2019-02-21 NOTE — Telephone Encounter (Signed)
Rx sent through e-scribe  pt is overdue for 2 week office visit f/u for BP recheck, must schedule

## 2019-02-21 NOTE — Telephone Encounter (Signed)
Spoke with patient and he states these readings are right after taking his medication. Reviewed information on blood pressure monitoring and sent the reference documents for that to review. Instructed him to take blood pressures 2 hours after medication and if it persistently remains 140/90 or higher to please give [place] a call back so that we can monitor if additional medication is needed. He verbalized understanding of our conversation, agreement with plan, and had no further questions at this time.

## 2019-02-21 NOTE — Patient Instructions (Signed)
How to Take Your Blood Pressure You can take your blood pressure at home with a machine. You may need to check your blood pressure at home:  To check if you have high blood pressure (hypertension).  To check your blood pressure over time.  To make sure your blood pressure medicine is working. Supplies needed: You will need a blood pressure machine, or monitor. You can buy one at a drugstore or online. When choosing one:  Choose one with an arm cuff.  Choose one that wraps around your upper arm. Only one finger should fit between your arm and the cuff.  Do not choose one that measures your blood pressure from your wrist or finger. Your doctor can suggest a monitor. How to prepare Avoid these things for 30 minutes before checking your blood pressure:  Drinking caffeine.  Drinking alcohol.  Eating.  Smoking.  Exercising. Five minutes before checking your blood pressure:  Pee.  Sit in a dining chair. Avoid sitting in a soft couch or armchair.  Be quiet. Do not talk. How to take your blood pressure Follow the instructions that came with your machine. If you have a digital blood pressure monitor, these may be the instructions: 1. Sit up straight. 2. Place your feet on the floor. Do not cross your ankles or legs. 3. Rest your left arm at the level of your heart. You may rest it on a table, desk, or chair. 4. Pull up your shirt sleeve. 5. Wrap the blood pressure cuff around the upper part of your left arm. The cuff should be 1 inch (2.5 cm) above your elbow. It is best to wrap the cuff around bare skin. 6. Fit the cuff snugly around your arm. You should be able to place only one finger between the cuff and your arm. 7. Put the cord inside the groove of your elbow. 8. Press the power button. 9. Sit quietly while the cuff fills with air and loses air. 10. Write down the numbers on the screen. 11. Wait 2-3 minutes and then repeat steps 1-10. What do the numbers mean? Two  numbers make up your blood pressure. The first number is called systolic pressure. The second is called diastolic pressure. An example of a blood pressure reading is "120 over 80" (or 120/80). If you are an adult and do not have a medical condition, use this guide to find out if your blood pressure is normal: Normal  First number: below 120.  Second number: below 80. Elevated  First number: 120-129.  Second number: below 80. Hypertension stage 1  First number: 130-139.  Second number: 80-89. Hypertension stage 2  First number: 140 or above.  Second number: 90 or above. Your blood pressure is above normal even if only the top or bottom number is above normal. Follow these instructions at home:  Check your blood pressure as often as your doctor tells you to.  Take your monitor to your next doctor's appointment. Your doctor will: ? Make sure you are using it correctly. ? Make sure it is working right.  Make sure you understand what your blood pressure numbers should be.  Tell your doctor if your medicines are causing side effects. Contact a doctor if:  Your blood pressure keeps being high. Get help right away if:  Your first blood pressure number is higher than 180.  Your second blood pressure number is higher than 120. This information is not intended to replace advice given to you by your health   care provider. Make sure you discuss any questions you have with your health care provider. Document Released: 02/10/2008 Document Revised: 02/09/2017 Document Reviewed: 08/06/2015 Elsevier Patient Education  2020 Elsevier Inc.  

## 2019-02-21 NOTE — Telephone Encounter (Signed)
Patient called and would like his medication sent to [facility] by [employer]. Patient's out of his medication.

## 2019-02-21 NOTE — Telephone Encounter (Signed)
798/92-11 trending up   130/80  120-130/80 first taking   Pt c/o BP issue: STAT if pt c/o blurred vision, one-sided weakness or slurred speech  1. What are your last 5 BP readings?   When first taking losartan 120-130/80 now trending up to 130-140/ 80-90   2. Are you having any other symptoms (ex. Dizziness, headache, blurred vision, passed out)? No   3. What is your BP issue? Patient concerned losartan is not working as well as thought.  Patient states he has been exercising and eating only at home.

## 2019-02-25 ENCOUNTER — Other Ambulatory Visit: Payer: Self-pay | Admitting: Internal Medicine

## 2019-03-25 ENCOUNTER — Other Ambulatory Visit: Payer: Self-pay | Admitting: Internal Medicine

## 2019-03-26 NOTE — Telephone Encounter (Signed)
With his BP being uncontrolled I would prefer for cardiology to fill. He refused to add additional BP meds last time. If they won't do it, then I will.

## 2019-03-26 NOTE — Telephone Encounter (Signed)
Are you going to continue to fill BP meds or should Cardiology? Please advise

## 2019-04-02 ENCOUNTER — Other Ambulatory Visit: Payer: Self-pay | Admitting: Internal Medicine

## 2019-05-01 ENCOUNTER — Encounter: Payer: Self-pay | Admitting: Family

## 2019-05-01 ENCOUNTER — Telehealth (INDEPENDENT_AMBULATORY_CARE_PROVIDER_SITE_OTHER): Payer: 59 | Admitting: Family

## 2019-05-01 ENCOUNTER — Other Ambulatory Visit: Payer: Self-pay

## 2019-05-01 VITALS — BP 126/82 | Ht 73.0 in | Wt 285.0 lb

## 2019-05-01 DIAGNOSIS — G4733 Obstructive sleep apnea (adult) (pediatric): Secondary | ICD-10-CM | POA: Diagnosis not present

## 2019-05-01 DIAGNOSIS — I10 Essential (primary) hypertension: Secondary | ICD-10-CM | POA: Diagnosis not present

## 2019-05-01 DIAGNOSIS — I480 Paroxysmal atrial fibrillation: Secondary | ICD-10-CM | POA: Diagnosis not present

## 2019-05-01 MED ORDER — LOSARTAN POTASSIUM 25 MG PO TABS: 25.0000 mg | ORAL_TABLET | Freq: Every day | ORAL | 3 refills | Status: DC

## 2019-05-01 NOTE — Patient Instructions (Signed)
Medication Instructions:  Continue your current medications.  *If you need a refill on your cardiac medications before your next appointment, please call your pharmacy*  Lab Work: No lab work ordered today. Your lab work from primary care in November looked great!  Testing/Procedures: None ordered today.   Follow-Up: At [facility], you and your health needs are our priority.  As part of our continuing mission to provide you with exceptional heart care, we have created designated Provider Care Teams.  These Care Teams include your primary Cardiologist (physician) and Advanced Practice Providers (APPs -  Physician Assistants and Nurse Practitioners) who all work together to provide you with the care you need, when you need it.  Your next appointment:   1 year(s)  The format for your next appointment:   In Person  Provider:    You may see Yvonne Kendall, MD or one of the following Advanced Practice Providers on your designated Care Team:    Nicolasa Ducking, NP  Eula Listen, PA-C  Marisue Ivan, PA-C   Other Instructions  It was a pleasure to meet you today. Thank you for your flexibility to do a virtual visit - we greatly appreciate it!  Your blood pressure is fantastic. We'll continue the same medications. A refill of Losartan was sent to [facility].   Your joint discomfort after sugar and carbohydrates is likely due to inflammation. You can use Ibuprofen (Advil) or Acetaminophen (Tylenol) as needed. Heat is also helpful to reduce inflammation.   Exercise recommendations: The American Heart Association recommends 150 minutes of moderate intensity exercise weekly. Try 30 minutes of moderate intensity exercise 4-5 times per week. This could include walking, jogging, or swimming. Would recommend gradually increasing to this frequency - something small is better than nothing at all!

## 2019-05-01 NOTE — Progress Notes (Signed)
Virtual Visit via Video Note   This visit type was conducted due to national recommendations for restrictions regarding the COVID-19 Pandemic (e.g. social distancing) in an effort to limit this patient's exposure and mitigate transmission in our community.  Due to his co-morbid illnesses, this patient is at least at moderate risk for complications without adequate follow up.  This format is felt to be most appropriate for this patient at this time.  All issues noted in this document were discussed and addressed.  A limited physical exam was performed with this format.  Please refer to the patient's chart for his consent to telehealth for [facility].   Date:  05/01/2019   ID:  Zachary Cruz, DOB 12-09-1980, MRN [id number]  Patient Location: Home Provider Location: Home  PCP:  Lorre Munroe, NP  Cardiologist:  Yvonne Kendall, MD  Electrophysiologist:  None   Evaluation Performed:  Follow-Up Visit  Chief Complaint:  BP follow up  History of Present Illness:    Zachary Cruz is a 39 y.o. male with PAF, HTN, OSA on CPAP, depression.  [place] He had an ETT 05/2017 which was intermediate risk with 61mm horizontal ST segment depressions in inferolateral leads. Subsequent cardiac CTA 08/2017 with clean coronary arteries with no plaque nor calcification.   Started on Losartan 25mg  by PCP in November.   He reports feeling overall well.  Denies chest pain, pressure, tightness.  Reports no shortness of breath or dyspnea on exertion.  Reports no palpitations, lightheadedness, dizziness.  Blood pressure when checked this morning at home was within goal.  He checks intermittently at home.  Reports tolerating the addition of losartan well.  Endorses trying to lose weight via diet and exercise.  Tells me he notices that after he eats sugar carbohydrates he has more joint pain.  We discussed that this was likely inflammatory and recommended use of Tylenol,  ibuprofen, or heat.  The patient does not have symptoms concerning for COVID-19 infection (fever, chills, cough, or new shortness of breath).    Past Medical History:  Diagnosis Date  . Atrial fibrillation (HCC)   . Childhood asthma   . Hypertension   . Sleep apnea    Past Surgical History:  Procedure Laterality Date  . CHOLECYSTECTOMY N/A 11/08/2016   Procedure: LAPAROSCOPIC CHOLECYSTECTOMY;  Surgeon: 11/10/2016, MD;  Location: [facility] ORS;  Service: General;  Laterality: N/A;  . TONSILLECTOMY       Current Meds  Medication Sig  . amLODipine (NORVASC) 10 MG tablet TAKE 1 TABLET(10 MG) BY MOUTH DAILY. FOLLOW UP  . carvedilol (COREG) 12.5 MG tablet TAKE 1 TABLET BY MOUTH TWICE DAILY WITH A MEAL  . cetirizine (ZYRTEC) 10 MG tablet Take 1 tablet (10 mg) by mouth once daily  . losartan (COZAAR) 25 MG tablet TAKE 1 TABLET(25 MG) BY MOUTH DAILY. FOLLOW UP  . Multiple Vitamins-Minerals (DAILY MULTIPLE VITAMINS/MIN PO) Take 1 tablet by mouth once daily  . [DISCONTINUED] Multiple Vitamin (MULTIVITAMIN WITH MINERALS) TABS tablet Take 1 tablet by mouth 4 (four) times a week. Takes 3-4 times a week     Allergies:   Patient has no known allergies.   Social History   Tobacco Use  . Smoking status: Former Smoker    Packs/day: 0.10    Years: 7.00    Pack years: 0.70    Types: Cigarettes    Quit date: 04/2003    Years since quitting: 16.0  . Smokeless tobacco: Never  Used  Substance Use Topics  . Alcohol use: No  . Drug use: No    Comment: Marijuana as a teenager.     Family Hx: The patient's family history includes Diabetes in his father; Hypertension in his maternal grandfather; Mental illness in his father.  ROS:   Please see the history of present illness.    Review of Systems  Constitution: Negative for chills, fever and malaise/fatigue.  Cardiovascular: Negative for chest pain, dyspnea on exertion, leg swelling, near-syncope, orthopnea, palpitations and syncope.    Respiratory: Negative for cough, shortness of breath and wheezing.   Musculoskeletal: Positive for joint pain.  Gastrointestinal: Negative for nausea and vomiting.  Neurological: Negative for dizziness, light-headedness and weakness.   All other systems reviewed and are negative.  Prior CV studies:   The following studies were reviewed today:  Cardiac CTA (08/28/2017): No plaque or stenosis in the coronary arteries.  CAC 0.  No significant extracardiac findings in the thorax. Exercise tolerance test (05/17/2017): Immediate risk study with 1 mm horizontal ST depression in the inferolateral leads.  Rare PVCs during stress.  Good exercise capacity. TTE (08/12/2013, [place]): Normal LV size with mild LVH.  LVEF 60% with normal wall motion.  Normal RV size and function.  Trivial MR.  Labs/Other Tests and Data Reviewed:    EKG:  An ECG dated 04/23/18 was personally reviewed today and demonstrated:  SR with PVC  Recent Labs: 01/13/2019: ALT 29; BUN 11; Creatinine, Ser 1.04; Hemoglobin 14.0; Platelets 301.0; Potassium 3.6; Sodium 140   Recent Lipid Panel Lab Results  Component Value Date/Time   CHOL 157 01/13/2019 10:07 AM   CHOL 163 05/17/2017 08:19 AM   TRIG 53.0 01/13/2019 10:07 AM   HDL 45.30 01/13/2019 10:07 AM   HDL 59 05/17/2017 08:19 AM   CHOLHDL 3 01/13/2019 10:07 AM   LDLCALC 101 (H) 01/13/2019 10:07 AM   LDLCALC 92 05/17/2017 08:19 AM    Wt Readings from Last 3 Encounters:  05/01/19 285 lb (129.3 kg)  01/13/19 286 lb (129.7 kg)  09/27/18 296 lb (134.3 kg)     Objective:    Vital Signs:  Ht 6\' 1"  (1.854 m)   Wt 285 lb (129.3 kg)   BMI 37.60 kg/m    VITAL SIGNS:  reviewed EYES:  sclerae anicteric, EOMI - Extraocular Movements Intact RESPIRATORY:  normal respiratory effort, symmetric expansion NEURO:  alert and oriented x 3, no obvious focal deficit  ASSESSMENT & PLAN:    1. PAF - CHADSVASC score of 1 (HTN). Per Dr. Darnelle Bos previous recommendation due  to low CHADSVASC score and multiple years without recurrence he is maintained off aspirin and off anticoagulation. No evidence of recurrence. Continue Coreg 12.5mg  BID.  2. HTN - BP well controlled. Low sodium diet encouraged. Regular cardiovascular exercise encouraged. Continue Amlodipine 10mg  daily, Coreg 12.5mg  BID, Losartan 25mg  daily. Refill of Losartan provided today.   3. OSA - CPAP compliance encouraged.  4. Obesity - Weight loss via diet and exercise discussed today. Discussed techniques to reduce portion sizes.    COVID-19 Education: The signs and symptoms of COVID-19 were discussed with the patient and how to seek care for testing (follow up with PCP or arrange E-visit).  The importance of social distancing was discussed today.  Time:   Today, I have spent 15 minutes with the patient with telehealth technology discussing the above problems.     Medication Adjustments/Labs and Tests Ordered: Current medicines are reviewed at length with the  patient today.  Concerns regarding medicines are outlined above.   Tests Ordered: No orders of the defined types were placed in this encounter.   Medication Changes: No orders of the defined types were placed in this encounter.   Follow Up: Follow up  In Person in 1 year(s) with Dr. Okey Dupre or APP.  Signed, Alver Sorrow, NP  05/01/2019 10:04 AM    [facility]

## 2019-07-03 ENCOUNTER — Other Ambulatory Visit: Payer: Self-pay | Admitting: Internal Medicine

## 2019-07-03 MED ORDER — AMLODIPINE BESYLATE 10 MG PO TABS: ORAL_TABLET | ORAL | 2 refills | Status: DC

## 2019-07-03 NOTE — Telephone Encounter (Signed)
*  STAT* If patient is at the pharmacy, call can be transferred to refill team.   1. Which medications need to be refilled? (please list name of each medication and dose if known)  Amlodipine  2. Which pharmacy/location (including street and city if local pharmacy) is medication to be sent to? [facility] [address]  3. Do they need a 30 day or 90 day supply? 90

## 2019-07-30 ENCOUNTER — Ambulatory Visit: Payer: 59 | Attending: Internal Medicine

## 2019-08-05 ENCOUNTER — Ambulatory Visit: Payer: 59 | Attending: Internal Medicine

## 2019-08-05 DIAGNOSIS — Z23 Encounter for immunization: Secondary | ICD-10-CM

## 2019-08-05 NOTE — Progress Notes (Signed)
   Covid-19 Vaccination Clinic  Name:  Yadir Zentner    MRN: [id number] DOB: May 05, 1980  08/05/2019  Mr. Wyndham was observed post Covid-19 immunization for 30 minutes based on pre-vaccination screening without incident. He was provided with Vaccine Information Sheet and instruction to access the V-Safe system.   Mr. Nick was instructed to call 911 with any severe reactions post vaccine: Marland Kitchen Difficulty breathing  . Swelling of face and throat  . A fast heartbeat  . A bad rash all over body  . Dizziness and weakness   Immunizations Administered    Name Date Dose VIS Date Route   Pfizer COVID-19 Vaccine 08/05/2019 10:35 AM 0.3 mL 05/07/2018 Intramuscular   Manufacturer: [employer], [employer]   Lot: [id number]   NDC: [address]-1

## 2019-08-26 ENCOUNTER — Ambulatory Visit: Payer: 59 | Attending: Internal Medicine

## 2019-08-26 DIAGNOSIS — Z23 Encounter for immunization: Secondary | ICD-10-CM

## 2019-08-26 NOTE — Progress Notes (Signed)
   Covid-19 Vaccination Clinic  Name:  Zachary Cruz    MRN: [id number] DOB: [birth year]  08/26/2019  Zachary Cruz was observed post Covid-19 immunization for 15 minutes without incident. He was provided with Vaccine Information Sheet and instruction to access the V-Safe system.   Zachary Cruz was instructed to call 911 with any severe reactions post vaccine: Marland Kitchen Difficulty breathing  . Swelling of face and throat  . A fast heartbeat  . A bad rash all over body  . Dizziness and weakness   Immunizations Administered    Name Date Dose VIS Date Route   Pfizer COVID-19 Vaccine 08/26/2019  8:36 AM 0.3 mL 05/07/2018 Intramuscular   Manufacturer: [employer], [employer]   Lot: [id number]   NDC: [address]-9

## 2019-09-25 ENCOUNTER — Telehealth: Payer: Self-pay

## 2019-09-25 NOTE — Telephone Encounter (Signed)
[facility] Night - Client Nonclinical Telephone Record AccessNurse Client [facility] Night - Client Client Site [facility] - Night Physician Nicki Reaper - NP Contact Type Call Who Is Calling Patient / Member / Family / Caregiver Caller Name Sandford Diop Caller Phone Number [phone] Patient Name Zachary Cruz Patient DOB 1981/02/20 Call Type Message Only Information Provided Reason for Call Request to Schedule Office Appointment Initial Comment Caller states his new employer gave him a skin TB test and a chest x-ray. The chest x-ray says it is not active. He needs to follow up with his PCP. Additional Comment Declined triage. Advised caller to call back. Hours provided. Disp. Time Disposition Final User 09/24/2019 6:57:25 PM General Information Provided Yes Alyse Low Call Closed By: Alyse Low Transaction Date/Time: 09/24/2019 6:54:08 PM (ET)

## 2019-10-25 ENCOUNTER — Other Ambulatory Visit: Payer: Self-pay | Admitting: Internal Medicine

## 2019-11-17 ENCOUNTER — Other Ambulatory Visit: Payer: Self-pay | Admitting: Internal Medicine

## 2020-01-13 NOTE — Telephone Encounter (Signed)
Pt has appt on 01/15/20 for CPX.

## 2020-01-15 ENCOUNTER — Other Ambulatory Visit: Payer: Self-pay

## 2020-01-15 ENCOUNTER — Ambulatory Visit (INDEPENDENT_AMBULATORY_CARE_PROVIDER_SITE_OTHER): Payer: 59 | Admitting: Internal Medicine

## 2020-01-15 ENCOUNTER — Encounter: Payer: Self-pay | Admitting: Internal Medicine

## 2020-01-15 VITALS — BP 132/86 | HR 75 | Temp 98.6°F | Resp 16 | Ht 72.0 in | Wt 305.0 lb

## 2020-01-15 DIAGNOSIS — I480 Paroxysmal atrial fibrillation: Secondary | ICD-10-CM

## 2020-01-15 DIAGNOSIS — F320 Major depressive disorder, single episode, mild: Secondary | ICD-10-CM

## 2020-01-15 DIAGNOSIS — I10 Essential (primary) hypertension: Secondary | ICD-10-CM

## 2020-01-15 DIAGNOSIS — G4733 Obstructive sleep apnea (adult) (pediatric): Secondary | ICD-10-CM

## 2020-01-15 DIAGNOSIS — Z0000 Encounter for general adult medical examination without abnormal findings: Secondary | ICD-10-CM | POA: Diagnosis not present

## 2020-01-15 LAB — COMPREHENSIVE METABOLIC PANEL
ALT: 22 U/L (ref 0–53)
AST: 16 U/L (ref 0–37)
Albumin: 4.1 g/dL (ref 3.5–5.2)
Alkaline Phosphatase: 80 U/L (ref 39–117)
BUN: 12 mg/dL (ref 6–23)
CO2: 33 mEq/L — ABNORMAL HIGH (ref 19–32)
Calcium: 9.4 mg/dL (ref 8.4–10.5)
Chloride: 105 mEq/L (ref 96–112)
Creatinine, Ser: 0.98 mg/dL (ref 0.40–1.50)
GFR: 97.33 mL/min (ref >60.00)
Glucose, Bld: 108 mg/dL — ABNORMAL HIGH (ref 70–99)
Potassium: 3.7 mEq/L (ref 3.5–5.1)
Sodium: 142 mEq/L (ref 135–145)
Total Bilirubin: 0.4 mg/dL (ref 0.2–1.2)
Total Protein: 6.9 g/dL (ref 6.0–8.3)

## 2020-01-15 LAB — LIPID PANEL
Cholesterol: 155 mg/dL (ref 0–200)
HDL: 49.8 mg/dL (ref >39.00)
LDL Cholesterol: 88 mg/dL (ref 0–99)
NonHDL: 105.57
Total CHOL/HDL Ratio: 3
Triglycerides: 86 mg/dL (ref 0.0–149.0)
VLDL: 17.2 mg/dL (ref 0.0–40.0)

## 2020-01-15 LAB — CBC
HCT: 41.2 % (ref 39.0–52.0)
Hemoglobin: 13.8 g/dL (ref 13.0–17.0)
MCHC: 33.4 g/dL (ref 30.0–36.0)
MCV: 81.2 fl (ref 78.0–100.0)
Platelets: 300 10*3/uL (ref 150.0–400.0)
RBC: 5.07 Mil/uL (ref 4.22–5.81)
RDW: 13.1 % (ref 11.5–15.5)
WBC: 5.5 10*3/uL (ref 4.0–10.5)

## 2020-01-15 LAB — HEMOGLOBIN A1C: Hgb A1c MFr Bld: 6.1 % (ref 4.6–6.5)

## 2020-01-15 NOTE — Patient Instructions (Signed)

## 2020-01-15 NOTE — Assessment & Plan Note (Signed)
>>  ASSESSMENT AND PLAN FOR HTN (HYPERTENSION) WRITTEN ON 01/15/2020  9:57 AM BY Nicki Reaper W, NP  Controlled on Losartan, Amlodipine and Carvedilol Reinforced DASH diet and exercise for weight loss CMET today Will monitor

## 2020-01-15 NOTE — Assessment & Plan Note (Signed)
He will continue seeing his therapist He is not interested in med management Support offered

## 2020-01-15 NOTE — Assessment & Plan Note (Signed)
No issues Continue Carvedilol No anticoagulation necessary

## 2020-01-15 NOTE — Assessment & Plan Note (Signed)
>>  ASSESSMENT AND PLAN FOR PAROXYSMAL ATRIAL FIBRILLATION (HCC) WRITTEN ON 01/15/2020  9:56 AM BY BAITY, REGINA W, NP  No issues Continue Carvedilol No anticoagulation necessary

## 2020-01-15 NOTE — Progress Notes (Signed)
Subjective:    Patient ID: Zachary Cruz, male    DOB: 1980/06/03, 39 y.o.   MRN: [id number]  HPI  Pt presents to the clinic today for his annual exam. He is also due to follow up chronic conditions.  Afib: Paroxysmal. Managed with Carvedilol. He is not on any anticoagulation at this time. He follows with cardiology.  HTN: His BP today is 132/86. He is taking Losartan,  Amlodipine and Carvedilol as prescribed. ECG from 04/2018 reviewed.  OSA: He averages 5 hours of sleep per night with the use of his CPAP. Sleep study from 03/2017 reviewed.  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 He is seeing a therapist. He is not taking any medications for this. He denies anxiety, SI/HI.  Flu: 12/2018 Tetanus: 05/2017 Covid: [employer] Dentist: as needed  Diet: He does eat meat. He consumes fruits and veggies daily. He does eat some fried foods. He drinks mostly water, soda. Exercise: None  Review of Systems      Past Medical History:  Diagnosis Date  . Atrial fibrillation (HCC)   . Childhood asthma   . Hypertension   . Sleep apnea     Current Outpatient Medications  Medication Sig Dispense Refill  . amLODipine (NORVASC) 10 MG tablet TAKE 1 TABLET(10 MG) BY MOUTH DAILY. 90 tablet 2  . carvedilol (COREG) 12.5 MG tablet Take 1 tablet (12.5 mg total) by mouth 2 (two) times daily with a meal. SCHEDULE PHYSICAL 180 tablet 0  . cetirizine (ZYRTEC) 10 MG tablet Take 1 tablet (10 mg) by mouth once daily    . losartan (COZAAR) 25 MG tablet Take 1 tablet (25 mg total) by mouth daily. 90 tablet 3  . Multiple Vitamins-Minerals (DAILY MULTIPLE VITAMINS/MIN PO) Take 1 tablet by mouth once daily     No current facility-administered medications for this visit.    No Known Allergies  Family History  Problem Relation Age of Onset  . Diabetes Father   . Mental illness Father   . Hypertension Maternal Grandfather     Social History   Socioeconomic History  . Marital status: Married     Spouse name: Not on file  . Number of children: Not on file  . Years of education: Not on file  . Highest education level: Not on file  Occupational History  . Not on file  Tobacco Use  . Smoking status: Former Smoker    Packs/day: 0.10    Years: 7.00    Pack years: 0.70    Types: Cigarettes    Quit date: 04/2003    Years since quitting: 16.7  . Smokeless tobacco: Never Used  Vaping Use  . Vaping Use: Never used  Substance and Sexual Activity  . Alcohol use: No  . Drug use: No    Comment: Marijuana as a teenager.  Marland Kitchen Sexual activity: Yes  Other Topics Concern  . Not on file  Social History Narrative  . Not on file   Social Determinants of Health   Financial Resource Strain:   . Difficulty of Paying Living Expenses: Not on file  Food Insecurity:   . Worried About Programme researcher, broadcasting/film/video in the Last Year: Not on file  . Ran Out of Food in the Last Year: Not on file  Transportation Needs:   . Lack of Transportation (Medical): Not on file  . Lack of Transportation (Non-Medical): Not on file  Physical Activity:   . Days of Exercise per Week: Not on file  . Minutes  of Exercise per Session: Not on file  Stress:   . Feeling of Stress : Not on file  Social Connections:   . Frequency of Communication with Friends and Family: Not on file  . Frequency of Social Gatherings with Friends and Family: Not on file  . Attends Religious Services: Not on file  . Active Member of Clubs or Organizations: Not on file  . Attends Banker Meetings: Not on file  . Marital Status: Not on file  Intimate Partner Violence:   . Fear of Current or Ex-Partner: Not on file  . Emotionally Abused: Not on file  . Physically Abused: Not on file  . Sexually Abused: Not on file     Constitutional: Denies fever, malaise, fatigue, headache or abrupt weight changes.  HEENT: Denies eye pain, eye redness, ear pain, ringing in the ears, wax buildup, runny nose, nasal congestion, bloody nose, or  sore throat. Respiratory: Denies difficulty breathing, shortness of breath, cough or sputum production.   Cardiovascular: Denies chest pain, chest tightness, palpitations or swelling in the hands or feet.  Gastrointestinal: Denies abdominal pain, bloating, constipation, diarrhea or blood in the stool.  GU: Denies urgency, frequency, pain with urination, burning sensation, blood in urine, odor or discharge. Musculoskeletal: Denies decrease in range of motion, difficulty with gait, muscle pain or joint pain and swelling.  Skin: Denies redness, rashes, lesions or ulcercations.  Neurological: Denies dizziness, difficulty with memory, difficulty with speech or problems with balance and coordination.  Psych: Pt has a history of depression. Denies anxiety, SI/HI.  No other specific complaints in a complete review of systems (except as listed in HPI above).  Objective:   Physical Exam  BP 132/86 (BP Location: Left Arm, Patient Position: Sitting, Cuff Size: Large)   Pulse 75   Temp 98.6 F (37 C) (Temporal)   Resp 16   Ht 6' (1.829 m)   Wt (!) 305 lb (138.3 kg)   SpO2 97%   BMI 41.37 kg/m   Wt Readings from Last 3 Encounters:  05/01/19 285 lb (129.3 kg)  01/13/19 286 lb (129.7 kg)  09/27/18 296 lb (134.3 kg)    General: Appears his stated age, obese, in NAD. Skin: Warm, dry and intact. No rashes, lesions or ulcerations noted. HEENT: Head: normal shape and size; Eyes: sclera white, no icterus, conjunctiva pink, PERRLA and EOMs intact;  Neck:  Neck supple, trachea midline. No masses, lumps or thyromegaly present.  Cardiovascular: Normal rate and rhythm. S1,S2 noted.  No murmur, rubs or gallops noted. No JVD or BLE edema.  Pulmonary/Chest: Normal effort and positive vesicular breath sounds. No respiratory distress. No wheezes, rales or ronchi noted.  Abdomen: Soft and nontender. Normal bowel sounds. No distention or masses noted. Liver, spleen and kidneys non palpable. Musculoskeletal:  Strength 5/5 BUE/BLE. No difficulty with gait.  Neurological: Alert and oriented. Cranial nerves II-XII grossly intact. Coordination normal.  Psychiatric: Mood and affect normal. Behavior is normal. Judgment and thought content normal.    BMET    Component Value Date/Time   NA 140 01/13/2019 1007   K 3.6 01/13/2019 1007   CL 103 01/13/2019 1007   CO2 29 01/13/2019 1007   GLUCOSE 98 01/13/2019 1007   BUN 11 01/13/2019 1007   CREATININE 1.04 01/13/2019 1007   CALCIUM 9.0 01/13/2019 1007   GFRNONAA >60 11/02/2016 1043   GFRAA >60 11/02/2016 1043    Lipid Panel     Component Value Date/Time   CHOL 157 01/13/2019 1007  CHOL 163 05/17/2017 0819   TRIG 53.0 01/13/2019 1007   HDL 45.30 01/13/2019 1007   HDL 59 05/17/2017 0819   CHOLHDL 3 01/13/2019 1007   VLDL 10.6 01/13/2019 1007   LDLCALC 101 (H) 01/13/2019 1007   LDLCALC 92 05/17/2017 0819    CBC    Component Value Date/Time   WBC 5.9 01/13/2019 1007   RBC 5.08 01/13/2019 1007   HGB 14.0 01/13/2019 1007   HCT 42.0 01/13/2019 1007   PLT 301.0 01/13/2019 1007   MCV 82.7 01/13/2019 1007   MCH 27.8 11/02/2016 1043   MCHC 33.2 01/13/2019 1007   RDW 12.8 01/13/2019 1007   LYMPHSABS 2.2 11/02/2016 1043   MONOABS 0.5 11/02/2016 1043   EOSABS 0.5 11/02/2016 1043   BASOSABS 0.0 11/02/2016 1043    Hgb A1C Lab Results  Component Value Date   HGBA1C 5.4 01/13/2019           Assessment & Plan:   Preventative Health Maintenance:  He declines flu shot today Tetanus UTD Covid vaccine UTD Encouraged him to consume a balanced diet and exercise regimen Advised him to see an eye doctor and dentist annually Will check CBC, CMET, Lipid and A1C today  RTC in 1 year, sooner if needed Nicki Reaper, NP This visit occurred during the SARS-CoV-2 public health emergency.  Safety protocols were in place, including screening questions prior to the visit, additional usage of staff PPE, and extensive cleaning of exam room while  observing appropriate contact time as indicated for disinfecting solutions.

## 2020-01-15 NOTE — Assessment & Plan Note (Signed)
Controlled on Losartan, Amlodipine and Carvedilol Reinforced DASH diet and exercise for weight loss CMET today Will monitor

## 2020-01-15 NOTE — Assessment & Plan Note (Signed)
Encouraged weight loss Continue CPAP 

## 2020-02-02 ENCOUNTER — Telehealth: Payer: Self-pay | Admitting: Internal Medicine

## 2020-02-02 NOTE — Telephone Encounter (Signed)
Patient calling to discuss Warm sensation behind R knee and twinges in chest.   Scheduled for 11/24 End At 10 but he would like to also discuss with nurse prior to visit.  ?

## 2020-02-02 NOTE — Telephone Encounter (Signed)
Spoke to patient. Reports he had chest discomfort that felt like gas/indigestion and he felt hungry. He ate and then he felt better and the pain has not returned. This was the first day he had pain.  His knee started hurting today. Right inner knee.  Denies warmth, redness, or swelling.  He would like to cancel the appointment for Wednesday now since he is no longer having the pain. He will let us know if the pain returns or any other issues arise.  Advised patient to contact PCP regarding knee discomfort for further evaluation. Patient reports he is exercising lately.

## 2020-02-04 ENCOUNTER — Ambulatory Visit: Payer: 59 | Admitting: Internal Medicine

## 2020-02-25 ENCOUNTER — Telehealth: Payer: Self-pay | Admitting: Internal Medicine

## 2020-02-25 NOTE — Telephone Encounter (Signed)
Patient is needing a medication refill on  Carvedilol  Please send to [facility] on  in [place]  [place]

## 2020-02-26 ENCOUNTER — Encounter: Payer: Self-pay | Admitting: Internal Medicine

## 2020-02-26 MED ORDER — CARVEDILOL 12.5 MG PO TABS
12.5000 mg | ORAL_TABLET | Freq: Two times a day (BID) | ORAL | 1 refills | Status: DC
Start: 2020-02-26 — End: 2020-07-28

## 2020-03-18 ENCOUNTER — Encounter (HOSPITAL_BASED_OUTPATIENT_CLINIC_OR_DEPARTMENT_OTHER): Payer: Self-pay | Admitting: *Deleted

## 2020-03-18 ENCOUNTER — Other Ambulatory Visit: Payer: Self-pay

## 2020-03-18 ENCOUNTER — Emergency Department (HOSPITAL_BASED_OUTPATIENT_CLINIC_OR_DEPARTMENT_OTHER)
Admission: EM | Admit: 2020-03-18 | Discharge: 2020-03-18 | Disposition: A | Discharge status: Home / Self Care | Payer: 59 | Attending: Emergency Medicine | Admitting: Emergency Medicine

## 2020-03-18 ENCOUNTER — Emergency Department (HOSPITAL_BASED_OUTPATIENT_CLINIC_OR_DEPARTMENT_OTHER): Payer: 59

## 2020-03-18 DIAGNOSIS — R0602 Shortness of breath: Secondary | ICD-10-CM | POA: Insufficient documentation

## 2020-03-18 DIAGNOSIS — R079 Chest pain, unspecified: Secondary | ICD-10-CM | POA: Diagnosis present

## 2020-03-18 DIAGNOSIS — I10 Essential (primary) hypertension: Secondary | ICD-10-CM | POA: Insufficient documentation

## 2020-03-18 DIAGNOSIS — Z87891 Personal history of nicotine dependence: Secondary | ICD-10-CM | POA: Diagnosis not present

## 2020-03-18 DIAGNOSIS — Z79899 Other long term (current) drug therapy: Secondary | ICD-10-CM | POA: Diagnosis not present

## 2020-03-18 LAB — CBC WITH DIFFERENTIAL/PLATELET
Abs Immature Granulocytes: 0.01 10*3/uL (ref 0.00–0.07)
Basophils Absolute: 0.1 10*3/uL (ref 0.0–0.1)
Basophils Relative: 1 %
Eosinophils Absolute: 0.4 10*3/uL (ref 0.0–0.5)
Eosinophils Relative: 6 %
HCT: 40.3 % (ref 39.0–52.0)
Hemoglobin: 13.8 g/dL (ref 13.0–17.0)
Immature Granulocytes: 0 %
Lymphocytes Relative: 37 %
Lymphs Abs: 2.5 10*3/uL (ref 0.7–4.0)
MCH: 28 pg (ref 26.0–34.0)
MCHC: 34.2 g/dL (ref 30.0–36.0)
MCV: 81.9 fL (ref 80.0–100.0)
Monocytes Absolute: 0.4 10*3/uL (ref 0.1–1.0)
Monocytes Relative: 6 %
Neutro Abs: 3.5 10*3/uL (ref 1.7–7.7)
Neutrophils Relative %: 50 %
Platelets: 318 10*3/uL (ref 150–400)
RBC: 4.92 MIL/uL (ref 4.22–5.81)
RDW: 12.2 % (ref 11.5–15.5)
WBC: 6.8 10*3/uL (ref 4.0–10.5)
nRBC: 0 % (ref 0.0–0.2)

## 2020-03-18 LAB — COMPREHENSIVE METABOLIC PANEL
ALT: 25 U/L (ref 0–44)
AST: 17 U/L (ref 15–41)
Albumin: 4.3 g/dL (ref 3.5–5.0)
Alkaline Phosphatase: 78 U/L (ref 38–126)
Anion gap: 9 (ref 5–15)
BUN: 10 mg/dL (ref 6–20)
CO2: 25 mmol/L (ref 22–32)
Calcium: 8.8 mg/dL — ABNORMAL LOW (ref 8.9–10.3)
Chloride: 105 mmol/L (ref 98–111)
Creatinine, Ser: 1.01 mg/dL (ref 0.61–1.24)
GFR, Estimated: >60 mL/min (ref >60)
Glucose, Bld: 97 mg/dL (ref 70–99)
Potassium: 3.3 mmol/L — ABNORMAL LOW (ref 3.5–5.1)
Sodium: 139 mmol/L (ref 135–145)
Total Bilirubin: 0.2 mg/dL — ABNORMAL LOW (ref 0.3–1.2)
Total Protein: 7.5 g/dL (ref 6.5–8.1)

## 2020-03-18 LAB — TROPONIN I (HIGH SENSITIVITY): Troponin I (High Sensitivity): 6 ng/L (ref <18)

## 2020-03-18 NOTE — Discharge Instructions (Signed)
Take all your blood pressure medications as prescribed.  You may take an aspirin daily as well.  Call cardiology for follow-up.  Return to the ER if you have any recurrent chest pain symptoms.

## 2020-03-18 NOTE — ED Provider Notes (Signed)
[facility] EMERGENCY DEPARTMENT Provider Note   CSN: [id number] Arrival date & time: 03/18/20  2025     History Chief Complaint  Patient presents with  . Chest Pain    Zachary Cruz is a 40 y.o. male.  Patient presents to the emergency department for evaluation of chest pain.  Patient reports that symptoms have been ongoing for more than 2 days.  He reports that he noticed an increase in his pain today which prompted him to come to the ER.  While waiting in the waiting room, however, pain has resolved.  He did report some slight shortness of breath with the symptoms previously, not currently present.  No history of CAD.  He does have a history of hypertension, no high cholesterol, diabetes, first-degree family history of heart disease.  Patient denies history of blood clots.  He has not had any recent surgery, no long distance travel, no unilateral leg pain or swelling.        Past Medical History:  Diagnosis Date  . Atrial fibrillation ([place])   . Childhood asthma   . Hypertension   . Sleep apnea     Patient Active Problem List   Diagnosis Date Noted  . Depression, major, single episode, mild ([place]) 01/13/2019  . OSA (obstructive sleep apnea) 01/10/2018  . Paroxysmal atrial fibrillation ([place]) 10/23/2016  . HTN (hypertension) 10/23/2016    Past Surgical History:  Procedure Laterality Date  . CHOLECYSTECTOMY N/A 11/08/2016   Procedure: LAPAROSCOPIC CHOLECYSTECTOMY;  Surgeon: Florene Glen, MD;  Location: [facility] ORS;  Service: General;  Laterality: N/A;  . TONSILLECTOMY         Family History  Problem Relation Age of Onset  . Diabetes Father   . Mental illness Father   . Hypertension Maternal Grandfather     Social History   Tobacco Use  . Smoking status: Former Smoker    Packs/day: 0.10    Years: 7.00    Pack years: 0.70    Types: Cigarettes    Quit date: 04/2003    Years since quitting: 16.9  . Smokeless tobacco: Never Used  Vaping Use   . Vaping Use: Never used  Substance Use Topics  . Alcohol use: No  . Drug use: No    Comment: Marijuana as a teenager.    Home Medications Prior to Admission medications   Medication Sig Start Date End Date Taking? Authorizing Provider  amLODipine (NORVASC) 10 MG tablet TAKE 1 TABLET(10 MG) BY MOUTH DAILY. 07/03/19   End, Harrell Gave, MD  carvedilol (COREG) 12.5 MG tablet Take 1 tablet (12.5 mg total) by mouth 2 (two) times daily with a meal. 02/26/20   Baity, Coralie Keens, NP  cetirizine (ZYRTEC) 10 MG tablet Take 1 tablet (10 mg) by mouth once daily    [provider]  losartan (COZAAR) 25 MG tablet Take 1 tablet (25 mg total) by mouth daily. 05/01/19   Loel Dubonnet, NP  Multiple Vitamins-Minerals (DAILY MULTIPLE VITAMINS/MIN PO) Take 1 tablet by mouth once daily    [provider]    Allergies    Patient has no known allergies.  Review of Systems   Review of Systems  Respiratory: Positive for shortness of breath.   Cardiovascular: Positive for chest pain.  All other systems reviewed and are negative.   Physical Exam Updated Vital Signs BP (!) 143/94   Pulse 64   Temp 98.2 F (36.8 C) (Oral)   Resp 15   Ht 6\' 1"  (  1.854 m)   Wt 136.1 kg   SpO2 97%   BMI 39.58 kg/m   Physical Exam Vitals and nursing note reviewed.  Constitutional:      General: He is not in acute distress.    Appearance: Normal appearance. He is well-developed and well-nourished.  HENT:     Head: Normocephalic and atraumatic.     Right Ear: Hearing normal.     Left Ear: Hearing normal.     Nose: Nose normal.     Mouth/Throat:     Mouth: Oropharynx is clear and moist and mucous membranes are normal.  Eyes:     Extraocular Movements: EOM normal.     Conjunctiva/sclera: Conjunctivae normal.     Pupils: Pupils are equal, round, and reactive to light.  Cardiovascular:     Rate and Rhythm: Regular rhythm.     Heart sounds: S1 normal and S2 normal. No murmur heard. No friction  rub. No gallop.   Pulmonary:     Effort: Pulmonary effort is normal. No respiratory distress.     Breath sounds: Normal breath sounds.  Chest:     Chest wall: No tenderness.  Abdominal:     General: Bowel sounds are normal.     Palpations: Abdomen is soft. There is no hepatosplenomegaly.     Tenderness: There is no abdominal tenderness. There is no guarding or rebound. Negative signs include Murphy's sign and McBurney's sign.     Hernia: No hernia is present.  Musculoskeletal:        General: Normal range of motion.     Cervical back: Normal range of motion and neck supple.  Skin:    General: Skin is warm, dry and intact.     Findings: No rash.     Nails: There is no cyanosis.  Neurological:     Mental Status: He is alert and oriented to person, place, and time.     GCS: GCS eye subscore is 4. GCS verbal subscore is 5. GCS motor subscore is 6.     Cranial Nerves: No cranial nerve deficit.     Sensory: No sensory deficit.     Coordination: Coordination normal.     Deep Tendon Reflexes: Strength normal.  Psychiatric:        Mood and Affect: Mood and affect normal.        Speech: Speech normal.        Behavior: Behavior normal.        Thought Content: Thought content normal.     ED Results / Procedures / Treatments   Labs (all labs ordered are listed, but only abnormal results are displayed) Labs Reviewed  COMPREHENSIVE METABOLIC PANEL - Abnormal; Notable for the following components:      Result Value   Potassium 3.3 (*)    Calcium 8.8 (*)    Total Bilirubin 0.2 (*)    All other components within normal limits  CBC WITH DIFFERENTIAL/PLATELET  TROPONIN I (HIGH SENSITIVITY)    EKG EKG Interpretation  Date/Time:  Thursday March 18 2020 20:29:18 EST Ventricular Rate:  65 PR Interval:  160 QRS Duration: 100 QT Interval:  378 QTC Calculation: 393 R Axis:   18 Text Interpretation: Normal sinus rhythm with sinus arrhythmia Moderate voltage criteria for LVH, may be  normal variant ( R in aVL , Cornell product ) Nonspecific T wave abnormality Abnormal ECG Confirmed by Gilda Crease [id number]) on 03/18/2020 11:18:00 PM   Radiology DG Chest 2 View  Result Date: 03/18/2020  CLINICAL DATA:  Midsternal chest pain x3 days with shortness of breath. EXAM: CHEST - 2 VIEW COMPARISON:  None. FINDINGS: The heart size and mediastinal contours are within normal limits. Both lungs are clear. The visualized skeletal structures are unremarkable. Radiopaque surgical clips are seen within the right upper quadrant. IMPRESSION: No active cardiopulmonary disease. Electronically Signed   By: Aram Candela M.D.   On: 03/18/2020 20:50    Procedures Procedures (including critical care time)  Medications Ordered in ED Medications - No data to display  ED Course  I have reviewed the triage vital signs and the nursing notes.  Pertinent labs & imaging results that were available during my care of the patient were reviewed by me and considered in my medical decision making (see chart for details).    MDM Rules/Calculators/A&P                          Patient presents to the emergency department for evaluation of chest pain has been ongoing for more than 2 days.  Pain has been continuous without relief, although it did resolve while in the waiting room.  Patient does not have any coronary artery history but does have a history of A. fib.  He is not anticoagulated.  He is in sinus rhythm today.  EKG with LVH but no ischemic changes.  Troponin negative.  As the patient has had continuous pain for 2 days, I do not feel he requires a second troponin.  He has now symptom-free and therefore I feel it is reasonable for him to follow-up with cardiology as an outpatient.  He has recently moved from [facility] and needs to establish with a new cardiologist.  He was given a referral for [facility] cardi[facility].  He is to return to the ER for any recurrent symptoms. Final Clinical  Impression(s) / ED Diagnoses Final diagnoses:  Chest pain, unspecified type    Rx / DC Orders ED Discharge Orders    None       Gilda Crease, MD 03/18/20 2340

## 2020-03-18 NOTE — ED Notes (Signed)
Patient transported to X-ray 

## 2020-03-18 NOTE — ED Triage Notes (Signed)
C/o mid sternal constant CP x 3 days , SOb also.

## 2020-03-23 ENCOUNTER — Telehealth: Payer: Self-pay | Admitting: Internal Medicine

## 2020-03-23 NOTE — Telephone Encounter (Signed)
That is fine with me.  Tykesha Konicki, MD [facility]  

## 2020-03-23 NOTE — Telephone Encounter (Signed)
Patient is requesting to switch from Dr. Okey Dupre to Dr. Servando Salina due to the fact that he has moved to [facility].

## 2020-03-23 NOTE — Telephone Encounter (Signed)
That's ok with me.

## 2020-04-08 ENCOUNTER — Other Ambulatory Visit: Payer: Self-pay

## 2020-04-08 ENCOUNTER — Encounter: Payer: Self-pay | Admitting: Medical-Surgical

## 2020-04-08 ENCOUNTER — Ambulatory Visit (INDEPENDENT_AMBULATORY_CARE_PROVIDER_SITE_OTHER): Payer: 59

## 2020-04-08 ENCOUNTER — Ambulatory Visit (INDEPENDENT_AMBULATORY_CARE_PROVIDER_SITE_OTHER): Payer: 59 | Admitting: Medical-Surgical

## 2020-04-08 VITALS — BP 114/78 | HR 78 | Temp 98.7°F | Ht 74.0 in | Wt 300.5 lb

## 2020-04-08 DIAGNOSIS — R059 Cough, unspecified: Secondary | ICD-10-CM | POA: Diagnosis not present

## 2020-04-08 DIAGNOSIS — F320 Major depressive disorder, single episode, mild: Secondary | ICD-10-CM | POA: Diagnosis not present

## 2020-04-08 DIAGNOSIS — I480 Paroxysmal atrial fibrillation: Secondary | ICD-10-CM

## 2020-04-08 DIAGNOSIS — Z7689 Persons encountering health services in other specified circumstances: Secondary | ICD-10-CM | POA: Diagnosis not present

## 2020-04-08 DIAGNOSIS — I10 Essential (primary) hypertension: Secondary | ICD-10-CM

## 2020-04-08 DIAGNOSIS — R0789 Other chest pain: Secondary | ICD-10-CM

## 2020-04-08 MED ORDER — AZITHROMYCIN 250 MG PO TABS: ORAL_TABLET | ORAL | 0 refills | Status: DC

## 2020-04-08 MED ORDER — ALBUTEROL SULFATE HFA 108 (90 BASE) MCG/ACT IN AERS: 2.0000 | INHALATION_SPRAY | Freq: Four times a day (QID) | RESPIRATORY_TRACT | 0 refills | Status: DC | PRN

## 2020-04-08 NOTE — Progress Notes (Signed)
New Patient Office Visit  Subjective:  Patient ID: Zachary Cruz, male    DOB: [birth year]  Age: 40 y.o. MRN: [id number]  CC:  Chief Complaint  Patient presents with  . Establish Care    HPI Zachary Cruz presents to establish care.  Afib- diagnosed in 2015. Managed well with Carvedilol BID. No current symptoms or concerns.   HTN- taking Amlodipine and Losartan. BP well managed on current regimen.   Asthma- affected in childhood but symptoms resolved with age. No current treatment or issues.   Cough- had significant Covid-like symptoms approximately 2 weeks ago but tested negative.  His symptoms have resolved with the exception of a lingering cough productive of light green to yellow sputum with chest tightness.  Denies fevers, chills, significant shortness of breath, and chest pain.  Depression-history of depression with current situational increase in symptoms due to life changes.  Has never been treated with medicine for depression but did do counseling approximately 7 to 8 months in 2021.  He does still have a relationship with a counselor who he has been seeing since 2018 off and on.  Does not have a pending appointment at this time.  Past Medical History:  Diagnosis Date  . Atrial fibrillation (HCC)   . Childhood asthma   . Hypertension   . Sleep apnea     Past Surgical History:  Procedure Laterality Date  . CHOLECYSTECTOMY N/A 11/08/2016   Procedure: LAPAROSCOPIC CHOLECYSTECTOMY;  Surgeon: Lattie Haw, MD;  Location: [facility] ORS;  Service: General;  Laterality: N/A;  . TONSILLECTOMY      Family History  Problem Relation Age of Onset  . Diabetes Father   . Mental illness Father   . Hypertension Maternal Grandfather     Social History   Socioeconomic History  . Marital status: Married    Spouse name: Not on file  . Number of children: Not on file  . Years of education: Not on file  . Highest education level: Not on file  Occupational History  .  Not on file  Tobacco Use  . Smoking status: Former Smoker    Packs/day: 0.10    Years: 7.00    Pack years: 0.70    Types: Cigarettes    Quit date: 04/2003    Years since quitting: 16.9  . Smokeless tobacco: Never Used  Vaping Use  . Vaping Use: Never used  Substance and Sexual Activity  . Alcohol use: No  . Drug use: Not Currently    Comment: Marijuana as a teenager.  Marland Kitchen Sexual activity: Not Currently  Other Topics Concern  . Not on file  Social History Narrative  . Not on file   Social Determinants of Health   Financial Resource Strain: Not on file  Food Insecurity: Not on file  Transportation Needs: Not on file  Physical Activity: Not on file  Stress: Not on file  Social Connections: Not on file  Intimate Partner Violence: Not on file    ROS Review of Systems  Constitutional: Positive for fatigue and unexpected weight change. Negative for chills and fever.  Respiratory: Positive for cough and chest tightness. Negative for shortness of breath and wheezing.   Cardiovascular: Negative for chest pain, palpitations and leg swelling.  Neurological: Negative for dizziness, light-headedness and headaches.  Psychiatric/Behavioral: Positive for dysphoric mood. Negative for self-injury, sleep disturbance and suicidal ideas. The patient is not nervous/anxious.     Objective:   Today's Vitals: BP 114/78   Pulse 78  Temp 98.7 F (37.1 C)   Ht 6\' 2"  (1.88 m)   Wt (!) 300 lb 8 oz (136.3 kg)   SpO2 99%   BMI 38.58 kg/m   Physical Exam Vitals and nursing note reviewed.  Constitutional:      General: He is not in acute distress.    Appearance: Normal appearance.  HENT:     Head: Normocephalic and atraumatic.  Cardiovascular:     Rate and Rhythm: Normal rate and regular rhythm.     Pulses: Normal pulses.     Heart sounds: Normal heart sounds. No murmur heard. No friction rub. No gallop.   Pulmonary:     Effort: Pulmonary effort is normal. No respiratory distress.      Comments: Breath sounds clear but diminished in all lobes. Skin:    General: Skin is warm and dry.  Neurological:     Mental Status: He is alert and oriented to person, place, and time.  Psychiatric:        Mood and Affect: Mood normal.        Behavior: Behavior normal.        Thought Content: Thought content normal.        Judgment: Judgment normal.     Assessment & Plan:   1. Encounter to establish care Reviewed available information and discussed care concerns with patient.   2. Chest tightness/cough Chest x-ray today. With 2 weeks of symptoms and productive of green mucus in the setting of asthma history, treating with Azithromycin.  - DG Chest 2 View; Future  3. Depression, major, single episode, mild (HCC) Recommend contacting his therapist for an appointment since his symptoms have temporarily increased.   4. Primary hypertension Stable on Amlodipine, Losartan, and Carvedilol.   5. Paroxysmal atrial fibrillation (HCC) Stable on Carvedilol.   Outpatient Encounter Medications as of 04/08/2020  Medication Sig  . amLODipine (NORVASC) 10 MG tablet TAKE 1 TABLET(10 MG) BY MOUTH DAILY.  04/10/2020 azithromycin (ZITHROMAX) 250 MG tablet 2 tabs po x1 on Day 1, then 1 tab po daily on Days 2 - 5  . carvedilol (COREG) 12.5 MG tablet Take 1 tablet (12.5 mg total) by mouth 2 (two) times daily with a meal.  . cetirizine (ZYRTEC) 10 MG tablet Take 1 tablet (10 mg) by mouth once daily  . losartan (COZAAR) 25 MG tablet Take 1 tablet (25 mg total) by mouth daily.  . Multiple Vitamins-Minerals (DAILY MULTIPLE VITAMINS/MIN PO) Take 1 tablet by mouth once daily   No facility-administered encounter medications on file as of 04/08/2020.    Follow-up: Return in about 9 months (around 01/06/2021) for chronic disease follow up.   01/08/2021, DNP, APRN, FNP-BC  [facility]

## 2020-04-19 DIAGNOSIS — J45909 Unspecified asthma, uncomplicated: Secondary | ICD-10-CM | POA: Insufficient documentation

## 2020-04-19 DIAGNOSIS — I4891 Unspecified atrial fibrillation: Secondary | ICD-10-CM | POA: Insufficient documentation

## 2020-04-19 DIAGNOSIS — I10 Essential (primary) hypertension: Secondary | ICD-10-CM | POA: Insufficient documentation

## 2020-04-19 DIAGNOSIS — G4730 Sleep apnea, unspecified: Secondary | ICD-10-CM | POA: Insufficient documentation

## 2020-04-20 ENCOUNTER — Other Ambulatory Visit: Payer: Self-pay

## 2020-04-20 ENCOUNTER — Encounter: Payer: Self-pay | Admitting: Cardiology

## 2020-04-20 ENCOUNTER — Ambulatory Visit: Payer: 59 | Admitting: Cardiology

## 2020-04-20 VITALS — BP 146/86 | HR 78 | Ht 73.0 in | Wt 303.0 lb

## 2020-04-20 DIAGNOSIS — I480 Paroxysmal atrial fibrillation: Secondary | ICD-10-CM | POA: Diagnosis not present

## 2020-04-20 DIAGNOSIS — G4733 Obstructive sleep apnea (adult) (pediatric): Secondary | ICD-10-CM | POA: Diagnosis not present

## 2020-04-20 DIAGNOSIS — E669 Obesity, unspecified: Secondary | ICD-10-CM | POA: Diagnosis not present

## 2020-04-20 DIAGNOSIS — I10 Essential (primary) hypertension: Secondary | ICD-10-CM

## 2020-04-20 MED ORDER — LOSARTAN POTASSIUM 25 MG PO TABS: 25.0000 mg | ORAL_TABLET | Freq: Every day | ORAL | 3 refills | Status: DC

## 2020-04-20 MED ORDER — LOSARTAN POTASSIUM 25 MG PO TABS: 25.0000 mg | ORAL_TABLET | Freq: Two times a day (BID) | ORAL | 3 refills | Status: DC

## 2020-04-20 NOTE — Progress Notes (Signed)
Cardiology Office Note:    Date:  04/20/2020   ID:  Zachary Cruz, DOB 03-03-1981, MRN [id number]  PCP:  Christen Butter, NP  Cardiologist:  Yvonne Kendall, MD  Electrophysiologist:  None   Referring MD: Lorre Munroe, NP   I am doing fine  History of Present Illness:    Zachary Cruz is a 41 y.o. male with a hx of paroxysmal atrial fibrillation, hypertension, OSA on APAP, depression is here today for follow-up visit.  The patient however did see my partner Dr. Cristal Deer End.   The patient was last seen by Gillian Shields, NP via video visit at which time he appeared to be doing well from a cardiovascular standpoint. All of his medications were refilled. He is here today for follow-up visit he has moved recently to [place].  Here for no complaints at this time. He did have ED visit earlier this year but it was due to chest pain which he contributed to the fact that he had lots of beans and he was told that this may have been contributing.   Past Medical History:  Diagnosis Date  . Atrial fibrillation (HCC)   . Childhood asthma   . Hypertension   . Sleep apnea     Past Surgical History:  Procedure Laterality Date  . CHOLECYSTECTOMY N/A 11/08/2016   Procedure: LAPAROSCOPIC CHOLECYSTECTOMY;  Surgeon: Lattie Haw, MD;  Location: [facility] ORS;  Service: General;  Laterality: N/A;  . TONSILLECTOMY      Current Medications: Current Meds  Medication Sig  . albuterol (VENTOLIN HFA) 108 (90 Base) MCG/ACT inhaler Inhale 2 puffs into the lungs every 6 (six) hours as needed for wheezing.  Marland Kitchen amLODipine (NORVASC) 10 MG tablet TAKE 1 TABLET(10 MG) BY MOUTH DAILY.  . carvedilol (COREG) 12.5 MG tablet Take 1 tablet (12.5 mg total) by mouth 2 (two) times daily with a meal.  . cetirizine (ZYRTEC) 10 MG tablet Take 1 tablet (10 mg) by mouth once daily  . losartan (COZAAR) 25 MG tablet Take 1 tablet (25 mg total) by mouth in the morning and at bedtime.  . Multiple  Vitamins-Minerals (DAILY MULTIPLE VITAMINS/MIN PO) Take 1 tablet by mouth once daily  . [DISCONTINUED] azithromycin (ZITHROMAX) 250 MG tablet 2 tabs po x1 on Day 1, then 1 tab po daily on Days 2 - 5  . [DISCONTINUED] losartan (COZAAR) 25 MG tablet Take 1 tablet (25 mg total) by mouth daily.     Allergies:   Patient has no known allergies.   Social History   Socioeconomic History  . Marital status: Legally Separated    Spouse name: Not on file  . Number of children: Not on file  . Years of education: Not on file  . Highest education level: Not on file  Occupational History  . Not on file  Tobacco Use  . Smoking status: Former Smoker    Packs/day: 0.10    Years: 7.00    Pack years: 0.70    Types: Cigarettes    Quit date: 04/2003    Years since quitting: 17.0  . Smokeless tobacco: Never Used  Vaping Use  . Vaping Use: Never used  Substance and Sexual Activity  . Alcohol use: No  . Drug use: Not Currently    Comment: Marijuana as a teenager.  Marland Kitchen Sexual activity: Not Currently  Other Topics Concern  . Not on file  Social History Narrative  . Not on file   Social Determinants of Health  Financial Resource Strain: Not on file  Food Insecurity: Not on file  Transportation Needs: Not on file  Physical Activity: Not on file  Stress: Not on file  Social Connections: Not on file     Family History: The patient's family history includes Diabetes in his father; Hypertension in his maternal grandfather; Mental illness in his father.  ROS:   Review of Systems  Constitution: Negative for decreased appetite, fever and weight gain.  HENT: Negative for congestion, ear discharge, hoarse voice and sore throat.   Eyes: Negative for discharge, redness, vision loss in right eye and visual halos.  Cardiovascular: Negative for chest pain, dyspnea on exertion, leg swelling, orthopnea and palpitations.  Respiratory: Negative for cough, hemoptysis, shortness of breath and snoring.    Endocrine: Negative for heat intolerance and polyphagia.  Hematologic/Lymphatic: Negative for bleeding problem. Does not bruise/bleed easily.  Skin: Negative for flushing, nail changes, rash and suspicious lesions.  Musculoskeletal: Negative for arthritis, joint pain, muscle cramps, myalgias, neck pain and stiffness.  Gastrointestinal: Negative for abdominal pain, bowel incontinence, diarrhea and excessive appetite.  Genitourinary: Negative for decreased libido, genital sores and incomplete emptying.  Neurological: Negative for brief paralysis, focal weakness, headaches and loss of balance.  Psychiatric/Behavioral: Negative for altered mental status, depression and suicidal ideas.  Allergic/Immunologic: Negative for HIV exposure and persistent infections.    EKGs/Labs/Other Studies Reviewed:    The following studies were reviewed today:   EKG: None today  Cardiac CTA (08/28/2017): No plaque or stenosis in the coronary arteries. CAC 0. No significant extracardiac findings in the thorax. Exercise tolerance test (05/17/2017): Immediate risk study with 1 mm horizontal ST depression in the inferolateral leads. Rare PVCs during stress. Good exercise capacity. TTE (08/12/2013,[facility]):Normal LV size with mild LVH. LVEF 60% with normal wall motion. Normal RV size and function. Trivial MR.   Recent Labs: 03/18/2020: ALT 25; BUN 10; Creatinine, Ser 1.01; Hemoglobin 13.8; Platelets 318; Potassium 3.3; Sodium 139  Recent Lipid Panel    Component Value Date/Time   CHOL 155 01/15/2020 0957   CHOL 163 05/17/2017 0819   TRIG 86.0 01/15/2020 0957   HDL 49.80 01/15/2020 0957   HDL 59 05/17/2017 0819   CHOLHDL 3 01/15/2020 0957   VLDL 17.2 01/15/2020 0957   LDLCALC 88 01/15/2020 0957   LDLCALC 92 05/17/2017 0819    Physical Exam:    VS:  BP (!) 146/86   Pulse 78   Ht 6\' 1"  (1.854 m)   Wt (!) 303 lb (137.4 kg)   SpO2 98%   BMI 39.98 kg/m     Wt Readings from Last 3  Encounters:  04/20/20 (!) 303 lb (137.4 kg)  04/08/20 (!) 300 lb 8 oz (136.3 kg)  03/18/20 300 lb (136.1 kg)     GEN: Well nourished, well developed in no acute distress HEENT: Normal NECK: No JVD; No carotid bruits LYMPHATICS: No lymphadenopathy CARDIAC: S1S2 noted,RRR, no murmurs, rubs, gallops RESPIRATORY:  Clear to auscultation without rales, wheezing or rhonchi  ABDOMEN: Soft, non-tender, non-distended, +bowel sounds, no guarding. EXTREMITIES: No edema, No cyanosis, no clubbing MUSCULOSKELETAL:  No deformity  SKIN: Warm and dry NEUROLOGIC:  Alert and oriented x 3, non-focal PSYCHIATRIC:  Normal affect, good insight  ASSESSMENT:    1. Essential hypertension   2. Paroxysmal atrial fibrillation (HCC)   3. Primary hypertension   4. OSA (obstructive sleep apnea)   5. Obesity (BMI 30-39.9)    PLAN:     1. He is hypertensive  today in the office will like to do is increase his losartan to 25 mg twice a day. He will remain on amlodipine 10 mg daily as well as carvedilol 12.5 mg twice a day. I educated the patient greatly about decreasing salt intake which will help his blood pressure. He will continue to use his CPAP at home.  2. Paroxysmal atrial fibrillation chads Vascor of 1 no indication for anticoagulation at this time continue patient on his rate control agents.  3. OSA-continue CPAP  4. Obesity-the patient understands the need to lose weight with diet and exercise. We have discussed specific strategies for this.  The patient is in agreement with the above plan. The patient left the office in stable condition.  The patient will follow up in 3 months or sooner if needed.   Medication Adjustments/Labs and Tests Ordered: Current medicines are reviewed at length with the patient today.  Concerns regarding medicines are outlined above.  No orders of the defined types were placed in this encounter.  Meds ordered this encounter  Medications  . DISCONTD: losartan (COZAAR) 25  MG tablet    Sig: Take 1 tablet (25 mg total) by mouth daily.    Dispense:  90 tablet    Refill:  3  . losartan (COZAAR) 25 MG tablet    Sig: Take 1 tablet (25 mg total) by mouth in the morning and at bedtime.    Dispense:  180 tablet    Refill:  3    Patient Instructions  Medication Instructions:  Your physician has recommended you make the following change in your medication: START: Losartan 25 mg twice a day.   *If you need a refill on your cardiac medications before your next appointment, please call your pharmacy*   Lab Work: None If you have labs (blood work) drawn today and your tests are completely normal, you will receive your results only by: Marland Kitchen MyChart Message (if you have MyChart) OR . A paper copy in the mail If you have any lab test that is abnormal or we need to change your treatment, we will call you to review the results.   Testing/Procedures: None   Follow-Up: At [facility], you and your health needs are our priority.  As part of our continuing mission to provide you with exceptional heart care, we have created designated Provider Care Teams.  These Care Teams include your primary Cardiologist (physician) and Advanced Practice Providers (APPs -  Physician Assistants and Nurse Practitioners) who all work together to provide you with the care you need, when you need it.  We recommend signing up for the patient portal called "MyChart".  Sign up information is provided on this After Visit Summary.  MyChart is used to connect with patients for Virtual Visits (Telemedicine).  Patients are able to view lab/test results, encounter notes, upcoming appointments, etc.  Non-urgent messages can be sent to your provider as well.   To learn more about what you can do with MyChart, go to [email or web address].    Your next appointment:   3 month(s)  The format for your next appointment:   In Person  Provider:   Thomasene Ripple, DO   Other Instructions  Start taking  your blood pressure after being on the medication for 2 weeks.     Adopting a Healthy Lifestyle.  Know what a healthy weight is for you (roughly BMI <25) and aim to maintain this   Aim for 7+ servings of fruits and vegetables daily  65-80+ fluid ounces of water or unsweet tea for healthy kidneys   Limit to max 1 drink of alcohol per day; avoid smoking/tobacco   Limit animal fats in diet for cholesterol and heart health - choose grass fed whenever available   Avoid highly processed foods, and foods high in saturated/trans fats   Aim for low stress - take time to unwind and care for your mental health   Aim for 150 min of moderate intensity exercise weekly for heart health, and weights twice weekly for bone health   Aim for 7-9 hours of sleep daily   When it comes to diets, agreement about the perfect plan isnt easy to find, even among the experts. Experts at the [facility] [employer] developed an idea known as the Healthy Eating Plate. Just imagine a plate divided into logical, healthy portions.   The emphasis is on diet quality:   Load up on vegetables and fruits - one-half of your plate: Aim for color and variety, and remember that potatoes dont count.   Go for whole grains - one-quarter of your plate: Whole wheat, barley, wheat berries, quinoa, oats, brown rice, and foods made with them. If you want pasta, go with whole wheat pasta.   Protein power - one-quarter of your plate: Fish, chicken, beans, and nuts are all healthy, versatile protein sources. Limit red meat.   The diet, however, does go beyond the plate, offering a few other suggestions.   Use healthy plant oils, such as olive, canola, soy, corn, sunflower and peanut. Check the labels, and avoid partially hydrogenated oil, which have unhealthy trans fats.   If youre thirsty, drink water. Coffee and tea are good in moderation, but skip sugary drinks and limit milk and dairy products to one or two daily  servings.   The type of carbohydrate in the diet is more important than the amount. Some sources of carbohydrates, such as vegetables, fruits, whole grains, and beans-are healthier than others.   Finally, stay active  Signed, Thomasene Ripple, DO  04/20/2020 4:43 PM    [facility]

## 2020-04-20 NOTE — Patient Instructions (Addendum)
Medication Instructions:  Your physician has recommended you make the following change in your medication: START: Losartan 25 mg twice a day.   *If you need a refill on your cardiac medications before your next appointment, please call your pharmacy*   Lab Work: None If you have labs (blood work) drawn today and your tests are completely normal, you will receive your results only by: Marland Kitchen MyChart Message (if you have MyChart) OR . A paper copy in the mail If you have any lab test that is abnormal or we need to change your treatment, we will call you to review the results.   Testing/Procedures: None   Follow-Up: At [facility] you and your health needs are our priority.  As part of our continuing mission to provide you with exceptional heart care, we have created designated Provider Care Teams.  These Care Teams include your primary Cardiologist (physician) and Advanced Practice Providers (APPs -  Physician Assistants and Nurse Practitioners) who all work together to provide you with the care you need, when you need it.  We recommend signing up for the patient portal called "MyChart".  Sign up information is provided on this After Visit Summary.  MyChart is used to connect with patients for Virtual Visits (Telemedicine).  Patients are able to view lab/test results, encounter notes, upcoming appointments, etc.  Non-urgent messages can be sent to your provider as well.   To learn more about what you can do with MyChart, go to [email or web address].    Your next appointment:   3 month(s)  The format for your next appointment:   In Person  Provider:   Thomasene Ripple, DO   Other Instructions  Start taking your blood pressure after being on the medication for 2 weeks.

## 2020-04-22 ENCOUNTER — Encounter: Payer: Self-pay | Admitting: Medical-Surgical

## 2020-04-26 ENCOUNTER — Ambulatory Visit (INDEPENDENT_AMBULATORY_CARE_PROVIDER_SITE_OTHER): Payer: 59 | Admitting: Medical-Surgical

## 2020-04-26 ENCOUNTER — Other Ambulatory Visit: Payer: Self-pay

## 2020-04-26 ENCOUNTER — Encounter: Payer: Self-pay | Admitting: Medical-Surgical

## 2020-04-26 VITALS — BP 125/70 | HR 68 | Temp 98.7°F | Ht 73.0 in | Wt 303.5 lb

## 2020-04-26 DIAGNOSIS — K648 Other hemorrhoids: Secondary | ICD-10-CM

## 2020-04-26 MED ORDER — HYDROCORTISONE ACETATE 25 MG RE SUPP: 25.0000 mg | Freq: Two times a day (BID) | RECTAL | 0 refills | Status: DC

## 2020-04-26 NOTE — Progress Notes (Signed)
Subjective:    CC: blood in stool  HPI: Pleasant 40 year old male presenting with complaints of blood in his stool.  Reports having 3-4 days of severe constipation.  He has had issues with this in the past and usually increases his dietary fiber and exercise which resolves it.  Notes that he did increase his dietary fiber last week and exercise some and now the constipation is better.  Unfortunately with every bowel movement, he is seeing bright red blood on both of the tissue as well as on the stool in the toilet.  He is having daily bowel movements now but is normal her usually 2-3 daily.  Stool is brown.  He is not taking any NSAIDs or blood thinners.  Denies fever, chills, abdominal pain, nausea, vomiting, diarrhea, shortness of breath, dizziness, fatigue, and weakness.  I reviewed the past medical history, family history, social history, surgical history, and allergies today and no changes were needed.  Please see the problem list section below in epic for further details.  Past Medical History: Past Medical History:  Diagnosis Date  . Atrial fibrillation (HCC)   . Childhood asthma   . Hypertension   . Sleep apnea    Past Surgical History: Past Surgical History:  Procedure Laterality Date  . CHOLECYSTECTOMY N/A 11/08/2016   Procedure: LAPAROSCOPIC CHOLECYSTECTOMY;  Surgeon: Lattie Haw, MD;  Location: [facility] ORS;  Service: General;  Laterality: N/A;  . TONSILLECTOMY     Social History: Social History   Socioeconomic History  . Marital status: Legally Separated    Spouse name: Not on file  . Number of children: Not on file  . Years of education: Not on file  . Highest education level: Not on file  Occupational History  . Not on file  Tobacco Use  . Smoking status: Former Smoker    Packs/day: 0.10    Years: 7.00    Pack years: 0.70    Types: Cigarettes    Quit date: 04/2003    Years since quitting: 17.0  . Smokeless tobacco: Never Used  Vaping Use  . Vaping Use:  Never used  Substance and Sexual Activity  . Alcohol use: No  . Drug use: Not Currently    Comment: Marijuana as a teenager.  Marland Kitchen Sexual activity: Not Currently  Other Topics Concern  . Not on file  Social History Narrative  . Not on file   Social Determinants of Health   Financial Resource Strain: Not on file  Food Insecurity: Not on file  Transportation Needs: Not on file  Physical Activity: Not on file  Stress: Not on file  Social Connections: Not on file   Family History: Family History  Problem Relation Age of Onset  . Diabetes Father   . Mental illness Father   . Hypertension Maternal Grandfather    Allergies: No Known Allergies Medications: See med rec.  Review of Systems: See HPI for pertinent positives and negatives.   Objective:    General: Well Developed, well nourished, and in no acute distress.  Neuro: Alert and oriented x3.  HEENT: Normocephalic, atraumatic.  Skin: Warm and dry. Cardiac: Regular rate and rhythm, no murmurs rubs or gallops, no lower extremity edema.  Respiratory: Clear to auscultation bilaterally. Not using accessory muscles, speaking in full sentences. Abdomen: Soft, nontender, nondistended. Bowel sounds + x 4 quadrants. No HSM appreciated.  Rectal exam deferred at this time.  Impression and Recommendations:    1. Hemorrhoids, internal, with bleeding Anusol suppositories twice daily as  needed.  Recommend using for several days regularly then can use as needed.  Manage constipation with exercise, increase dietary fiber, increase water intake, and stool softeners if needed.  Return if symptoms worsen or fail to improve.  ___________________________________________ Thayer Ohm, DNP, APRN, FNP-BC Primary Care and Sports Medicine [facility]

## 2020-04-30 ENCOUNTER — Other Ambulatory Visit: Payer: Self-pay | Admitting: Medical-Surgical

## 2020-06-14 ENCOUNTER — Other Ambulatory Visit: Payer: Self-pay | Admitting: Medical-Surgical

## 2020-06-14 ENCOUNTER — Ambulatory Visit: Payer: 59 | Admitting: Medical-Surgical

## 2020-06-14 ENCOUNTER — Encounter: Payer: Self-pay | Admitting: Medical-Surgical

## 2020-06-14 ENCOUNTER — Other Ambulatory Visit: Payer: Self-pay

## 2020-06-14 VITALS — BP 159/95 | HR 61 | Temp 98.8°F | Resp 20 | Ht 73.0 in | Wt 303.0 lb

## 2020-06-14 DIAGNOSIS — N485 Ulcer of penis: Secondary | ICD-10-CM | POA: Diagnosis not present

## 2020-06-14 NOTE — Progress Notes (Signed)
Subjective:    CC: Tip of penis sensitive  HPI: Pleasant 40 year old male presenting today for evaluation for a new sensitivity to the tip of his penis.  Notes the presence of a scab in the sensitive area since November 2021 that was in place for approximately 6 months before it fell off at the end of last month.  The scab was not painful and there were no other lesions noted.  Since the scab fell off, he is noted a new sensitivity to the tissue where it was.  He had the sensitivity is only present when he is erect and he does not notice the sensation when he is flaccid.  When erect, and notes the area of the scab was is a bit red/pink.  He has only had 1 male sexual partner recently and they only had oral sex but did not use condoms.  His previous last sexual encounter was in October 2020.  He is circumcised and performs regular hygiene.  Denies fever, chills, rash to palms/soles, penile drainage/discharge, or urinary symptoms.  I reviewed the past medical history, family history, social history, surgical history, and allergies today and no changes were needed.  Please see the problem list section below in epic for further details.  Past Medical History: Past Medical History:  Diagnosis Date  . Atrial fibrillation (HCC)   . Childhood asthma   . Hypertension   . Sleep apnea    Past Surgical History: Past Surgical History:  Procedure Laterality Date  . CHOLECYSTECTOMY N/A 11/08/2016   Procedure: LAPAROSCOPIC CHOLECYSTECTOMY;  Surgeon: Lattie Haw, MD;  Location: [facility] ORS;  Service: General;  Laterality: N/A;  . TONSILLECTOMY     Social History: Social History   Socioeconomic History  . Marital status: Legally Separated    Spouse name: Not on file  . Number of children: Not on file  . Years of education: Not on file  . Highest education level: Not on file  Occupational History  . Not on file  Tobacco Use  . Smoking status: Former Smoker    Packs/day: 0.10    Years: 7.00     Pack years: 0.70    Types: Cigarettes    Quit date: 04/2003    Years since quitting: 17.1  . Smokeless tobacco: Never Used  Vaping Use  . Vaping Use: Never used  Substance and Sexual Activity  . Alcohol use: No  . Drug use: Not Currently    Comment: Marijuana as a teenager.  Marland Kitchen Sexual activity: Not Currently  Other Topics Concern  . Not on file  Social History Narrative  . Not on file   Social Determinants of Health   Financial Resource Strain: Not on file  Food Insecurity: Not on file  Transportation Needs: Not on file  Physical Activity: Not on file  Stress: Not on file  Social Connections: Not on file   Family History: Family History  Problem Relation Age of Onset  . Diabetes Father   . Mental illness Father   . Hypertension Maternal Grandfather    Allergies: No Known Allergies Medications: See med rec.  Review of Systems: See HPI for pertinent positives and negatives.   Objective:    General: Well Developed, well nourished, and in no acute distress.  Neuro: Alert and oriented x3.  HEENT: Normocephalic, atraumatic.  Skin: Warm and dry.  Small area to the head of the penis on the left side that appears to be slightly erythematous/irritated without broken skin, scabbing, or drainage. Cardiac:  Regular rate and rhythm, no murmurs rubs or gallops, no lower extremity edema.  Respiratory: Clear to auscultation bilaterally. Not using accessory muscles, speaking in full sentences.  Impression and Recommendations:    1. Ulcer of penis No ulcer present today the concern for possible syphilis in the setting of painless scab that was present for months before falling off.  Recommend full STI panel including RPR, HIV, hepatitis C, hepatitis B, trichomonas, chlamydia, and gonorrhea today.  If the area becomes painful or develops a new appearance, may benefit from further evaluation. - RPR - HIV Antibody (routine testing w rflx) - Hepatitis C Antibody - Hepatitis B  surface antigen - Trichomonas vaginalis RNA, Ql,Males - C. trachomatis/N. gonorrhoeae RNA  Return if symptoms worsen or fail to improve. ___________________________________________ Thayer Ohm, DNP, APRN, FNP-BC Primary Care and Sports Medicine [facility] [place]

## 2020-06-15 LAB — TRICHOMONAS VAGINALIS RNA, QL,MALES: Trichomonas vaginalis RNA: NOT DETECTED

## 2020-06-15 LAB — HOUSE ACCOUNT TRACKING

## 2020-06-15 LAB — C. TRACHOMATIS/N. GONORRHOEAE RNA
C. trachomatis RNA, TMA: NOT DETECTED
N. gonorrhoeae RNA, TMA: NOT DETECTED

## 2020-06-29 ENCOUNTER — Encounter: Payer: Self-pay | Admitting: Medical-Surgical

## 2020-06-30 NOTE — Telephone Encounter (Signed)
This encounter was created in error - please disregard.

## 2020-07-01 ENCOUNTER — Ambulatory Visit: Payer: 59 | Admitting: Medical-Surgical

## 2020-07-28 ENCOUNTER — Ambulatory Visit: Payer: 59 | Admitting: Cardiology

## 2020-07-28 ENCOUNTER — Encounter: Payer: Self-pay | Admitting: Cardiology

## 2020-07-28 ENCOUNTER — Other Ambulatory Visit: Payer: Self-pay

## 2020-07-28 VITALS — BP 118/84 | HR 72 | Ht 73.0 in | Wt 305.0 lb

## 2020-07-28 DIAGNOSIS — G4733 Obstructive sleep apnea (adult) (pediatric): Secondary | ICD-10-CM

## 2020-07-28 DIAGNOSIS — I10 Essential (primary) hypertension: Secondary | ICD-10-CM | POA: Diagnosis not present

## 2020-07-28 DIAGNOSIS — I480 Paroxysmal atrial fibrillation: Secondary | ICD-10-CM | POA: Diagnosis not present

## 2020-07-28 DIAGNOSIS — E669 Obesity, unspecified: Secondary | ICD-10-CM

## 2020-07-28 MED ORDER — LOSARTAN POTASSIUM 25 MG PO TABS: 25.0000 mg | ORAL_TABLET | Freq: Two times a day (BID) | ORAL | 3 refills | Status: DC

## 2020-07-28 MED ORDER — AMLODIPINE BESYLATE 10 MG PO TABS: ORAL_TABLET | ORAL | 3 refills | Status: DC

## 2020-07-28 MED ORDER — CARVEDILOL 12.5 MG PO TABS: 12.5000 mg | ORAL_TABLET | Freq: Two times a day (BID) | ORAL | 1 refills | Status: DC

## 2020-07-28 NOTE — Patient Instructions (Addendum)
Medication Instructions:  Your physician recommends that you continue on your current medications as directed. Please refer to the Current Medication list given to you today.  *If you need a refill on your cardiac medications before your next appointment, please call your pharmacy*   Lab Work: None If you have labs (blood work) drawn today and your tests are completely normal, you will receive your results only by: Marland Kitchen MyChart Message (if you have MyChart) OR . A paper copy in the mail If you have any lab test that is abnormal or we need to change your treatment, we will call you to review the results.   Testing/Procedures: None   Follow-Up: At [facility], you and your health needs are our priority.  As part of our continuing mission to provide you with exceptional heart care, we have created designated Provider Care Teams.  These Care Teams include your primary Cardiologist (physician) and Advanced Practice Providers (APPs -  Physician Assistants and Nurse Practitioners) who all work together to provide you with the care you need, when you need it.  We recommend signing up for the patient portal called "MyChart".  Sign up information is provided on this After Visit Summary.  MyChart is used to connect with patients for Virtual Visits (Telemedicine).  Patients are able to view lab/test results, encounter notes, upcoming appointments, etc.  Non-urgent messages can be sent to your provider as well.   To learn more about what you can do with MyChart, go to [email or web address].    Your next appointment:   1 year(s)  The format for your next appointment:   In Person  Provider:   Elease Hashimoto - Thomasene Ripple, DO  Other Instructions [address], [place], [place] [address]

## 2020-07-28 NOTE — Addendum Note (Signed)
Addended by: Reynolds Bowl on: 07/28/2020 09:46 AM   Modules accepted: Orders

## 2020-07-28 NOTE — Progress Notes (Signed)
Cardiology Office Note:    Date:  07/28/2020   ID:  Zachary Cruz, DOB 1980/10/28, MRN [id number]  PCP:  Christen Butter, NP  Cardiologist:  Yvonne Kendall, MD  Electrophysiologist:  None   Referring MD: Christen Butter, NP  I am doing fine  History of Present Illness:    Zachary Cruz is a 40 y.o. male with a hx of  paroxysmal atrial fibrillation, hypertension, OSA on APAP, depression is here today for follow-up visit.  The patient however did see my partner Dr. Cristal Deer End.   I saw the patient on April 20, 2020 at that time he was hypotensive and kept the patient on his amlodipine 10 mg daily, carvedilol 12.5 mg twice a day and increased his losartan to 25 mg twice daily.  Here for no complaints today.  Since his last visit he has not had any hospitalizations.  Past Medical History:  Diagnosis Date  . Atrial fibrillation (HCC)   . Childhood asthma   . Hypertension   . Sleep apnea     Past Surgical History:  Procedure Laterality Date  . CHOLECYSTECTOMY N/A 11/08/2016   Procedure: LAPAROSCOPIC CHOLECYSTECTOMY;  Surgeon: Lattie Haw, MD;  Location: [facility] ORS;  Service: General;  Laterality: N/A;  . TONSILLECTOMY      Current Medications: Current Meds  Medication Sig  . amLODipine (NORVASC) 10 MG tablet TAKE 1 TABLET(10 MG) BY MOUTH DAILY.  . carvedilol (COREG) 12.5 MG tablet Take 1 tablet (12.5 mg total) by mouth 2 (two) times daily with a meal.  . cetirizine (ZYRTEC) 10 MG tablet Take 1 tablet (10 mg) by mouth once daily  . losartan (COZAAR) 25 MG tablet Take 1 tablet (25 mg total) by mouth in the morning and at bedtime.  . Multiple Vitamins-Minerals (DAILY MULTIPLE VITAMINS/MIN PO) Take 1 tablet by mouth once daily     Allergies:   Patient has no known allergies.   Social History   Socioeconomic History  . Marital status: Legally Separated    Spouse name: Not on file  . Number of children: Not on file  . Years of education: Not on file  . Highest  education level: Not on file  Occupational History  . Not on file  Tobacco Use  . Smoking status: Former Smoker    Packs/day: 0.10    Years: 7.00    Pack years: 0.70    Types: Cigarettes    Quit date: 04/2003    Years since quitting: 17.3  . Smokeless tobacco: Never Used  Vaping Use  . Vaping Use: Never used  Substance and Sexual Activity  . Alcohol use: No  . Drug use: Not Currently    Comment: Marijuana as a teenager.  Marland Kitchen Sexual activity: Not Currently  Other Topics Concern  . Not on file  Social History Narrative  . Not on file   Social Determinants of Health   Financial Resource Strain: Not on file  Food Insecurity: Not on file  Transportation Needs: Not on file  Physical Activity: Not on file  Stress: Not on file  Social Connections: Not on file     Family History: The patient's family history includes Diabetes in his father; Hypertension in his maternal grandfather; Mental illness in his father.  ROS:   Review of Systems  Constitution: Negative for decreased appetite, fever and weight gain.  HENT: Negative for congestion, ear discharge, hoarse voice and sore throat.   Eyes: Negative for discharge, redness, vision loss in right eye  and visual halos.  Cardiovascular: Negative for chest pain, dyspnea on exertion, leg swelling, orthopnea and palpitations.  Respiratory: Negative for cough, hemoptysis, shortness of breath and snoring.   Endocrine: Negative for heat intolerance and polyphagia.  Hematologic/Lymphatic: Negative for bleeding problem. Does not bruise/bleed easily.  Skin: Negative for flushing, nail changes, rash and suspicious lesions.  Musculoskeletal: Negative for arthritis, joint pain, muscle cramps, myalgias, neck pain and stiffness.  Gastrointestinal: Negative for abdominal pain, bowel incontinence, diarrhea and excessive appetite.  Genitourinary: Negative for decreased libido, genital sores and incomplete emptying.  Neurological: Negative for brief  paralysis, focal weakness, headaches and loss of balance.  Psychiatric/Behavioral: Negative for altered mental status, depression and suicidal ideas.  Allergic/Immunologic: Negative for HIV exposure and persistent infections.    EKGs/Labs/Other Studies Reviewed:    The following studies were reviewed today:   EKG: None today  Cardiac CTA (08/28/2017): No plaque or stenosis in the coronary arteries. CAC 0. No significant extracardiac findings in the thorax.  Exercise tolerance test (05/17/2017): Immediate risk study with 1 mm horizontal ST depression in the inferolateral leads. Rare PVCs during stress. Good exercise capacity. TTE (08/12/2013,[facility]):Normal LV size with mild LVH. LVEF 60% with normal wall motion. Normal RV size and function. Trivial MR.  Recent Labs: 03/18/2020: ALT 25; BUN 10; Creatinine, Ser 1.01; Hemoglobin 13.8; Platelets 318; Potassium 3.3; Sodium 139  Recent Lipid Panel    Component Value Date/Time   CHOL 155 01/15/2020 0957   CHOL 163 05/17/2017 0819   TRIG 86.0 01/15/2020 0957   HDL 49.80 01/15/2020 0957   HDL 59 05/17/2017 0819   CHOLHDL 3 01/15/2020 0957   VLDL 17.2 01/15/2020 0957   LDLCALC 88 01/15/2020 0957   LDLCALC 92 05/17/2017 0819    Physical Exam:    VS:  BP 118/84   Pulse 72   Ht 6\' 1"  (1.854 m)   Wt (!) 305 lb (138.3 kg)   SpO2 98%   BMI 40.24 kg/m     Wt Readings from Last 3 Encounters:  07/28/20 (!) 305 lb (138.3 kg)  06/14/20 (!) 303 lb (137.4 kg)  04/26/20 (!) 303 lb 8 oz (137.7 kg)     GEN: Well nourished, well developed in no acute distress HEENT: Normal NECK: No JVD; No carotid bruits LYMPHATICS: No lymphadenopathy CARDIAC: S1S2 noted,RRR, no murmurs, rubs, gallops RESPIRATORY:  Clear to auscultation without rales, wheezing or rhonchi  ABDOMEN: Soft, non-tender, non-distended, +bowel sounds, no guarding. EXTREMITIES: No edema, No cyanosis, no clubbing MUSCULOSKELETAL:  No deformity  SKIN: Warm  and dry NEUROLOGIC:  Alert and oriented x 3, non-focal PSYCHIATRIC:  Normal affect, good insight  ASSESSMENT:    1. Paroxysmal atrial fibrillation (HCC)   2. Hypertension, unspecified type   3. OSA (obstructive sleep apnea)   4. Obesity (BMI 30-39.9)    PLAN:     His blood pressure is acceptable this was manually taken by me.  He will remain on his losartan 25 mg twice a day, carvedilol 12.5 mg twice a day and amlodipine 10 mg daily.  No atrial fibrillation episode we will continue patient on his current rate control agents.  CHA2DS2-VASc score is 1 for hypertension and no indication for anticoagulation at this time.  OSA, continue CPAP.  The patient understands the need to lose weight with diet and exercise. We have discussed specific strategies for this.  The patient is in agreement with the above plan. The patient left the office in stable condition.  The patient  will follow up in 1 year or sooner if needed.   Medication Adjustments/Labs and Tests Ordered: Current medicines are reviewed at length with the patient today.  Concerns regarding medicines are outlined above.  No orders of the defined types were placed in this encounter.  No orders of the defined types were placed in this encounter.   There are no Patient Instructions on file for this visit.   Adopting a Healthy Lifestyle.  Know what a healthy weight is for you (roughly BMI <25) and aim to maintain this   Aim for 7+ servings of fruits and vegetables daily   65-80+ fluid ounces of water or unsweet tea for healthy kidneys   Limit to max 1 drink of alcohol per day; avoid smoking/tobacco   Limit animal fats in diet for cholesterol and heart health - choose grass fed whenever available   Avoid highly processed foods, and foods high in saturated/trans fats   Aim for low stress - take time to unwind and care for your mental health   Aim for 150 min of moderate intensity exercise weekly for heart health, and  weights twice weekly for bone health   Aim for 7-9 hours of sleep daily   When it comes to diets, agreement about the perfect plan isnt easy to find, even among the experts. Experts at the [facility] [employer] developed an idea known as the Healthy Eating Plate. Just imagine a plate divided into logical, healthy portions.   The emphasis is on diet quality:   Load up on vegetables and fruits - one-half of your plate: Aim for color and variety, and remember that potatoes dont count.   Go for whole grains - one-quarter of your plate: Whole wheat, barley, wheat berries, quinoa, oats, brown rice, and foods made with them. If you want pasta, go with whole wheat pasta.   Protein power - one-quarter of your plate: Fish, chicken, beans, and nuts are all healthy, versatile protein sources. Limit red meat.   The diet, however, does go beyond the plate, offering a few other suggestions.   Use healthy plant oils, such as olive, canola, soy, corn, sunflower and peanut. Check the labels, and avoid partially hydrogenated oil, which have unhealthy trans fats.   If youre thirsty, drink water. Coffee and tea are good in moderation, but skip sugary drinks and limit milk and dairy products to one or two daily servings.   The type of carbohydrate in the diet is more important than the amount. Some sources of carbohydrates, such as vegetables, fruits, whole grains, and beans-are healthier than others.   Finally, stay active  Signed, Thomasene Ripple, DO  07/28/2020 9:33 AM    [facility]

## 2020-09-05 ENCOUNTER — Other Ambulatory Visit: Payer: Self-pay | Admitting: Internal Medicine

## 2020-09-05 NOTE — Telephone Encounter (Signed)
No longer under [facility]

## 2020-09-17 ENCOUNTER — Encounter: Payer: Self-pay | Admitting: Medical-Surgical

## 2020-09-21 ENCOUNTER — Other Ambulatory Visit: Payer: Self-pay

## 2020-09-21 ENCOUNTER — Encounter: Payer: Self-pay | Admitting: Medical-Surgical

## 2020-09-21 ENCOUNTER — Ambulatory Visit: Payer: 59 | Admitting: Medical-Surgical

## 2020-09-21 VITALS — BP 142/84 | HR 67 | Temp 99.1°F | Ht 73.0 in | Wt 308.0 lb

## 2020-09-21 DIAGNOSIS — R0981 Nasal congestion: Secondary | ICD-10-CM

## 2020-09-21 DIAGNOSIS — L989 Disorder of the skin and subcutaneous tissue, unspecified: Secondary | ICD-10-CM

## 2020-09-21 NOTE — Progress Notes (Signed)
Subjective:    CC: Dark spot on skin  HPI: Pleasant 40 year old male presenting today for evaluation of a dark spot on the scalp.  He is unsure how long the spot has been there.  He does shave his head on a weekly basis and noted the last time he shaved his head but there was an elevated spot there.  He has been picking at it a little.  No pain, swelling, bleeding, discharge, itching, or irritation.  He does not use sunscreen when outside.   Reported approximately 2 months of nasal congestion without any other symptoms to MA.  Unfortunately, this note was missed during his appointment.  MyChart message and voicemail to patient to gather more information.  I reviewed the past medical history, family history, social history, surgical history, and allergies today and no changes were needed.  Please see the problem list section below in epic for further details.  Past Medical History: Past Medical History:  Diagnosis Date   Atrial fibrillation (HCC)    Childhood asthma    Hypertension    Sleep apnea    Past Surgical History: Past Surgical History:  Procedure Laterality Date   CHOLECYSTECTOMY N/A 11/08/2016   Procedure: LAPAROSCOPIC CHOLECYSTECTOMY;  Surgeon: Lattie Haw, MD;  Location: [facility] ORS;  Service: General;  Laterality: N/A;   TONSILLECTOMY     Social History: Social History   Socioeconomic History   Marital status: Legally Separated    Spouse name: Not on file   Number of children: Not on file   Years of education: Not on file   Highest education level: Not on file  Occupational History   Not on file  Tobacco Use   Smoking status: Former    Packs/day: 0.10    Years: 7.00    Pack years: 0.70    Types: Cigarettes    Quit date: 04/2003    Years since quitting: 17.4   Smokeless tobacco: Never  Vaping Use   Vaping Use: Never used  Substance and Sexual Activity   Alcohol use: No   Drug use: Not Currently    Comment: Marijuana as a teenager.   Sexual activity:  Not Currently  Other Topics Concern   Not on file  Social History Narrative   Not on file   Social Determinants of Health   Financial Resource Strain: Not on file  Food Insecurity: Not on file  Transportation Needs: Not on file  Physical Activity: Not on file  Stress: Not on file  Social Connections: Not on file   Family History: Family History  Problem Relation Age of Onset   Diabetes Father    Mental illness Father    Hypertension Maternal Grandfather    Allergies: No Known Allergies Medications: See med rec.  Review of Systems: See HPI for pertinent positives and negatives.   Objective:    General: Well Developed, well nourished, and in no acute distress.  Neuro: Alert and oriented x3, extra-ocular muscles intact, sensation grossly intact.  HEENT: Normocephalic, atraumatic, pupils equal round reactive to light, neck supple, no masses, no lymphadenopathy, thyroid nonpalpable.  Skin: Warm and dry, no rashes. Cardiac: Regular rate and rhythm, no murmurs rubs or gallops, no lower extremity edema.  Respiratory: Clear to auscultation bilaterally. Not using accessory muscles, speaking in full sentences.   Impression and Recommendations:    1. Scalp lesion Lesion resembling a seborrheic keratosis versus actinic keratosis versus wartlike growth.  Discussed options for treatment including cryotherapy as well as biopsy and/or referral  to dermatology.  Hesitant to do biopsy due to placement in hairline.  After discussion, patient would like to go forward with cryotherapy and if the lesion returns or does not resolve, would like to see dermatology for potential biopsy.  Cryotherapy of the single right frontal scalp lesion completed with 3 passes, patient tolerated well.  Reviewed aftercare and symptoms to monitor for.  Referral entered for dermatology as her wait times can be a bit prolonged. - Ambulatory referral to Dermatology  2. Nasal congestion Attempted to call patient, no  answer so voicemail left.  MyChart message sent to gather more information regarding nasal congestion.  Return if symptoms worsen or fail to improve. ___________________________________________ Thayer Ohm, DNP, APRN, FNP-BC Primary Care and Sports Medicine [facility] [place]

## 2021-01-17 ENCOUNTER — Encounter: Payer: 59 | Admitting: Internal Medicine

## 2021-02-25 ENCOUNTER — Ambulatory Visit: Payer: 59 | Admitting: Medical-Surgical

## 2021-04-20 ENCOUNTER — Telehealth: Payer: Self-pay | Admitting: Cardiology

## 2021-04-20 MED ORDER — CARVEDILOL 12.5 MG PO TABS: 12.5000 mg | ORAL_TABLET | Freq: Two times a day (BID) | ORAL | 0 refills | Status: DC

## 2021-04-20 NOTE — Telephone Encounter (Signed)
Medication has been refilled.

## 2021-04-20 NOTE — Telephone Encounter (Signed)
°*  STAT* If patient is at the pharmacy, call can be transferred to refill team.   1. Which medications need to be refilled? (please list name of each medication and dose if known) carvedilol (COREG) 12.5 MG tablet  2. Which pharmacy/location (including street and city if local pharmacy) is medication to be sent to? [id number] - [place], [place] - [address]  3. Do they need a 30 day or 90 day supply? 90 day

## 2021-06-06 ENCOUNTER — Encounter: Payer: Self-pay | Admitting: Cardiology

## 2021-07-18 ENCOUNTER — Other Ambulatory Visit: Payer: Self-pay | Admitting: Cardiovascular Disease

## 2021-07-28 ENCOUNTER — Encounter: Payer: Self-pay | Admitting: Cardiology

## 2021-07-28 ENCOUNTER — Encounter: Payer: Self-pay | Admitting: Medical-Surgical

## 2021-07-28 ENCOUNTER — Ambulatory Visit (INDEPENDENT_AMBULATORY_CARE_PROVIDER_SITE_OTHER): Payer: 59 | Admitting: Medical-Surgical

## 2021-07-28 ENCOUNTER — Ambulatory Visit: Payer: 59 | Admitting: Cardiology

## 2021-07-28 VITALS — BP 130/82 | HR 60 | Ht 72.0 in | Wt 316.8 lb

## 2021-07-28 VITALS — BP 132/79 | HR 75 | Resp 20 | Ht 72.0 in | Wt 316.8 lb

## 2021-07-28 DIAGNOSIS — G4733 Obstructive sleep apnea (adult) (pediatric): Secondary | ICD-10-CM | POA: Diagnosis not present

## 2021-07-28 DIAGNOSIS — H6123 Impacted cerumen, bilateral: Secondary | ICD-10-CM | POA: Insufficient documentation

## 2021-07-28 DIAGNOSIS — F320 Major depressive disorder, single episode, mild: Secondary | ICD-10-CM | POA: Diagnosis not present

## 2021-07-28 DIAGNOSIS — Z0000 Encounter for general adult medical examination without abnormal findings: Secondary | ICD-10-CM | POA: Diagnosis not present

## 2021-07-28 DIAGNOSIS — I10 Essential (primary) hypertension: Secondary | ICD-10-CM | POA: Diagnosis not present

## 2021-07-28 DIAGNOSIS — Z131 Encounter for screening for diabetes mellitus: Secondary | ICD-10-CM

## 2021-07-28 DIAGNOSIS — I480 Paroxysmal atrial fibrillation: Secondary | ICD-10-CM | POA: Diagnosis not present

## 2021-07-28 DIAGNOSIS — Z1329 Encounter for screening for other suspected endocrine disorder: Secondary | ICD-10-CM

## 2021-07-28 NOTE — Assessment & Plan Note (Addendum)
Checking labs today.  Up-to-date on most preventative care although would recommend updating eye care soon.  Wellness information provided with AVS. forward labs to cardiology once results available.

## 2021-07-28 NOTE — Patient Instructions (Addendum)
Medication Instructions:  Your physician recommends that you continue on your current medications as directed. Please refer to the Current Medication list given to you today.  *If you need a refill on your cardiac medications before your next appointment, please call your pharmacy*   Lab Work: None If you have labs (blood work) drawn today and your tests are completely normal, you will receive your results only by: MyChart Message (if you have MyChart) OR A paper copy in the mail If you have any lab test that is abnormal or we need to change your treatment, we will call you to review the results.   Testing/Procedures: None   Follow-Up: At [facility], you and your health needs are our priority.  As part of our continuing mission to provide you with exceptional heart care, we have created designated Provider Care Teams.  These Care Teams include your primary Cardiologist (physician) and Advanced Practice Providers (APPs -  Physician Assistants and Nurse Practitioners) who all work together to provide you with the care you need, when you need it.  We recommend signing up for the patient portal called "MyChart".  Sign up information is provided on this After Visit Summary.  MyChart is used to connect with patients for Virtual Visits (Telemedicine).  Patients are able to view lab/test results, encounter notes, upcoming appointments, etc.  Non-urgent messages can be sent to your provider as well.   To learn more about what you can do with MyChart, go to [email or web address].    Your next appointment:   1 year(s)  The format for your next appointment:   In Person  Provider:   Thomasene Ripple, DO    Other Instructions Please have your PCP do a Lipid panel or HbgA1c  Important Information About Sugar

## 2021-07-28 NOTE — Progress Notes (Signed)
Complete physical exam  Patient: Zachary Cruz   DOB: December 31, 1980   40 y.o. Male  MRN: [id number]  Subjective:    Chief Complaint  Patient presents with   Annual Exam    Dietrick Kylil Swopes is a 41 y.o. male who presents today for a complete physical exam. He reports consuming a general diet. The patient does not participate in regular exercise at present. He generally feels well. He reports sleeping well. He does not have additional problems to discuss today.  Reports some issues with constipation approximately 6 weeks ago however this has since resolved.   Most recent fall risk assessment:    07/28/2021    2:19 PM  Fall Risk   Falls in the past year? 0  Number falls in past yr: 0  Injury with Fall? 0  Risk for fall due to : No Fall Risks  Follow up Falls evaluation completed     Most recent depression screenings:    07/28/2021    2:19 PM 04/08/2020   11:04 AM  PHQ 2/9 Scores  PHQ - 2 Score 0 1  PHQ- 9 Score  8    Vision:Within last year and Dental: No current dental problems and Receives regular dental care    Patient Care Team: Christen Butter, NP as PCP - General (Nurse Practitioner) End, Cristal Deer, MD as PCP - Cardiology (Cardiology)   Outpatient Medications Prior to Visit  Medication Sig   amLODipine (NORVASC) 10 MG tablet TAKE 1 TABLET(10 MG) BY MOUTH DAILY.   carvedilol (COREG) 12.5 MG tablet Take 1 tablet (12.5 mg total) by mouth 2 (two) times daily with a meal. Patient must keep scheduled appointment for future refills.   cetirizine (ZYRTEC) 10 MG tablet Take 1 tablet (10 mg) by mouth once daily   losartan (COZAAR) 25 MG tablet Take 1 tablet (25 mg total) by mouth in the morning and at bedtime.   Multiple Vitamins-Minerals (DAILY MULTIPLE VITAMINS/MIN PO) Take 1 tablet by mouth once daily   No facility-administered medications prior to visit.    Review of Systems  Constitutional:  Negative for chills, fever, malaise/fatigue and weight loss.  HENT:   Negative for congestion, ear pain, hearing loss, sinus pain and sore throat.   Eyes:  Negative for blurred vision, photophobia and pain.  Respiratory:  Negative for cough, shortness of breath and wheezing.   Cardiovascular:  Negative for chest pain, palpitations and leg swelling.  Gastrointestinal:  Negative for abdominal pain, constipation, diarrhea, heartburn, nausea and vomiting.  Genitourinary:  Negative for dysuria, frequency and urgency.  Musculoskeletal:  Negative for falls and neck pain.  Skin:  Negative for itching and rash.  Neurological:  Negative for dizziness, weakness and headaches.  Endo/Heme/Allergies:  Negative for polydipsia. Does not bruise/bleed easily.  Psychiatric/Behavioral:  Negative for depression, substance abuse and suicidal ideas. The patient is not nervous/anxious and does not have insomnia.      Objective:     BP 132/79 (BP Location: Right Arm, Cuff Size: Large)   Pulse 75   Resp 20   Ht 6' (1.829 m)   Wt (!) 316 lb 12.8 oz (143.7 kg)   SpO2 96%   BMI 42.97 kg/m    Physical Exam Constitutional:      General: He is not in acute distress.    Appearance: Normal appearance. He is obese. He is not ill-appearing.  HENT:     Head: Normocephalic and atraumatic.     Right Ear: External ear normal. There  is impacted cerumen.     Left Ear: External ear normal. There is impacted cerumen.     Nose: Nose normal.     Mouth/Throat:     Mouth: Mucous membranes are moist.     Pharynx: No oropharyngeal exudate or posterior oropharyngeal erythema.  Eyes:     Extraocular Movements: Extraocular movements intact.     Conjunctiva/sclera: Conjunctivae normal.     Pupils: Pupils are equal, round, and reactive to light.  Neck:     Thyroid: No thyromegaly.     Vascular: No carotid bruit or JVD.     Trachea: Trachea normal.  Cardiovascular:     Rate and Rhythm: Normal rate and regular rhythm.     Pulses: Normal pulses.     Heart sounds: Normal heart sounds. No murmur  heard.   No friction rub. No gallop.  Pulmonary:     Effort: Pulmonary effort is normal. No respiratory distress.     Breath sounds: Normal breath sounds. No wheezing.  Abdominal:     General: Bowel sounds are normal. There is no distension.     Palpations: Abdomen is soft.     Tenderness: There is no abdominal tenderness. There is no guarding.  Musculoskeletal:        General: Normal range of motion.     Cervical back: Normal range of motion and neck supple.  Skin:    General: Skin is warm and dry.  Neurological:     Mental Status: He is alert and oriented to person, place, and time.     Cranial Nerves: No cranial nerve deficit.  Psychiatric:        Mood and Affect: Mood normal.        Behavior: Behavior normal.        Thought Content: Thought content normal.        Judgment: Judgment normal.     No results found for any visits on 07/28/21.     Assessment & Plan:    Routine Health Maintenance and Physical Exam  Immunization History  Administered Date(s) Administered   PFIZER(Purple Top)SARS-COV-2 Vaccination 08/05/2019, 08/26/2019, 06/14/2020   Tdap 05/12/2017    Health Maintenance  Topic Date Due   INFLUENZA VACCINE  10/11/2021   TETANUS/TDAP  05/13/2027   HPV VACCINES  Aged Out   COVID-19 Vaccine  Discontinued   Hepatitis C Screening  Discontinued   HIV Screening  Discontinued   Discussed health benefits of physical activity, and encouraged him to engage in regular exercise appropriate for his age and condition.  Problem List Items Addressed This Visit       Respiratory   OSA (obstructive sleep apnea)     Nervous and Auditory   Bilateral impacted cerumen    Ceruminosis is noted.  Wax is removed by syringing and manual debridement. Instructions for home care to prevent wax buildup are given.          Other   Depression, major, single episode, mild (HCC)   Annual physical exam - Primary    Checking labs today.  Up-to-date on most preventative care  although would recommend updating eye care soon.  Wellness information provided with AVS. forward labs to cardiology once results available.       Relevant Orders   Lipid panel   COMPLETE METABOLIC PANEL WITH GFR   CBC with Differential/Platelet   Other Visit Diagnoses     Diabetes mellitus screening       Relevant Orders   Hemoglobin A1c  Thyroid disorder screen       Relevant Orders   TSH      Return in about 1 year (around 07/29/2022) for annual physical exam (or sooner if needed).   ___________________________________________ Thayer Ohm, DNP, APRN, FNP-BC Primary Care and Sports Medicine [facility] [place]

## 2021-07-28 NOTE — Assessment & Plan Note (Signed)
Ceruminosis is noted.  Wax is removed by syringing and manual debridement. Instructions for home care to prevent wax buildup are given.  

## 2021-07-28 NOTE — Progress Notes (Signed)
Cardiology Office Note:    Date:  07/28/2021   ID:  Zachary Cruz, DOB December 14, 1980, MRN [id number]  PCP:  Christen ButterJessup, Joy, NP  Cardiologist:  Yvonne Kendallhristopher End, MD  Electrophysiologist:  None   Referring MD: Christen ButterJessup, Joy, NP   "I am doing fine"  History of Present Illness:    Zachary Cruz is a 41 y.o. male with a hx of  paroxysmal atrial fibrillation, hypertension, OSA on APAP, depression is here today for follow-up visit.  His last visit on 07/28/2020 he was he was doing well no changes were made to his medication.  Today he tells few weeks ago he had an episode of lightheaded and dizziness with leg swelling.  But since starting his exercise  Past Medical History:  Diagnosis Date   Atrial fibrillation (HCC)    Childhood asthma    Hypertension    Sleep apnea     Past Surgical History:  Procedure Laterality Date   CHOLECYSTECTOMY N/A 11/08/2016   Procedure: LAPAROSCOPIC CHOLECYSTECTOMY;  Surgeon: Lattie Hawooper, Richard E, MD;  Location: [facility] ORS;  Service: General;  Laterality: N/A;   TONSILLECTOMY      Current Medications: Current Meds  Medication Sig   amLODipine (NORVASC) 10 MG tablet TAKE 1 TABLET(10 MG) BY MOUTH DAILY.   carvedilol (COREG) 12.5 MG tablet Take 1 tablet (12.5 mg total) by mouth 2 (two) times daily with a meal. Patient must keep scheduled appointment for future refills.   cetirizine (ZYRTEC) 10 MG tablet Take 1 tablet (10 mg) by mouth once daily   losartan (COZAAR) 25 MG tablet Take 1 tablet (25 mg total) by mouth in the morning and at bedtime.   Multiple Vitamins-Minerals (DAILY MULTIPLE VITAMINS/MIN PO) Take 1 tablet by mouth once daily     Allergies:   Patient has no known allergies.   Social History   Socioeconomic History   Marital status: Legally Separated    Spouse name: Not on file   Number of children: Not on file   Years of education: Not on file   Highest education level: Not on file  Occupational History   Not on file  Tobacco Use    Smoking status: Former    Packs/day: 0.10    Years: 7.00    Pack years: 0.70    Types: Cigarettes    Quit date: 04/2003    Years since quitting: 18.3   Smokeless tobacco: Never  Vaping Use   Vaping Use: Never used  Substance and Sexual Activity   Alcohol use: No   Drug use: Not Currently    Comment: Marijuana as a teenager.   Sexual activity: Not Currently  Other Topics Concern   Not on file  Social History Narrative   Not on file   Social Determinants of Health   Financial Resource Strain: Not on file  Food Insecurity: Not on file  Transportation Needs: Not on file  Physical Activity: Not on file  Stress: Not on file  Social Connections: Not on file     Family History: The patient's family history includes Diabetes in his father; Hypertension in his maternal grandfather; Mental illness in his father.  ROS:   Review of Systems  Constitution: Negative for decreased appetite, fever and weight gain.  HENT: Negative for congestion, ear discharge, hoarse voice and sore throat.   Eyes: Negative for discharge, redness, vision loss in right eye and visual halos.  Cardiovascular: Negative for chest pain, dyspnea on exertion, leg swelling, orthopnea and palpitations.  Respiratory:  Negative for cough, hemoptysis, shortness of breath and snoring.   Endocrine: Negative for heat intolerance and polyphagia.  Hematologic/Lymphatic: Negative for bleeding problem. Does not bruise/bleed easily.  Skin: Negative for flushing, nail changes, rash and suspicious lesions.  Musculoskeletal: Negative for arthritis, joint pain, muscle cramps, myalgias, neck pain and stiffness.  Gastrointestinal: Negative for abdominal pain, bowel incontinence, diarrhea and excessive appetite.  Genitourinary: Negative for decreased libido, genital sores and incomplete emptying.  Neurological: Negative for brief paralysis, focal weakness, headaches and loss of balance.  Psychiatric/Behavioral: Negative for altered  mental status, depression and suicidal ideas.  Allergic/Immunologic: Negative for HIV exposure and persistent infections.    EKGs/Labs/Other Studies Reviewed:    The following studies were reviewed today:   EKG:  The ekg ordered today demonstrates sinus rhythm, heart rate 60 bpm.  Cardiac CTA (08/28/2017): No plaque or stenosis in the coronary arteries.  CAC 0.  No significant extracardiac findings in the thorax.   Exercise tolerance test (05/17/2017): Immediate risk study with 1 mm horizontal ST depression in the inferolateral leads.  Rare PVCs during stress.  Good exercise capacity. TTE (08/12/2013, [facility]): Normal LV size with mild LVH.  LVEF 60% with normal wall motion.  Normal RV size and function.  Trivial MR.   Recent Labs: No results found for requested labs within last 8760 hours.  Recent Lipid Panel    Component Value Date/Time   CHOL 155 01/15/2020 0957   CHOL 163 05/17/2017 0819   TRIG 86.0 01/15/2020 0957   HDL 49.80 01/15/2020 0957   HDL 59 05/17/2017 0819   CHOLHDL 3 01/15/2020 0957   VLDL 17.2 01/15/2020 0957   LDLCALC 88 01/15/2020 0957   LDLCALC 92 05/17/2017 0819    Physical Exam:    VS:  BP 130/82   Pulse 60   Ht 6' (1.829 m)   Wt (!) 316 lb 12.8 oz (143.7 kg)   SpO2 96%   BMI 42.97 kg/m     Wt Readings from Last 3 Encounters:  07/28/21 (!) 316 lb 12.8 oz (143.7 kg)  09/21/20 (!) 308 lb (139.7 kg)  07/28/20 (!) 305 lb (138.3 kg)     GEN: Well nourished, well developed in no acute distress HEENT: Normal NECK: No JVD; No carotid bruits LYMPHATICS: No lymphadenopathy CARDIAC: S1S2 noted,RRR, no murmurs, rubs, gallops RESPIRATORY:  Clear to auscultation without rales, wheezing or rhonchi  ABDOMEN: Soft, non-tender, non-distended, +bowel sounds, no guarding. EXTREMITIES: No edema, No cyanosis, no clubbing MUSCULOSKELETAL:  No deformity  SKIN: Warm and dry NEUROLOGIC:  Alert and oriented x 3, non-focal PSYCHIATRIC:  Normal  affect, good insight  ASSESSMENT:    1. Paroxysmal atrial fibrillation (HCC)   2. Primary hypertension   3. OSA (obstructive sleep apnea)   4. Morbid obesity (HCC)    PLAN:    He is in sinus rhythm today.  We will continue the patient on his carvedilol for the paroxysmal A-fib.  Anticoagulation is not indicated at this time.  Continue to use his CPAP nightly.  Blood pressure is acceptable, continue with current antihypertensive regimen.  The patient understands the need to lose weight with diet and exercise. We have discussed specific strategies for this.  He has had a coronary CTA in 2019 no evidence of coronary artery disease.  The patient is in agreement with the above plan. The patient left the office in stable condition.  The patient will follow up in 1 year or sooner if needed.   Medication Adjustments/Labs  and Tests Ordered: Current medicines are reviewed at length with the patient today.  Concerns regarding medicines are outlined above.  Orders Placed This Encounter  Procedures   EKG 12-Lead   No orders of the defined types were placed in this encounter.   Patient Instructions  Medication Instructions:  Your physician recommends that you continue on your current medications as directed. Please refer to the Current Medication list given to you today.  *If you need a refill on your cardiac medications before your next appointment, please call your pharmacy*   Lab Work: None If you have labs (blood work) drawn today and your tests are completely normal, you will receive your results only by: MyChart Message (if you have MyChart) OR A paper copy in the mail If you have any lab test that is abnormal or we need to change your treatment, we will call you to review the results.   Testing/Procedures: None   Follow-Up: At [facility], you and your health needs are our priority.  As part of our continuing mission to provide you with exceptional heart care, we have  created designated Provider Care Teams.  These Care Teams include your primary Cardiologist (physician) and Advanced Practice Providers (APPs -  Physician Assistants and Nurse Practitioners) who all work together to provide you with the care you need, when you need it.  We recommend signing up for the patient portal called "MyChart".  Sign up information is provided on this After Visit Summary.  MyChart is used to connect with patients for Virtual Visits (Telemedicine).  Patients are able to view lab/test results, encounter notes, upcoming appointments, etc.  Non-urgent messages can be sent to your provider as well.   To learn more about what you can do with MyChart, go to [email or web address].    Your next appointment:   1 year(s)  The format for your next appointment:   In Person  Provider:   Thomasene Ripple, DO    Other Instructions Please have your PCP do a Lipid panel or HbgA1c  Important Information About Sugar         Adopting a Healthy Lifestyle.  Know what a healthy weight is for you (roughly BMI <25) and aim to maintain this   Aim for 7+ servings of fruits and vegetables daily   65-80+ fluid ounces of water or unsweet tea for healthy kidneys   Limit to max 1 drink of alcohol per day; avoid smoking/tobacco   Limit animal fats in diet for cholesterol and heart health - choose grass fed whenever available   Avoid highly processed foods, and foods high in saturated/trans fats   Aim for low stress - take time to unwind and care for your mental health   Aim for 150 min of moderate intensity exercise weekly for heart health, and weights twice weekly for bone health   Aim for 7-9 hours of sleep daily   When it comes to diets, agreement about the perfect plan isnt easy to find, even among the experts. Experts at the [facility] of [employer] developed an idea known as the Healthy Eating Plate. Just imagine a plate divided into logical, healthy portions.   The  emphasis is on diet quality:   Load up on vegetables and fruits - one-half of your plate: Aim for color and variety, and remember that potatoes dont count.   Go for whole grains - one-quarter of your plate: Whole wheat, barley, wheat berries, quinoa, oats, brown rice, and foods made with  them. If you want pasta, go with whole wheat pasta.   Protein power - one-quarter of your plate: Fish, chicken, beans, and nuts are all healthy, versatile protein sources. Limit red meat.   The diet, however, does go beyond the plate, offering a few other suggestions.   Use healthy plant oils, such as olive, canola, soy, corn, sunflower and peanut. Check the labels, and avoid partially hydrogenated oil, which have unhealthy trans fats.   If youre thirsty, drink water. Coffee and tea are good in moderation, but skip sugary drinks and limit milk and dairy products to one or two daily servings.   The type of carbohydrate in the diet is more important than the amount. Some sources of carbohydrates, such as vegetables, fruits, whole grains, and beans-are healthier than others.   Finally, stay active  Signed, Thomasene Ripple, DO  07/28/2021 9:46 AM    [facility]

## 2021-07-30 LAB — CBC WITH DIFFERENTIAL/PLATELET
Absolute Monocytes: 384 cells/uL (ref 200–950)
Basophils Absolute: 18 cells/uL (ref 0–200)
Basophils Relative: 0.3 %
Eosinophils Absolute: 401 cells/uL (ref 15–500)
Eosinophils Relative: 6.8 %
HCT: 41.8 % (ref 38.5–50.0)
Hemoglobin: 13.5 g/dL (ref 13.2–17.1)
Lymphs Abs: 1994 cells/uL (ref 850–3900)
MCH: 26.6 pg — ABNORMAL LOW (ref 27.0–33.0)
MCHC: 32.3 g/dL (ref 32.0–36.0)
MCV: 82.3 fL (ref 80.0–100.0)
MPV: 10.2 fL (ref 7.5–12.5)
Monocytes Relative: 6.5 %
Neutro Abs: 3103 cells/uL (ref 1500–7800)
Neutrophils Relative %: 52.6 %
Platelets: 341 10*3/uL (ref 140–400)
RBC: 5.08 10*6/uL (ref 4.20–5.80)
RDW: 13.1 % (ref 11.0–15.0)
Total Lymphocyte: 33.8 %
WBC: 5.9 10*3/uL (ref 3.8–10.8)

## 2021-07-30 LAB — COMPLETE METABOLIC PANEL WITH GFR
AG Ratio: 1.4 (calc) (ref 1.0–2.5)
ALT: 28 U/L (ref 9–46)
AST: 42 U/L — ABNORMAL HIGH (ref 10–40)
Albumin: 4.2 g/dL (ref 3.6–5.1)
Alkaline phosphatase (APISO): 83 U/L (ref 36–130)
BUN: 8 mg/dL (ref 7–25)
CO2: 25 mmol/L (ref 20–32)
Calcium: 8.8 mg/dL (ref 8.6–10.3)
Chloride: 107 mmol/L (ref 98–110)
Creat: 0.9 mg/dL (ref 0.60–1.29)
Globulin: 3.1 g/dL (calc) (ref 1.9–3.7)
Glucose, Bld: 81 mg/dL (ref 65–99)
Potassium: 3.7 mmol/L (ref 3.5–5.3)
Sodium: 144 mmol/L (ref 135–146)
Total Bilirubin: 0.4 mg/dL (ref 0.2–1.2)
Total Protein: 7.3 g/dL (ref 6.1–8.1)
eGFR: 111 mL/min/{1.73_m2} (ref >=60)

## 2021-07-30 LAB — HEMOGLOBIN A1C
Hgb A1c MFr Bld: 5.9 % of total Hgb — ABNORMAL HIGH (ref <5.7)
Mean Plasma Glucose: 123 mg/dL
eAG (mmol/L): 6.8 mmol/L

## 2021-07-30 LAB — LIPID PANEL
Cholesterol: 176 mg/dL (ref <200)
HDL: 56 mg/dL (ref >=40)
LDL Cholesterol (Calc): 105 mg/dL (calc) — ABNORMAL HIGH
Non-HDL Cholesterol (Calc): 120 mg/dL (calc) (ref <130)
Total CHOL/HDL Ratio: 3.1 (calc) (ref <5.0)
Triglycerides: 60 mg/dL (ref <150)

## 2021-07-30 LAB — TSH: TSH: 1.06 mIU/L (ref 0.40–4.50)

## 2021-07-31 ENCOUNTER — Encounter: Payer: Self-pay | Admitting: Medical-Surgical

## 2021-07-31 DIAGNOSIS — R7303 Prediabetes: Secondary | ICD-10-CM | POA: Insufficient documentation

## 2021-08-16 ENCOUNTER — Telehealth: Payer: Self-pay | Admitting: Cardiology

## 2021-08-16 NOTE — Telephone Encounter (Signed)
*  STAT* If patient is at the pharmacy, call can be transferred to refill team.   1. Which medications need to be refilled? (please list name of each medication and dose if known)  carvedilol (COREG) 12.5 MG tablet  2. Which pharmacy/location (including street and city if local pharmacy) is medication to be sent to? [id number] - , [place] - [address]  3. Do they need a 30 day or 90 day supply? 90 with refills  Patient is out of medication   Patient saw Dr. Servando Salina 07/28/21

## 2021-08-17 MED ORDER — CARVEDILOL 12.5 MG PO TABS: 12.5000 mg | ORAL_TABLET | Freq: Two times a day (BID) | ORAL | 3 refills | Status: DC

## 2021-11-20 ENCOUNTER — Encounter: Payer: Self-pay | Admitting: Medical-Surgical

## 2021-11-23 ENCOUNTER — Emergency Department (HOSPITAL_BASED_OUTPATIENT_CLINIC_OR_DEPARTMENT_OTHER)
Admission: EM | Admit: 2021-11-23 | Discharge: 2021-11-23 | Disposition: A | Discharge status: Home / Self Care | Payer: 59 | Attending: Emergency Medicine | Admitting: Emergency Medicine

## 2021-11-23 ENCOUNTER — Other Ambulatory Visit: Payer: Self-pay

## 2021-11-23 ENCOUNTER — Encounter (HOSPITAL_BASED_OUTPATIENT_CLINIC_OR_DEPARTMENT_OTHER): Payer: Self-pay

## 2021-11-23 ENCOUNTER — Emergency Department (HOSPITAL_BASED_OUTPATIENT_CLINIC_OR_DEPARTMENT_OTHER): Payer: 59

## 2021-11-23 DIAGNOSIS — Z79899 Other long term (current) drug therapy: Secondary | ICD-10-CM | POA: Diagnosis not present

## 2021-11-23 DIAGNOSIS — K625 Hemorrhage of anus and rectum: Secondary | ICD-10-CM | POA: Insufficient documentation

## 2021-11-23 DIAGNOSIS — R1084 Generalized abdominal pain: Secondary | ICD-10-CM | POA: Insufficient documentation

## 2021-11-23 LAB — LIPASE, BLOOD: Lipase: 26 U/L (ref 11–51)

## 2021-11-23 LAB — COMPREHENSIVE METABOLIC PANEL
ALT: 25 U/L (ref 0–44)
AST: 23 U/L (ref 15–41)
Albumin: 3.7 g/dL (ref 3.5–5.0)
Alkaline Phosphatase: 75 U/L (ref 38–126)
Anion gap: 6 (ref 5–15)
BUN: 8 mg/dL (ref 6–20)
CO2: 27 mmol/L (ref 22–32)
Calcium: 8.6 mg/dL — ABNORMAL LOW (ref 8.9–10.3)
Chloride: 108 mmol/L (ref 98–111)
Creatinine, Ser: 0.92 mg/dL (ref 0.61–1.24)
GFR, Estimated: >60 mL/min (ref >60)
Glucose, Bld: 130 mg/dL — ABNORMAL HIGH (ref 70–99)
Potassium: 2.9 mmol/L — ABNORMAL LOW (ref 3.5–5.1)
Sodium: 141 mmol/L (ref 135–145)
Total Bilirubin: 0.5 mg/dL (ref 0.3–1.2)
Total Protein: 7.5 g/dL (ref 6.5–8.1)

## 2021-11-23 LAB — CBC
HCT: 38.4 % — ABNORMAL LOW (ref 39.0–52.0)
Hemoglobin: 12.9 g/dL — ABNORMAL LOW (ref 13.0–17.0)
MCH: 26.9 pg (ref 26.0–34.0)
MCHC: 33.6 g/dL (ref 30.0–36.0)
MCV: 80 fL (ref 80.0–100.0)
Platelets: 298 10*3/uL (ref 150–400)
RBC: 4.8 MIL/uL (ref 4.22–5.81)
RDW: 12.8 % (ref 11.5–15.5)
WBC: 5.7 10*3/uL (ref 4.0–10.5)
nRBC: 0 % (ref 0.0–0.2)

## 2021-11-23 LAB — OCCULT BLOOD X 1 CARD TO LAB, STOOL: Fecal Occult Bld: POSITIVE — AB

## 2021-11-23 MED ORDER — IOHEXOL 300 MG/ML  SOLN
100.0000 mL | Freq: Once | INTRAMUSCULAR | Status: AC | PRN
  Administered 2021-11-23: 100 mL via INTRAVENOUS

## 2021-11-23 NOTE — ED Triage Notes (Signed)
Patient c/o bleeding x 3-4 weeks - Patient suspected it was just hemorrhoids but keeps persisting. States pain is in lower abd.

## 2021-11-23 NOTE — Discharge Instructions (Signed)
As discussed, follow-up with gastroenterology, Dr. Ewing Schlein.  You will need further work-up for blood in your stool.  As discussed some abnormal lymph nodes in your CT scan and need colonoscopy to further rule out cancerous process.  Please also follow-up with your primary care doctor for them to follow along.  You may also need a PET scan to further evaluate.

## 2021-11-23 NOTE — ED Notes (Signed)
Pt transported to CT ?

## 2021-11-23 NOTE — ED Provider Notes (Signed)
[facility] EMERGENCY DEPARTMENT Provider Note   CSN: [id number] Arrival date & time: 11/23/21  1149     History  Chief Complaint  Patient presents with   Abdominal Pain   Rectal Bleeding    Zachary Cruz is a 41 y.o. male.  The history is provided by the patient.  Abdominal Pain Pain location:  Generalized Pain quality: aching   Pain radiates to:  Does not radiate Pain severity:  Mild Onset quality:  Gradual Duration:  3 weeks Timing:  Intermittent Progression:  Waxing and waning Chronicity:  New Context comment:  Blood in stool intermittently for the past few weeks with abdominal pain during that time, denies constiapation. hx of hemorrhoids. not on blood thinner Relieved by:  Nothing Worsened by:  Nothing Associated symptoms: hematochezia   Associated symptoms: no anorexia, no belching, no chest pain, no chills, no constipation, no cough, no diarrhea, no dysuria, no fatigue, no fever, no flatus, no hematemesis, no melena, no nausea, no shortness of breath, no sore throat and no vomiting   Risk factors: no alcohol abuse   Rectal Bleeding Associated symptoms: abdominal pain   Associated symptoms: no fever, no hematemesis and no vomiting        Home Medications Prior to Admission medications   Medication Sig Start Date End Date Taking? Authorizing Provider  amLODipine (NORVASC) 10 MG tablet TAKE 1 TABLET(10 MG) BY MOUTH DAILY. 07/28/20   Tobb, Kardie, DO  carvedilol (COREG) 12.5 MG tablet Take 1 tablet (12.5 mg total) by mouth 2 (two) times daily with a meal. 08/17/21   Tobb, Kardie, DO  cetirizine (ZYRTEC) 10 MG tablet Take 1 tablet (10 mg) by mouth once daily    [provider]  losartan (COZAAR) 25 MG tablet Take 1 tablet (25 mg total) by mouth in the morning and at bedtime. 07/28/20 07/28/21  Tobb, Lavona Mound, DO  Multiple Vitamins-Minerals (DAILY MULTIPLE VITAMINS/MIN PO) Take 1 tablet by mouth once daily    [provider]       Allergies    Patient has no known allergies.    Review of Systems   Review of Systems  Constitutional:  Negative for chills, fatigue and fever.  HENT:  Negative for sore throat.   Respiratory:  Negative for cough and shortness of breath.   Cardiovascular:  Negative for chest pain.  Gastrointestinal:  Positive for abdominal pain and hematochezia. Negative for anorexia, constipation, diarrhea, flatus, hematemesis, melena, nausea and vomiting.  Genitourinary:  Negative for dysuria.    Physical Exam Updated Vital Signs BP (!) 144/91   Pulse 68   Temp 98.3 F (36.8 C) (Oral)   Resp 16   Ht 6\' 1"  (1.854 m)   Wt (!) 140.6 kg   SpO2 100%   BMI 40.90 kg/m  Physical Exam Vitals and nursing note reviewed.  Constitutional:      General: He is not in acute distress.    Appearance: He is well-developed. He is not ill-appearing.  HENT:     Head: Normocephalic and atraumatic.     Mouth/Throat:     Mouth: Mucous membranes are moist.  Eyes:     Extraocular Movements: Extraocular movements intact.     Conjunctiva/sclera: Conjunctivae normal.  Cardiovascular:     Rate and Rhythm: Normal rate and regular rhythm.     Heart sounds: Normal heart sounds. No murmur heard. Pulmonary:     Effort: Pulmonary effort is normal. No respiratory distress.     Breath sounds: Normal  breath sounds.  Abdominal:     General: Abdomen is flat.     Palpations: Abdomen is soft.     Tenderness: There is generalized abdominal tenderness. There is no guarding or rebound.  Genitourinary:    Rectum: Normal. Guaiac result positive (brown stool but blood intermixed slighty). No mass or tenderness.  Musculoskeletal:        General: No swelling.     Cervical back: Neck supple.  Skin:    General: Skin is warm and dry.     Capillary Refill: Capillary refill takes less than 2 seconds.  Neurological:     Mental Status: He is alert.  Psychiatric:        Mood and Affect: Mood normal.     ED Results /  Procedures / Treatments   Labs (all labs ordered are listed, but only abnormal results are displayed) Labs Reviewed  COMPREHENSIVE METABOLIC PANEL - Abnormal; Notable for the following components:      Result Value   Potassium 2.9 (*)    Glucose, Bld 130 (*)    Calcium 8.6 (*)    All other components within normal limits  CBC - Abnormal; Notable for the following components:   Hemoglobin 12.9 (*)    HCT 38.4 (*)    All other components within normal limits  OCCULT BLOOD X 1 CARD TO LAB, STOOL - Abnormal; Notable for the following components:   Fecal Occult Bld POSITIVE (*)    All other components within normal limits  LIPASE, BLOOD  POC OCCULT BLOOD, ED    EKG None  Radiology CT ABDOMEN PELVIS W CONTRAST  Result Date: 11/23/2021 CLINICAL DATA:  Abdominal pain and rectal bleeding for 3 weeks EXAM: CT ABDOMEN AND PELVIS WITH CONTRAST TECHNIQUE: Multidetector CT imaging of the abdomen and pelvis was performed using the standard protocol following bolus administration of intravenous contrast. RADIATION DOSE REDUCTION: This exam was performed according to the departmental dose-optimization program which includes automated exposure control, adjustment of the mA and/or kV according to patient size and/or use of iterative reconstruction technique. CONTRAST:  OMNIPAQUE IOHEXOL 300 MG/ML SOLN IV. No oral contrast. COMPARISON:  None Available. FINDINGS: Lower chest: Lung bases clear Hepatobiliary: Gallbladder surgically absent. Liver normal appearance. No biliary dilatation. Pancreas: Normal appearance Spleen: Normal appearance Adrenals/Urinary Tract: Adrenal glands, kidneys, and ureters normal appearance. Bladder contains minimal urine, wall mildly prominent thickness though this is felt to be an artifact related underdistention. Stomach/Bowel: Normal appendix. Stomach and bowel loops normal appearance. Vascular/Lymphatic: Aorta normal caliber. Vascular structures patent. No adenopathy.  Several small nodules are seen within perirectal fat measuring up to 11 mm diameter. These could represent perirectal nodes. No additional adenopathy. Reproductive: Prostate gland and seminal vesicles unremarkable Other: Trace nonspecific free pelvic fluid. LEFT inguinal hernia containing fat. Tiny umbilical hernia containing fat. No free air. Musculoskeletal: Osseous structures unremarkable. IMPRESSION: LEFT inguinal hernia containing fat., Small amount of nonspecific free pelvic fluid. No definite cause for rectal bleeding identified; recommend colonoscopy to exclude occult mass. Four perirectal nodules/lymph nodes up to 11 mm diameter, abnormal; these are concerning for occult malignancy, consider PET-CT assessment. Findings called to Dr. Lockie Mola On 11/23/2021 at 1336 hours. Electronically Signed   By: Ulyses Southward M.D.   On: 11/23/2021 13:38    Procedures Procedures    Medications Ordered in ED Medications  iohexol (OMNIPAQUE) 300 MG/ML solution 100 mL (100 mLs Intravenous Contrast Given 11/23/21 1305)    ED Course/ Medical Decision Making/ A&P  Medical Decision Making Amount and/or Complexity of Data Reviewed Labs: ordered. Radiology: ordered.  Risk Prescription drug management.   Zachary Cruz is here with blood in his stool, lower abdominal cramping for the last several weeks.  Normal vitals.  No fever.  No significant medical history.  History of hypertension.  Not on blood thinners.  History of A-fib in the past.  Nothing makes it worse or better.  Denies any constipation.  Denies any pain urination.  Patient mildly tender in his abdomen.  Hemoccult is positive but his stools grossly brown.  He has a history of hemorrhoids.  Differential diagnosis is likely that this is hemorrhoid related.  I have much lower concern for acute GI bleed.  However could be from cancerous process.  Does not have any risk factors however.  CBC, CMP, lipase, CT scan of the  abdomen and pelvis ordered.  Per my review and interpretation of labs is no significant anemia, electrolyte abnormality, kidney injury.  Hemoglobin within normal limits.  CT scan shows 4 perirectal nodule/lymph nodes that seem to be abnormal.  However there is no obvious mass.  Talked on the phone with radiology about this.  He recommended outpatient follow-up with GI for colonoscopy and may need PET/CT as well.  I will provide him with outpatient follow-up with GI and recommend that he follow-up with his primary care doctor to further discuss these findings as well.  He was made aware of these abnormal lymph nodes and need for further oncology work-up.  Overall have no concern for acute GI bleed.  Patient discharged in good condition.  Understands return precautions.  This chart was dictated using voice recognition software.  Despite best efforts to proofread,  errors can occur which can change the documentation meaning.         Final Clinical Impression(s) / ED Diagnoses Final diagnoses:  Rectal bleeding  Generalized abdominal pain    Rx / DC Orders ED Discharge Orders     None         Virgina Norfolk, DO 11/23/21 1354

## 2021-11-24 ENCOUNTER — Ambulatory Visit: Payer: 59 | Admitting: Medical-Surgical

## 2021-11-24 ENCOUNTER — Encounter: Payer: Self-pay | Admitting: Medical-Surgical

## 2021-11-24 DIAGNOSIS — K625 Hemorrhage of anus and rectum: Secondary | ICD-10-CM

## 2021-11-24 DIAGNOSIS — R1084 Generalized abdominal pain: Secondary | ICD-10-CM

## 2021-11-25 ENCOUNTER — Telehealth: Payer: Self-pay | Admitting: General Practice

## 2021-11-25 NOTE — Telephone Encounter (Signed)
Transition Care Management Follow-up Telephone Call Date of discharge and from where: 11/23/21 from [facility] How have you been since you were released from the hospital? He is still experiencing some bleeding from hemorrhoids. He has been referred to GI and is waiting for an appointment. Any questions or concerns? No  Items Reviewed: Did the pt receive and understand the discharge instructions provided? Yes  Medications obtained and verified? No  Other? No  Any new allergies since your discharge? No  Dietary orders reviewed? Yes Do you have support at home? Yes   Home Care and Equipment/Supplies: Were home health services ordered? no  Functional Questionnaire: (I = Independent and D = Dependent) ADLs: I  Bathing/Dressing- I  Meal Prep- I  Eating- I  Maintaining continence- I  Transferring/Ambulation- I  Managing Meds- I  Follow up appointments reviewed:  PCP Hospital f/u appt confirmed? No  Patient will call back to make an appt if needed. Specialist Hospital f/u appt confirmed?  Pended referral for PCP to sign off   Are transportation arrangements needed? No  If their condition worsens, is the pt aware to call PCP or go to the Emergency Dept.? Yes Was the patient provided with contact information for the PCP's office or ED? Yes Was to pt encouraged to call back with questions or concerns? Yes

## 2021-11-28 NOTE — Telephone Encounter (Signed)
Patient called wanting a referral to GI that is in the [place] system.

## 2021-12-16 ENCOUNTER — Ambulatory Visit: Payer: 59 | Admitting: Medical-Surgical

## 2021-12-16 ENCOUNTER — Encounter: Payer: Self-pay | Admitting: Medical-Surgical

## 2021-12-16 VITALS — BP 120/78 | HR 74 | Resp 20 | Ht 73.0 in | Wt 319.0 lb

## 2021-12-16 DIAGNOSIS — Z23 Encounter for immunization: Secondary | ICD-10-CM

## 2021-12-16 DIAGNOSIS — Z09 Encounter for follow-up examination after completed treatment for conditions other than malignant neoplasm: Secondary | ICD-10-CM

## 2021-12-16 NOTE — Progress Notes (Signed)
Established Patient Office Visit  Subjective   Patient ID: Zachary Cruz, male   DOB: [birth year] Age: 41 y.o. MRN: [id number]   Chief Complaint  Patient presents with   Follow-up   HPI Pleasant 41 year old male presenting today for hospital discharge follow-up after being seen on 9/13 with rectal bleeding.  During his hospitalization, he had normal labs with no significant issues.  They did a CT scan which showed for perirectal nodules/lymph nodes that they recommended further follow-up for.  He was discharged with a diagnosis of bleeding hemorrhoids and advised to see GI for a possible colonoscopy.  He has been able to schedule an appointment with [facility] and will be having a colonoscopy on the 17th.  He does have a history of hemorrhoids that were previously managed by dietary and lifestyle modifications but these did not work to stop his bleeding which prompted him to visit the ER.  He is currently increasing his dietary fiber and that seems to be helping.  He has had no active bleeding from the rectum for the last week.  Staying well-hydrated and avoiding constipation.  Staying physically active.   Vitals:   12/16/21 1048  BP: 120/78  Pulse: 74  Resp: 20  Height: 6\' 1"  (1.854 m)  Weight: (!) 319 lb (144.7 kg)  SpO2: 99%  BMI (Calculated): 42.1   Physical Exam Vitals reviewed.  Constitutional:      General: He is not in acute distress.    Appearance: Normal appearance. He is obese. He is not ill-appearing.  HENT:     Head: Normocephalic and atraumatic.  Cardiovascular:     Rate and Rhythm: Normal rate and regular rhythm.     Pulses: Normal pulses.     Heart sounds: Normal heart sounds. No murmur heard.    No friction rub. No gallop.  Pulmonary:     Effort: Pulmonary effort is normal. No respiratory distress.     Breath sounds: Normal breath sounds.  Skin:    General: Skin is warm and dry.  Neurological:     Mental Status: He is alert and oriented to person, place,  and time.  Psychiatric:        Mood and Affect: Mood normal.        Behavior: Behavior normal.        Thought Content: Thought content normal.        Judgment: Judgment normal.      No results found for this or any previous visit (from the past 24 hour(s)).     The 10-year ASCVD risk score (Arnett DK, et al., 2019) is: 4.5%   Values used to calculate the score:     Age: 26 years     Sex: Male     Is Non-Hispanic African American: Yes     Diabetic: No     Tobacco smoker: No     Systolic Blood Pressure: 120 mmHg     Is BP treated: Yes     HDL Cholesterol: 56 mg/dL     Total Cholesterol: 176 mg/dL   Assessment & Plan:   1. Hospital discharge follow-up Reviewed hospital records and discussed the care plan with the patient.  He is already scheduled with GI so no need to enter any further referrals.  Would like to see what his colonoscopy shows and what GI plans after that.  If they do not perform any other imaging regarding the lymph nodes/nodules, this is something we will be able to order  here so advised him to reach out and let me know if he needs me.  2. Need for influenza vaccination Flu vaccine given in office today. - Flu Vaccine QUAD 6+ mos PF IM (Fluarix Quad PF)   Return if symptoms worsen or fail to improve.  ___________________________________________ Clearnce Sorrel, DNP, APRN, FNP-BC [facility] [place]

## 2022-01-19 IMAGING — DX DG CHEST 2V
2 series · 2 of 2 positions shown · non-contrast
Comparison: March 14, 2020

CLINICAL DATA: Chest tightness

EXAM:
CHEST - 2 VIEW

[chest pa]
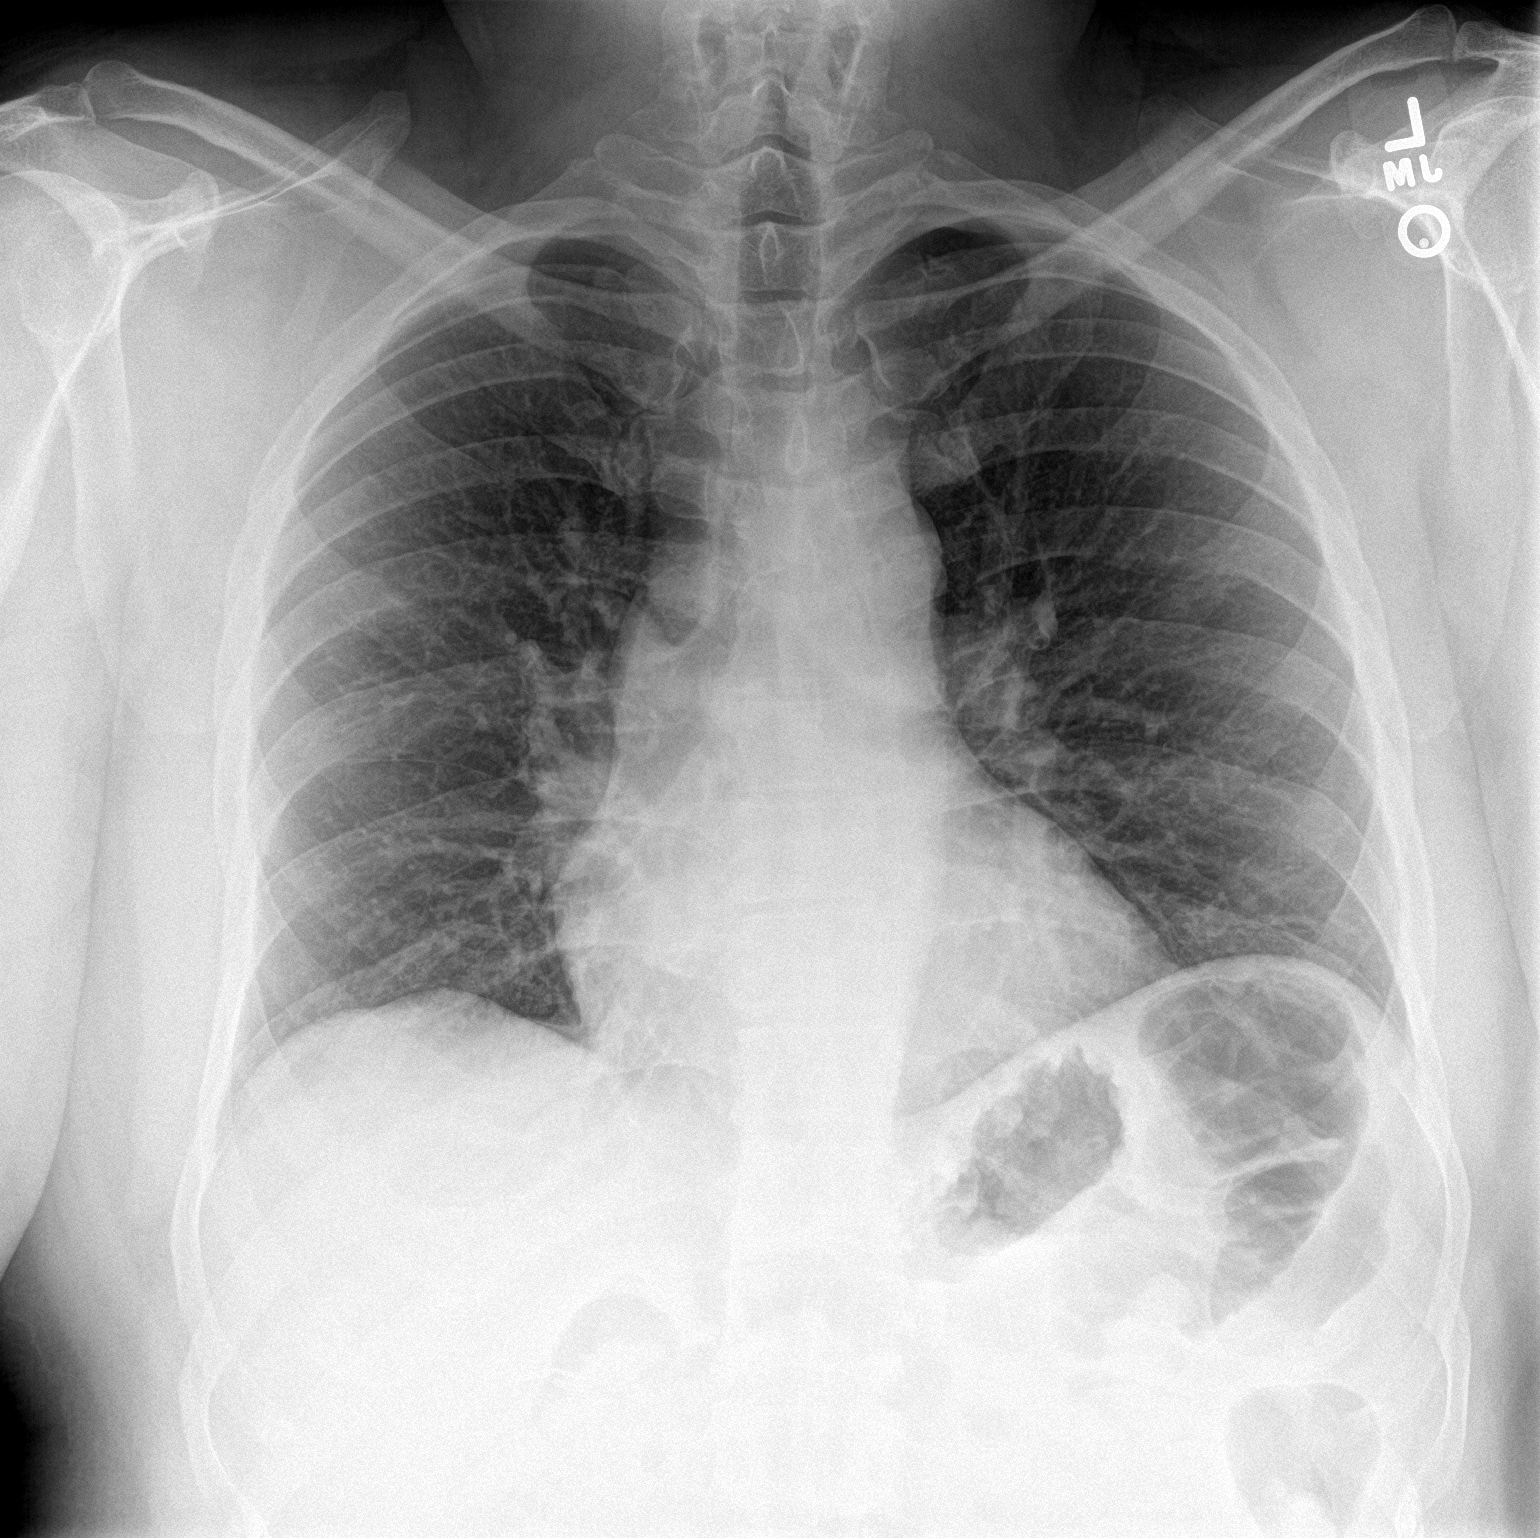

[chest lat]
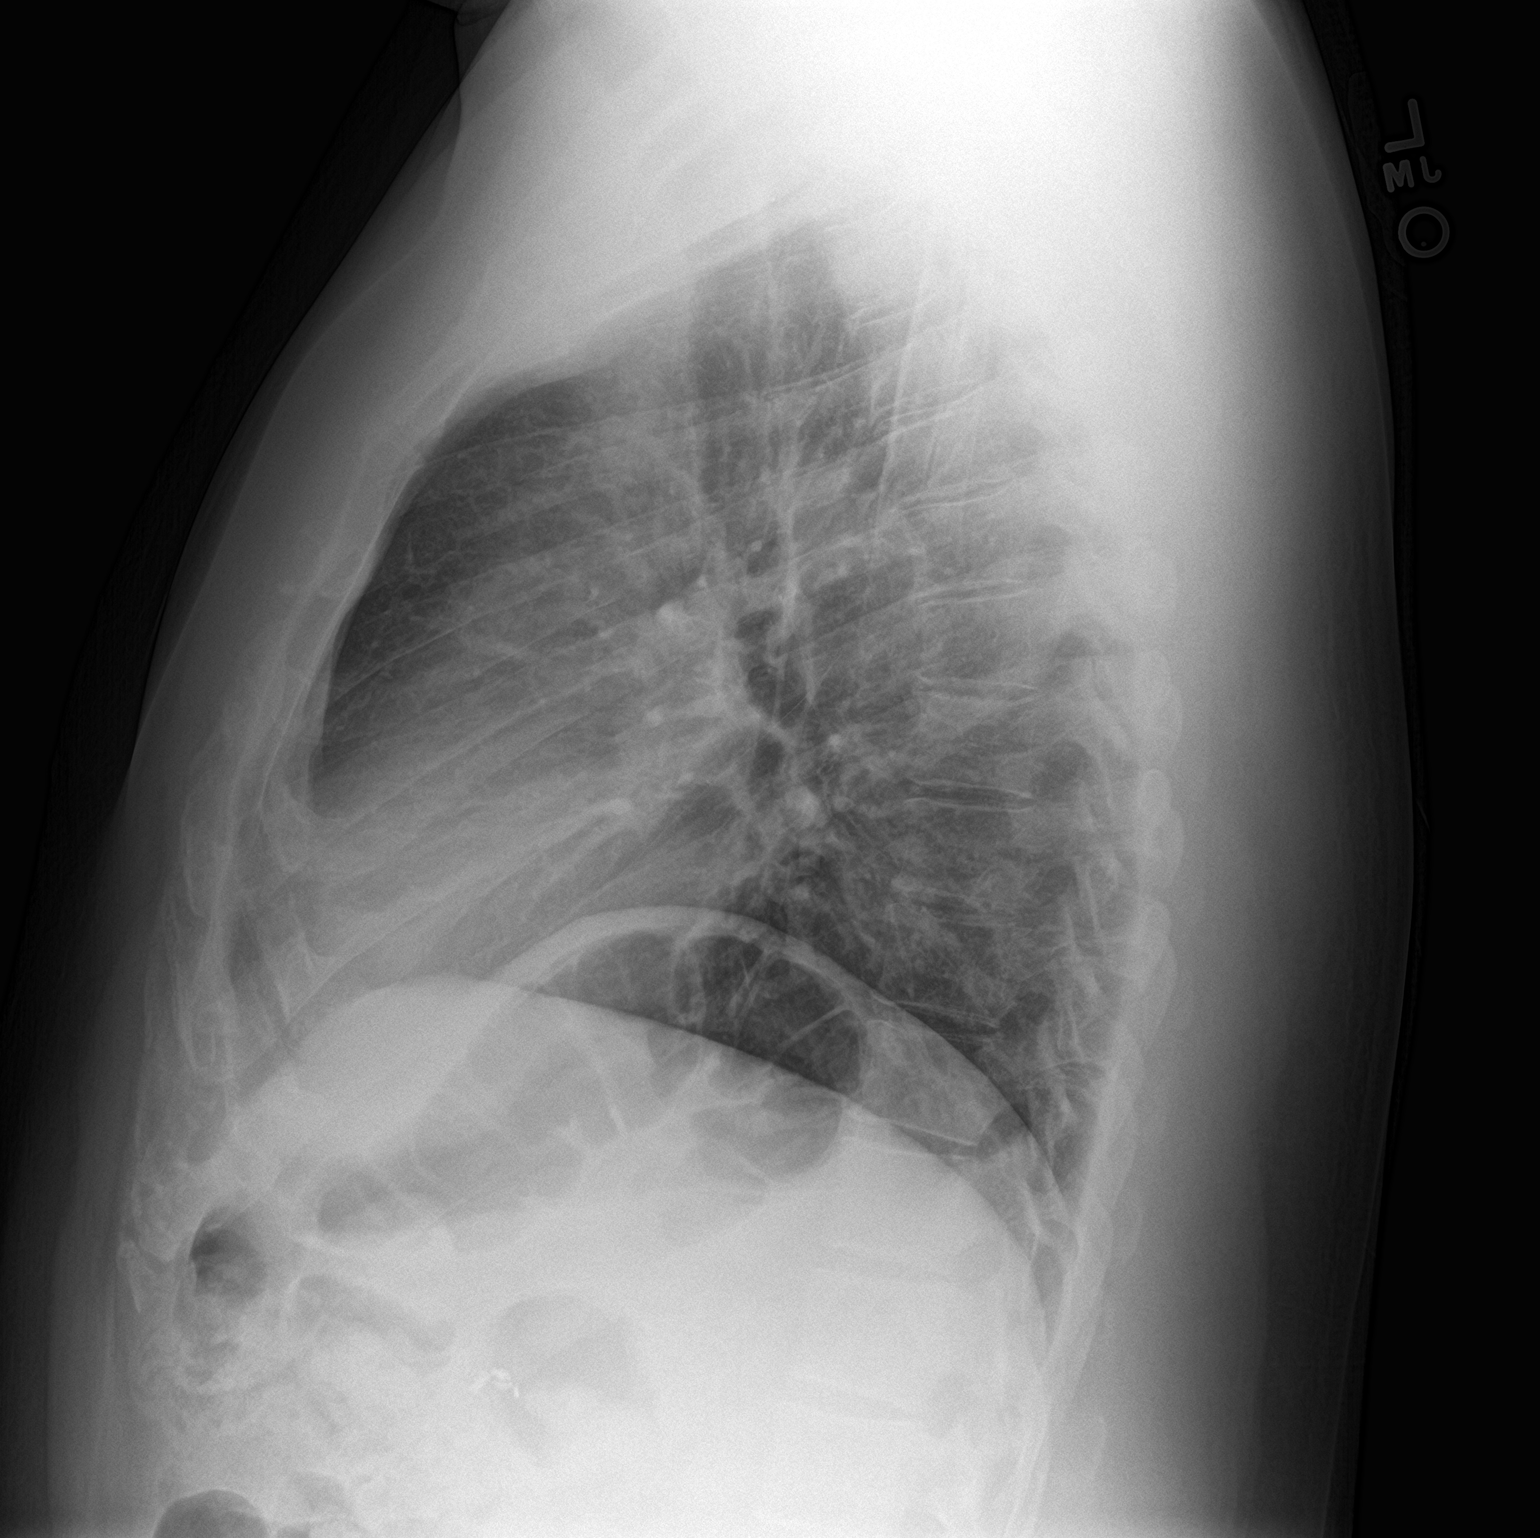

[2 of 2 positions shown; findings below may reference images not displayed]

FINDINGS: The cardiomediastinal silhouette is unchanged in contour. No pleural
effusion. No pneumothorax. No acute pleuroparenchymal abnormality.
Status post cholecystectomy. No acute osseous abnormality.
IMPRESSION: No acute cardiopulmonary abnormality.

## 2022-02-08 ENCOUNTER — Telehealth: Payer: Self-pay | Admitting: Cardiology

## 2022-02-08 MED ORDER — LOSARTAN POTASSIUM 25 MG PO TABS: 25.0000 mg | ORAL_TABLET | Freq: Two times a day (BID) | ORAL | 3 refills | Status: DC

## 2022-02-08 NOTE — Telephone Encounter (Signed)
*  STAT* If patient is at the pharmacy, call can be transferred to refill team.   1. Which medications need to be refilled? (please list name of each medication and dose if known)   losartan (COZAAR) 25 MG tablet    2. Which pharmacy/location (including street and city if local pharmacy) is medication to be sent to?  [id number] - [place], [place] - [address]    3. Do they need a 30 day or 90 day supply? 90  Pt is completely out

## 2022-03-12 ENCOUNTER — Encounter: Payer: Self-pay | Admitting: Medical-Surgical

## 2022-03-17 ENCOUNTER — Encounter: Payer: Self-pay | Admitting: Cardiology

## 2022-04-03 ENCOUNTER — Other Ambulatory Visit: Payer: Self-pay

## 2022-04-03 MED ORDER — AMLODIPINE BESYLATE 10 MG PO TABS: ORAL_TABLET | ORAL | 3 refills | Status: DC

## 2022-04-06 ENCOUNTER — Ambulatory Visit: Payer: 59 | Attending: Internal Medicine | Admitting: Pharmacist Clinician (PhC)/ Clinical Pharmacy Specialist

## 2022-04-06 ENCOUNTER — Encounter: Payer: Self-pay | Admitting: Pharmacist Clinician (PhC)/ Clinical Pharmacy Specialist

## 2022-04-06 VITALS — BP 128/86 | HR 90

## 2022-04-06 DIAGNOSIS — I10 Essential (primary) hypertension: Secondary | ICD-10-CM | POA: Diagnosis not present

## 2022-04-06 NOTE — Progress Notes (Signed)
Office Visit    Patient Name: Zachary Cruz Date of Encounter: 04/10/2022  Primary Care Provider:  Samuel Bouche, NP Primary Cardiologist:  Nelva Bush, MD  Chief Complaint    Hypertension  Significant Past Medical History   HTN amlodipine 10 mg, losartan 25 mg BID  Afib carvedilol 12.5 mg BID  OSA CPAP    No Known Allergies  History of Present Illness    Zachary Cruz is a 42 year old male who presents to the hypertension clinic today after experiencing gum issues while on amlodipine. His last cardiology visit was in May with Dr. Harriet Masson and his BP was 130/82 on amlodipine 10 mg, losartan 25 mg BID, and carvedilol 12.5 mg BID. He recently started using an Invisalign for his teeth and his orthodontist mentioned that his gums were inflamed and it could be due to his medication. He is still taking amlodipine daily as well as his other medications. He was recently diagnosed with ulcerative colitis and has started taking mesalamine 1.2g (2 tabs daily).   Blood Pressure Goal:  130/80  Current Medications: amlodipine 10 mg daily, losartan 25 mg BID, carvedilol 12.5 mg BID  Adherence Assessment: Do you ever forget to take your medication?  [] Yes [x] No  Do you ever skip doses due to side effects?  [] Yes [x] No  Do you have trouble affording your medicines? [] Yes [] No  Are you ever unable to pick up your medication due to transportation difficulties? [] Yes [] No  Do you ever stop taking your medications because you don't believe they are helping? [] Yes [] No  Do you check your weight daily? [] Yes [] No   Adherence: uses a 7 days pill pack   AM: carvedilol 12.5mg , losartan 25 mg, amlodipine 10 mg, mesalamine 1.2g PM: carvedilol 12.5 mg, losartan 25 mg  Previously tried: none  Family Hx: grandfather passed from a heart attack  Social Hx:     Tobacco: none Alcohol: none Caffeine: none  Diet: Mostly cooks at home due to colitis (fish, chicken, brussel sprouts, okra,  sweet potatoes), adds salt to food, eats multiple little debbies a day and chips. Drinks mostly water but also has gatorade and pineapple juice.   Exercise: no regular exercise, usually walks and lifts weights once per week but it's not consistent.    Home BP readings: Does not check BP regularly at home; last home BP was 2 weeks ago because he wasn't feeling well and it was 130/90.   Accessory Clinical Findings    Lab Results  Component Value Date   CREATININE 0.92 11/23/2021   BUN 8 11/23/2021   NA 141 11/23/2021   K 2.9 (L) 11/23/2021   CL 108 11/23/2021   CO2 27 11/23/2021   Lab Results  Component Value Date   ALT 25 11/23/2021   AST 23 11/23/2021   ALKPHOS 75 11/23/2021   BILITOT 0.5 11/23/2021   Lab Results  Component Value Date   HGBA1C 5.9 (H) 07/29/2021    Home Medications    Current Outpatient Medications  Medication Sig Dispense Refill   amLODipine (NORVASC) 10 MG tablet TAKE 1 TABLET(10 MG) BY MOUTH DAILY. 90 tablet 3   carvedilol (COREG) 12.5 MG tablet Take 1 tablet (12.5 mg total) by mouth 2 (two) times daily with a meal. 180 tablet 3   cetirizine (ZYRTEC) 10 MG tablet Take 1 tablet (10 mg) by mouth once daily     losartan (COZAAR) 25 MG tablet Take 1 tablet (25 mg total) by mouth in the  morning and at bedtime. 180 tablet 3   Multiple Vitamins-Minerals (DAILY MULTIPLE VITAMINS/MIN PO) Take 1 tablet by mouth once daily     No current facility-administered medications for this visit.     BP 128/86  HR 90  Assessment & Plan    HTN (hypertension) Assessment: BP is controlled in office BP 128/86; goal <130/80 Patient is unable to tolerate amlodipine due to gum inflammation  Tolerates losartan and carvedilol well without side effects Denies SOB, palpitation, chest pain, or headaches Patient would like to work on lifestyle modifications first before adding on another medication  Plan: Stop taking amlodipine 10 mg daily and continue taking losartan 25 mg  BID and carvedilol 12.5 mg BID.  Patient is going to work on lifestyle modifications to lower BP: low salt diet, regular exercise, lose weight Patient to start taking BP at home daily and send the readings via MyChart in 3 weeks Patient to bring BP log and home BP machine to next visit for validation Follow up with PharmD hypertension clinic in 6 weeks.   [place]  Class of 2024  I was with student and patient for entire appointment and agree with above assessment and plan.  Tommy Medal PharmD CPP [facility]  [address] [place], [place] [address] [phone]

## 2022-04-06 NOTE — Assessment & Plan Note (Signed)
Assessment: BP is controlled in office BP 128/86; goal <130/80 Patient is unable to tolerate amlodipine due to gum inflammation  Tolerates losartan and carvedilol well without side effects Denies SOB, palpitation, chest pain, or headaches Patient would like to work on lifestyle modifications first before adding on another medication  Plan: Stop taking amlodipine 10 mg daily and continue taking losartan 25 mg BID and carvedilol 12.5 mg BID.  Patient is going to work on lifestyle modifications to lower BP: low salt diet, regular exercise, lose weight Patient to start taking BP at home daily and send the readings via MyChart in 3 weeks Patient to bring BP log and home BP machine to next visit for validation Follow up with PharmD hypertension clinic in 6 weeks.

## 2022-04-06 NOTE — Assessment & Plan Note (Signed)
>>  ASSESSMENT AND PLAN FOR HTN (HYPERTENSION) WRITTEN ON 04/06/2022  4:17 PM BY ALVSTAD, KRISTIN L, RPH-CPP  Assessment: BP is controlled in office BP 128/86; goal <130/80 Patient is unable to tolerate amlodipine due to gum inflammation  Tolerates losartan and carvedilol well without side effects Denies SOB, palpitation, chest pain, or headaches Patient would like to work on lifestyle modifications first before adding on another medication  Plan: Stop taking amlodipine 10 mg daily and continue taking losartan 25 mg BID and carvedilol 12.5 mg BID.  Patient is going to work on lifestyle modifications to lower BP: low salt diet, regular exercise, lose weight Patient to start taking BP at home daily and send the readings via MyChart in 3 weeks Patient to bring BP log and home BP machine to next visit for validation Follow up with PharmD hypertension clinic in 6 weeks.

## 2022-04-06 NOTE — Patient Instructions (Signed)
Follow up appointment: Thursday March 7 at 9:30 am  Take your BP meds as follows:  Stop amlodipine for now.  Monitor your blood pressure at home and keep track of the readings.  Let us know in about 3 weeks what those readings are.  You can reach me through [place]  - send to Tommy Medal PharmD.  If my name doesn't appear, send to Dr. Saunders Revel with a note to forward to the Pharmacist.    Check your blood pressure at home daily (if able) and keep record of the readings.  Hypertension "High blood pressure"  Hypertension is often called "The Silent Killer." It rarely causes symptoms until it is extremely  high or has done damage to other organs in the body. For this reason, you should have your  blood pressure checked regularly by your physician. We will check your blood pressure  every time you see a provider at one of our offices.   Your blood pressure reading consists of two numbers. Ideally, blood pressure should be  below 120/80. The first ("top") number is called the systolic pressure. It measures the  pressure in your arteries as your heart beats. The second ("bottom") number is called the diastolic pressure. It measures the pressure in your arteries as the heart relaxes between beats.  The benefits of getting your blood pressure under control are enormous. A 10-point  reduction in systolic blood pressure can reduce your risk of stroke by 27% and heart failure by 28%  Your blood pressure goal is < 130/80  To check your pressure at home you will need to:  1. Sit up in a chair, with feet flat on the floor and back supported. Do not cross your ankles or legs. 2. Rest your left arm so that the cuff is about heart level. If the cuff goes on your upper arm,  then just relax the arm on the table, arm of the chair or your lap. If you have a wrist cuff, we  suggest relaxing your wrist against your chest (think of it as Pledging the Flag with the  wrong arm).  3. Place the cuff snugly  around your arm, about 1 inch above the crook of your elbow. The  cords should be inside the groove of your elbow.  4. Sit quietly, with the cuff in place, for about 5 minutes. After that 5 minutes press the power  button to start a reading. 5. Do not talk or move while the reading is taking place.  6. Record your readings on a sheet of paper. Although most cuffs have a memory, it is often  easier to see a pattern developing when the numbers are all in front of you.  7. You can repeat the reading after 1-3 minutes if it is recommended  Make sure your bladder is empty and you have not had caffeine or tobacco within the last 30 min  Always bring your blood pressure log with you to your appointments. If you have not brought your monitor in to be double checked for accuracy, please bring it to your next appointment.  You can find a list of quality blood pressure cuffs at validatebp.org

## 2022-04-10 ENCOUNTER — Encounter: Payer: Self-pay | Admitting: Pharmacist Clinician (PhC)/ Clinical Pharmacy Specialist

## 2022-04-11 ENCOUNTER — Ambulatory Visit: Payer: 59 | Admitting: Medical-Surgical

## 2022-04-19 ENCOUNTER — Encounter: Payer: Self-pay | Admitting: Cardiology

## 2022-04-19 ENCOUNTER — Encounter: Payer: Self-pay | Admitting: Pharmacist Clinician (PhC)/ Clinical Pharmacy Specialist

## 2022-04-19 ENCOUNTER — Other Ambulatory Visit: Payer: Self-pay | Admitting: Pharmacist Clinician (PhC)/ Clinical Pharmacy Specialist

## 2022-04-19 MED ORDER — OLMESARTAN MEDOXOMIL 40 MG PO TABS: 40.0000 mg | ORAL_TABLET | Freq: Every day | ORAL | 6 refills | Status: DC

## 2022-04-27 ENCOUNTER — Ambulatory Visit: Payer: 59 | Admitting: Medical-Surgical

## 2022-05-08 ENCOUNTER — Ambulatory Visit: Payer: 59 | Admitting: Medical-Surgical

## 2022-05-08 ENCOUNTER — Encounter: Payer: Self-pay | Admitting: Medical-Surgical

## 2022-05-08 VITALS — BP 145/85 | HR 65 | Resp 20 | Ht 73.0 in | Wt 325.2 lb

## 2022-05-08 DIAGNOSIS — I10 Essential (primary) hypertension: Secondary | ICD-10-CM | POA: Diagnosis not present

## 2022-05-08 DIAGNOSIS — R7303 Prediabetes: Secondary | ICD-10-CM | POA: Diagnosis not present

## 2022-05-08 DIAGNOSIS — Z113 Encounter for screening for infections with a predominantly sexual mode of transmission: Secondary | ICD-10-CM | POA: Diagnosis not present

## 2022-05-08 NOTE — Progress Notes (Signed)
Established Patient Office Visit  Subjective   Patient ID: Zachary Cruz, male   DOB: [birth year] Age: 42 y.o. MRN: [id number]   Chief Complaint  Patient presents with   Exposure to STD   HPI Pleasant 42 year old male presenting today for STI testing.  Reports that he has been active with 1 male sexual partner in the past 6 months.  They use condoms with all encounters however he would like to get tested for any potential STIs today.  Notes that he had a colonoscopy and was told that he had proctitis which could have been a result of a sexually transmitted infection.  Also reports that his ex-wife had extramarital encounters approximately 3 years ago.  Denies any current symptoms although he does state he has a rash on the head of his penis that he feels is related to jock itch.  Blood pressure is elevated on arrival.  He is taking Coreg 12.5 mg twice daily and olmesartan 40 mg daily.  Has a current prescription for amlodipine on record however is not taking that.  Has been checking his blood pressure and notes over the last few days, it has been significantly elevated with systolics in the Q000111Q and diastolics in the 0000000.  Has had some headaches with elevations in blood pressure but no other concerning symptoms such as chest pain, shortness of breath, lower extremity edema, palpitations, vision changes.   Objective:    Vitals:   05/08/22 1025 05/08/22 1117  BP: (!) 152/84 (!) 145/85  Pulse: 76 65  Resp: 20 20  Height: '6\' 1"'$  (1.854 m)   Weight: (!) 325 lb 3.2 oz (147.5 kg)   SpO2: 99% 99%  BMI (Calculated): 42.91     Physical Exam Vitals reviewed.  Constitutional:      General: He is not in acute distress.    Appearance: Normal appearance. He is obese. He is not ill-appearing.  HENT:     Head: Normocephalic and atraumatic.  Cardiovascular:     Rate and Rhythm: Normal rate and regular rhythm.     Pulses: Normal pulses.     Heart sounds: Normal heart sounds. No murmur  heard.    No friction rub. No gallop.  Pulmonary:     Effort: Pulmonary effort is normal. No respiratory distress.     Breath sounds: Normal breath sounds.  Skin:    General: Skin is warm and dry.  Neurological:     Mental Status: He is alert and oriented to person, place, and time.  Psychiatric:        Mood and Affect: Mood normal.        Behavior: Behavior normal.        Thought Content: Thought content normal.        Judgment: Judgment normal.   No results found for this or any previous visit (from the past 24 hour(s)).     The 10-year ASCVD risk score (Arnett DK, et al., 2019) is: 6.4%   Values used to calculate the score:     Age: 30 years     Sex: Male     Is Non-Hispanic African American: Yes     Diabetic: No     Tobacco smoker: No     Systolic Blood Pressure: Q000111Q mmHg     Is BP treated: Yes     HDL Cholesterol: 56 mg/dL     Total Cholesterol: 176 mg/dL   Assessment & Plan:   1. Routine screening for STI (sexually transmitted  infection) STI testing as below.  Running Chlamydia gonorrhea on urine as well as perirectal swab. - Trichomonas vaginalis RNA, Ql,Males - RPR - Hepatitis B surface antigen - Hepatitis C antibody - HIV Antibody (routine testing w rflx) - C. trachomatis/N. gonorrhoeae RNA - C. trachomatis/N. gonorrhoeae RNA  2. Essential hypertension Checking labs as below.  Due for follow-up with cardiology as scheduled on 05/18/2022.  Recommend continuing olmesartan and carvedilol as prescribed.  Resume amlodipine 10 mg daily and continue monitoring blood pressure at home. - CBC with Differential/Platelet - COMPLETE METABOLIC PANEL WITH GFR - Lipid panel  3. Prediabetes Checking hemoglobin A1c. - Hemoglobin A1c  Return in about 6 months (around 11/06/2022) for chronic disease follow up.  ___________________________________________ Clearnce Sorrel, DNP, APRN, FNP-BC [facility] and [place]

## 2022-05-09 LAB — COMPLETE METABOLIC PANEL WITH GFR
AG Ratio: 1.4 (calc) (ref 1.0–2.5)
ALT: 21 U/L (ref 9–46)
AST: 18 U/L (ref 10–40)
Albumin: 4.1 g/dL (ref 3.6–5.1)
Alkaline phosphatase (APISO): 81 U/L (ref 36–130)
BUN: 15 mg/dL (ref 7–25)
CO2: 29 mmol/L (ref 20–32)
Calcium: 9.1 mg/dL (ref 8.6–10.3)
Chloride: 106 mmol/L (ref 98–110)
Creat: 1.07 mg/dL (ref 0.60–1.29)
Globulin: 3 g/dL (calc) (ref 1.9–3.7)
Glucose, Bld: 79 mg/dL (ref 65–99)
Potassium: 3.6 mmol/L (ref 3.5–5.3)
Sodium: 143 mmol/L (ref 135–146)
Total Bilirubin: 0.4 mg/dL (ref 0.2–1.2)
Total Protein: 7.1 g/dL (ref 6.1–8.1)
eGFR: 89 mL/min/{1.73_m2} (ref >=60)

## 2022-05-09 LAB — CBC WITH DIFFERENTIAL/PLATELET
Absolute Monocytes: 383 cells/uL (ref 200–950)
Basophils Absolute: 41 cells/uL (ref 0–200)
Basophils Relative: 0.8 %
Eosinophils Absolute: 408 cells/uL (ref 15–500)
Eosinophils Relative: 8 %
HCT: 39.3 % (ref 38.5–50.0)
Hemoglobin: 13.2 g/dL (ref 13.2–17.1)
Lymphs Abs: 1892 cells/uL (ref 850–3900)
MCH: 27.2 pg (ref 27.0–33.0)
MCHC: 33.6 g/dL (ref 32.0–36.0)
MCV: 80.9 fL (ref 80.0–100.0)
MPV: 9.8 fL (ref 7.5–12.5)
Monocytes Relative: 7.5 %
Neutro Abs: 2377 cells/uL (ref 1500–7800)
Neutrophils Relative %: 46.6 %
Platelets: 315 10*3/uL (ref 140–400)
RBC: 4.86 10*6/uL (ref 4.20–5.80)
RDW: 13.2 % (ref 11.0–15.0)
Total Lymphocyte: 37.1 %
WBC: 5.1 10*3/uL (ref 3.8–10.8)

## 2022-05-09 LAB — HEPATITIS C ANTIBODY: Hepatitis C Ab: NONREACTIVE

## 2022-05-09 LAB — LIPID PANEL
Cholesterol: 165 mg/dL (ref <200)
HDL: 52 mg/dL (ref >=40)
LDL Cholesterol (Calc): 94 mg/dL (calc)
Non-HDL Cholesterol (Calc): 113 mg/dL (calc) (ref <130)
Total CHOL/HDL Ratio: 3.2 (calc) (ref <5.0)
Triglycerides: 91 mg/dL (ref <150)

## 2022-05-09 LAB — RPR: RPR Ser Ql: NONREACTIVE

## 2022-05-09 LAB — HIV ANTIBODY (ROUTINE TESTING W REFLEX): HIV 1&2 Ab, 4th Generation: NONREACTIVE

## 2022-05-09 LAB — HEMOGLOBIN A1C
Hgb A1c MFr Bld: 6 % of total Hgb — ABNORMAL HIGH (ref <5.7)
Mean Plasma Glucose: 126 mg/dL
eAG (mmol/L): 7 mmol/L

## 2022-05-09 LAB — HEPATITIS B SURFACE ANTIGEN: Hepatitis B Surface Ag: NONREACTIVE

## 2022-05-10 LAB — C. TRACHOMATIS/N. GONORRHOEAE RNA
C. trachomatis RNA, TMA: NOT DETECTED
C. trachomatis RNA, TMA: NOT DETECTED
N. gonorrhoeae RNA, TMA: NOT DETECTED
N. gonorrhoeae RNA, TMA: NOT DETECTED

## 2022-05-18 ENCOUNTER — Encounter: Payer: Self-pay | Admitting: Pharmacist Clinician (PhC)/ Clinical Pharmacy Specialist

## 2022-05-18 ENCOUNTER — Ambulatory Visit: Payer: 59 | Attending: Cardiology | Admitting: Pharmacist Clinician (PhC)/ Clinical Pharmacy Specialist

## 2022-05-18 VITALS — BP 147/93 | HR 77

## 2022-05-18 DIAGNOSIS — I10 Essential (primary) hypertension: Secondary | ICD-10-CM

## 2022-05-18 MED ORDER — CHLORTHALIDONE 25 MG PO TABS: 25.0000 mg | ORAL_TABLET | Freq: Every day | ORAL | 3 refills | Status: DC

## 2022-05-18 NOTE — Progress Notes (Signed)
Office Visit    Patient Name: Zachary Cruz Date of Encounter: 05/18/2022  Primary Care Provider:  Samuel Bouche, NP Primary Cardiologist:  Nelva Bush, MD  Chief Complaint    Hypertension  Significant Past Medical History   HTN amlodipine 10 mg, losartan 25 mg BID  Afib carvedilol 12.5 mg BID  OSA CPAP    Allergies  Allergen Reactions   Amlodipine Other (See Comments)    Gingival hyperplasia    History of Present Illness    Zachary Cruz is a 42 year old male who presents to the hypertension clinic today after experiencing gum issues while on amlodipine. His last cardiology visit was in May with Dr. Harriet Masson and his BP was 130/82 on amlodipine 10 mg, losartan 25 mg BID, and carvedilol 12.5 mg BID. He recently started using an Invisalign for his teeth and his orthodontist mentioned that his gums were inflamed and it could be due to his medication. He is still taking amlodipine daily as well as his other medications. He was recently diagnosed with ulcerative colitis and has started taking mesalamine 1.2g (2 tabs daily).  At his visit with me in January we stopped the amlodipine and about 2 weeks later switched the losartan to olmesartan 40 mg daily.  He saw his PCP at the end of February, pressure was 145/85 so she recommended re-starting the amlodipine 10 mg daily.    Today he returns for follow up.    Blood Pressure Goal:  130/80  Current Medications: amlodipine 10 mg daily, olmesartan 40 mg qd, carvedilol 12.5 mg BID  Adherence Assessment: Do you ever forget to take your medication?  '[]'$ Yes '[x]'$ No  Do you ever skip doses due to side effects?  '[]'$ Yes '[x]'$ No  Do you have trouble affording your medicines? '[]'$ Yes '[]'$ No  Are you ever unable to pick up your medication due to transportation difficulties? '[]'$ Yes '[]'$ No  Do you ever stop taking your medications because you don't believe they are helping? '[]'$ Yes '[]'$ No  Do you check your weight daily? '[]'$ Yes '[]'$ No   Adherence: uses a  7 days pill pack   AM: carvedilol 12.'5mg'$ , losartan 25 mg, amlodipine 10 mg, mesalamine 1.2g PM: carvedilol 12.5 mg, losartan 25 mg  Previously tried: none  Family Hx: grandfather passed from a heart attack  Social Hx:     Tobacco: none Alcohol: none Caffeine: none  Diet: Mostly cooks at home due to colitis (fish, chicken, brussel sprouts, okra, sweet potatoes), adds salt to food, eats multiple little debbies a day and chips. Drinks mostly water but also has gatorade and pineapple juice.   Exercise: no regular exercise, usually walks and lifts weights once per week but it's not consistent.    Home BP readings: Does not check BP regularly at home; last home BP was 2 weeks ago because he wasn't feeling well and it was 130/90.  175/100 then 164/101 150/96 167/95 154/100  150/89  158/96 152/84 145/85   158/106  77 147/93  77  Accessory Clinical Findings    Lab Results  Component Value Date   CREATININE 1.07 05/08/2022   BUN 15 05/08/2022   NA 143 05/08/2022   K 3.6 05/08/2022   CL 106 05/08/2022   CO2 29 05/08/2022   Lab Results  Component Value Date   ALT 21 05/08/2022   AST 18 05/08/2022   ALKPHOS 75 11/23/2021   BILITOT 0.4 05/08/2022   Lab Results  Component Value Date   HGBA1C 6.0 (H) 05/08/2022  Home Medications    Current Outpatient Medications  Medication Sig Dispense Refill   carvedilol (COREG) 12.5 MG tablet Take 1 tablet (12.5 mg total) by mouth 2 (two) times daily with a meal. 180 tablet 3   cetirizine (ZYRTEC) 10 MG tablet Take 1 tablet (10 mg) by mouth once daily     chlorthalidone (HYGROTON) 25 MG tablet Take 1 tablet (25 mg total) by mouth daily. 90 tablet 3   Multiple Vitamins-Minerals (DAILY MULTIPLE VITAMINS/MIN PO) Take 1 tablet by mouth once daily     olmesartan (BENICAR) 40 MG tablet Take 1 tablet (40 mg total) by mouth daily. 30 tablet 6   No current facility-administered medications for this visit.    Home cuff - 158/106  77  Office  cuff -  147/93  77  Assessment & Plan    HTN (hypertension) Assessment: BP is uncontrolled in office BP 147/93 mmHg;  above the goal (<130/80). 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 Tolerates olmesartan well without any side effects Denies SOB, palpitation, chest pain, headaches,or swelling Prior abdominal CT showed no indication of adrenal masses Reiterated the importance of regular exercise and low salt diet   Plan:  Start taking chlorthalidone 25 mg once daily Continue taking olmesartan 40 mg qd,  carvedilol 12.5 mg bid Patient to keep record of BP readings with heart rate and report to [place] at the next visit Patient to follow up with PharmD in 6 weeks for follow up  Labs ordered today:  BMET in 2 weeks   Tommy Medal PharmD CPP [facility]  [address] [place], [place] [address] [phone]

## 2022-05-18 NOTE — Patient Instructions (Signed)
Follow up appointment: April 17 at 11 am  Go to the lab in 2 weeks to check kidney function and electrolytes  Take your BP meds as follows:  START CHLORTHALIDONE 25 MG ONCE DAILY  CONTINUE WITH ALL OTHER MEDICATIONS   Try Clarinex or Xyzal for allergies  Check your blood pressure at home daily (if able) and keep record of the readings.  Hypertension "High blood pressure"  Hypertension is often called "The Silent Killer." It rarely causes symptoms until it is extremely  high or has done damage to other organs in the body. For this reason, you should have your  blood pressure checked regularly by your physician. We will check your blood pressure  every time you see a provider at one of our offices.   Your blood pressure reading consists of two numbers. Ideally, blood pressure should be  below 120/80. The first ("top") number is called the systolic pressure. It measures the  pressure in your arteries as your heart beats. The second ("bottom") number is called the diastolic pressure. It measures the pressure in your arteries as the heart relaxes between beats.  The benefits of getting your blood pressure under control are enormous. A 10-point  reduction in systolic blood pressure can reduce your risk of stroke by 27% and heart failure by 28%  Your blood pressure goal is <130/80  To check your pressure at home you will need to:  1. Sit up in a chair, with feet flat on the floor and back supported. Do not cross your ankles or legs. 2. Rest your left arm so that the cuff is about heart level. If the cuff goes on your upper arm,  then just relax the arm on the table, arm of the chair or your lap. If you have a wrist cuff, we  suggest relaxing your wrist against your chest (think of it as Pledging the Flag with the  wrong arm).  3. Place the cuff snugly around your arm, about 1 inch above the crook of your elbow. The  cords should be inside the groove of your elbow.  4. Sit quietly,  with the cuff in place, for about 5 minutes. After that 5 minutes press the power  button to start a reading. 5. Do not talk or move while the reading is taking place.  6. Record your readings on a sheet of paper. Although most cuffs have a memory, it is often  easier to see a pattern developing when the numbers are all in front of you.  7. You can repeat the reading after 1-3 minutes if it is recommended  Make sure your bladder is empty and you have not had caffeine or tobacco within the last 30 min  Always bring your blood pressure log with you to your appointments. If you have not brought your monitor in to be double checked for accuracy, please bring it to your next appointment.  You can find a list of quality blood pressure cuffs at validatebp.org

## 2022-05-18 NOTE — Assessment & Plan Note (Signed)
Assessment: BP is uncontrolled in office BP 147/93 mmHg;  above the goal (<130/80). 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

## 2022-05-18 NOTE — Assessment & Plan Note (Signed)
>>  ASSESSMENT AND PLAN FOR HTN (HYPERTENSION) WRITTEN ON 05/18/2022 12:47 PM BY ALVSTAD, KRISTIN L, RPH-CPP  Assessment: BP is uncontrolled in office BP 147/93 mmHg;  above the goal (<130/80). 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

## 2022-06-02 LAB — BASIC METABOLIC PANEL
BUN/Creatinine Ratio: 16 (ref 9–20)
BUN: 18 mg/dL (ref 6–24)
CO2: 26 mmol/L (ref 20–29)
Calcium: 9 mg/dL (ref 8.7–10.2)
Chloride: 103 mmol/L (ref 96–106)
Creatinine, Ser: 1.13 mg/dL (ref 0.76–1.27)
Glucose: 98 mg/dL (ref 70–99)
Potassium: 3.4 mmol/L — ABNORMAL LOW (ref 3.5–5.2)
Sodium: 144 mmol/L (ref 134–144)
eGFR: 84 mL/min/{1.73_m2} (ref >59)

## 2022-06-16 ENCOUNTER — Other Ambulatory Visit: Payer: Self-pay

## 2022-06-16 DIAGNOSIS — I10 Essential (primary) hypertension: Secondary | ICD-10-CM

## 2022-06-20 ENCOUNTER — Other Ambulatory Visit: Payer: Self-pay | Admitting: Cardiology

## 2022-06-22 ENCOUNTER — Other Ambulatory Visit: Payer: Self-pay

## 2022-06-22 DIAGNOSIS — Z79899 Other long term (current) drug therapy: Secondary | ICD-10-CM

## 2022-06-22 NOTE — Progress Notes (Signed)
Updated orders to include Mag per Dr. Mallory Shirk preference.

## 2022-06-28 ENCOUNTER — Encounter: Payer: Self-pay | Admitting: Pharmacist Clinician (PhC)/ Clinical Pharmacy Specialist

## 2022-06-28 ENCOUNTER — Ambulatory Visit: Payer: 59 | Attending: Cardiovascular Disease | Admitting: Pharmacist Clinician (PhC)/ Clinical Pharmacy Specialist

## 2022-06-28 VITALS — BP 133/86 | HR 68

## 2022-06-28 DIAGNOSIS — I10 Essential (primary) hypertension: Secondary | ICD-10-CM

## 2022-06-28 NOTE — Assessment & Plan Note (Signed)
Assessment: BP is uncontrolled in office BP 133/86 mmHg;  above the goal (<130/80). Patient is making strong effort to adjust lifestyle - exercise, change in eating habits, weight loss Tolerates current medications well without any side effects Denies SOB, palpitation, chest pain, headaches,or swelling  Plan:  Continue taking current medications Patient to keep record of BP readings with heart rate and report to [place] at the next visit Patient to follow up with PharmD in 2 months  Labs ordered today:  renin/aldosterone ratio

## 2022-06-28 NOTE — Patient Instructions (Addendum)
Follow up appointment: Monday June 24 at 10:30 am  Go to the lab for renin/angiotensin labs - first thing in the morning  Take your BP meds as follows:  switch olmesartan or chlorthalidone to night to balance meds better  Check your blood pressure at home daily and keep record of the readings.  Hypertension "High blood pressure"  Hypertension is often called "The Silent Killer." It rarely causes symptoms until it is extremely  high or has done damage to other organs in the body. For this reason, you should have your  blood pressure checked regularly by your physician. We will check your blood pressure  every time you see a provider at one of our offices.   Your blood pressure reading consists of two numbers. Ideally, blood pressure should be  below 120/80. The first ("top") number is called the systolic pressure. It measures the  pressure in your arteries as your heart beats. The second ("bottom") number is called the diastolic pressure. It measures the pressure in your arteries as the heart relaxes between beats.  The benefits of getting your blood pressure under control are enormous. A 10-point  reduction in systolic blood pressure can reduce your risk of stroke by 27% and heart failure by 28%  Your blood pressure goal is 130/80  To check your pressure at home you will need to:  1. Sit up in a chair, with feet flat on the floor and back supported. Do not cross your ankles or legs. 2. Rest your left arm so that the cuff is about heart level. If the cuff goes on your upper arm,  then just relax the arm on the table, arm of the chair or your lap. If you have a wrist cuff, we  suggest relaxing your wrist against your chest (think of it as Pledging the Flag with the  wrong arm).  3. Place the cuff snugly around your arm, about 1 inch above the crook of your elbow. The  cords should be inside the groove of your elbow.  4. Sit quietly, with the cuff in place, for about 5 minutes.  After that 5 minutes press the power  button to start a reading. 5. Do not talk or move while the reading is taking place.  6. Record your readings on a sheet of paper. Although most cuffs have a memory, it is often  easier to see a pattern developing when the numbers are all in front of you.  7. You can repeat the reading after 1-3 minutes if it is recommended  Make sure your bladder is empty and you have not had caffeine or tobacco within the last 30 min  Always bring your blood pressure log with you to your appointments. If you have not brought your monitor in to be double checked for accuracy, please bring it to your next appointment.  You can find a list of quality blood pressure cuffs at validatebp.org

## 2022-06-28 NOTE — Progress Notes (Signed)
Office Visit    Patient Name: Zachary Cruz Date of Encounter: 06/28/2022  Primary Care Provider:  Christen Butter, NP Primary Cardiologist:  Yvonne Kendall, MD  Chief Complaint    Hypertension  Significant Past Medical History   HTN amlodipine 10 mg, losartan 25 mg BID  Afib carvedilol 12.5 mg BID  OSA CPAP    Allergies  Allergen Reactions   Amlodipine Other (See Comments)    Gingival hyperplasia    History of Present Illness    Zachary Cruz is a 42 year old male who presents to the hypertension clinic today after experiencing gum issues while on amlodipine. His last cardiology visit was in May with Dr. Servando Salina and his BP was 130/82 on amlodipine 10 mg, losartan 25 mg BID, and carvedilol 12.5 mg BID. He recently started using an Invisalign for his teeth and his orthodontist mentioned that his gums were inflamed and it could be due to his medication. He is still taking amlodipine daily as well as his other medications. He was recently diagnosed with ulcerative colitis and has started taking mesalamine 1.2g (2 tabs daily).  At his visit with me in January we stopped the amlodipine and about 2 weeks later switched the losartan to olmesartan 40 mg daily.  He saw his PCP at the end of February, pressure was 145/85 so she recommended re-starting the amlodipine 10 mg daily.    Today he returns for follow up.   At his last visit chlorthalidone 25 mg daily was added to his regimen.  BMET drawn later that month showed potassium dropped to 3.4, all other labs normal.  Since that visit he has been trying to get into a regular exercise habit, although he admits some weeks are better than others.  Has also cut back on snacking and trying to eat better.  As a result he notes being down about 10-12 pounds.    Gentian and n-acetyl cysteine  Lost 10 lb with dietary changes  133/86  Blood Pressure Goal:  130/80  Current Medications: olmesartan 40 mg qd, carvedilol 12.5 mg BID, chlorthalidone 25  mg qd  Adherence Assessment: Do you ever forget to take your medication?  [] Yes [x] No  Do you ever skip doses due to side effects?  [] Yes [x] No  Do you have trouble affording your medicines? [] Yes [] No  Are you ever unable to pick up your medication due to transportation difficulties? [] Yes [] No  Do you ever stop taking your medications because you don't believe they are helping? [] Yes [] No  Do you check your weight daily? [] Yes [] No   Adherence: uses a 7 days pill pack   AM: carvedilol 12.5mg , , amlodipine 10 mg, mesalamine 1.2g PM: carvedilol 12.5 mg,   Previously tried: none  Family Hx: grandfather passed from a heart attack  Social Hx:     Tobacco: none Alcohol: none Caffeine: none  Diet: Mostly cooks at home due to colitis (fish, chicken, brussel sprouts, okra, sweet potatoes), adds salt to food, eats multiple little debbies a day and chips. Drinks mostly water but also has gatorade and pineapple juice.  Cut back on snack foods   Exercise: no regular exercise, usually walks and lifts weights once per week but it's not consistent.    Home BP readings: home cuff found to be accurate within 10 points at prior visit.  No readings with him today  Accessory Clinical Findings    Lab Results  Component Value Date   CREATININE 1.13 06/01/2022   BUN  18 06/01/2022   NA 144 06/01/2022   K 3.4 (L) 06/01/2022   CL 103 06/01/2022   CO2 26 06/01/2022   Lab Results  Component Value Date   ALT 21 05/08/2022   AST 18 05/08/2022   ALKPHOS 75 11/23/2021   BILITOT 0.4 05/08/2022   Lab Results  Component Value Date   HGBA1C 6.0 (H) 05/08/2022    Home Medications    Current Outpatient Medications  Medication Sig Dispense Refill   carvedilol (COREG) 12.5 MG tablet TAKE 1 TABLET(12.5 MG) BY MOUTH TWICE DAILY WITH A MEAL 180 tablet 1   chlorthalidone (HYGROTON) 25 MG tablet Take 1 tablet (25 mg total) by mouth daily. 90 tablet 3   olmesartan (BENICAR) 40 MG tablet Take 1  tablet (40 mg total) by mouth daily. 30 tablet 6   No current facility-administered medications for this visit.    Screening for Secondary Hypertension:      06/28/2022   11:15 AM 06/28/2022    1:53 PM  Causes  Drugs/Herbals Screened Screened  Renovascular HTN  Not Screened  Sleep Apnea Screened Screened     - Comments CPAP daily   Thyroid Disease  Screened  Hyperaldosteronism  N/A  Pheochromocytoma  N/A  Cushing's Syndrome  N/A  Hyperparathyroidism  N/A  Compliance  Screened    Relevant Labs/Studies:    Latest Ref Rng & Units 06/01/2022    8:53 AM 05/08/2022   11:08 AM 11/23/2021   12:23 PM  Basic Labs  Sodium 134 - 144 mmol/L 144  143  141   Potassium 3.5 - 5.2 mmol/L 3.4  3.6  2.9   Creatinine 0.76 - 1.27 mg/dL 1.61  0.96  0.45        Latest Ref Rng & Units 07/29/2021   12:00 AM  Thyroid   TSH 0.40 - 4.50 mIU/L 1.06                 Assessment & Plan    Essential hypertension Assessment: BP is uncontrolled in office BP 133/86 mmHg;  above the goal (<130/80). Patient is making strong effort to adjust lifestyle - exercise, change in eating habits, weight loss Tolerates current medications well without any side effects Denies SOB, palpitation, chest pain, headaches,or swelling  Plan:  Continue taking current medications Patient to keep record of BP readings with heart rate and report to [place] at the next visit Patient to follow up with PharmD in 2 months  Labs ordered today:  renin/aldosterone ratio   Phillips Hay PharmD CPP [facility]  [address] [place], [place] [address] [phone]

## 2022-09-02 ENCOUNTER — Encounter: Payer: Self-pay | Admitting: Pharmacist Clinician (PhC)/ Clinical Pharmacy Specialist

## 2022-09-04 ENCOUNTER — Ambulatory Visit: Payer: 59

## 2022-09-04 NOTE — Progress Notes (Deleted)
Office Visit    Patient Name: Zachary Cruz Date of Encounter: 09/04/2022  Primary Care Provider:  Christen Butter, NP Primary Cardiologist:  Yvonne Kendall, MD  Chief Complaint    Hypertension  Significant Past Medical History   HTN amlodipine 10 mg, losartan 25 mg BID  Afib carvedilol 12.5 mg BID  OSA CPAP    Allergies  Allergen Reactions   Amlodipine Other (See Comments)    Gingival hyperplasia    History of Present Illness    Zachary Cruz is a 42 year old male who presents to the hypertension clinic today after experiencing gum issues while on amlodipine. His last cardiology visit was in May with Dr. Servando Salina and his BP was 130/82 on amlodipine 10 mg, losartan 25 mg BID, and carvedilol 12.5 mg BID. He recently started using an Invisalign for his teeth and his orthodontist mentioned that his gums were inflamed and it could be due to his medication. He is still taking amlodipine daily as well as his other medications. He was recently diagnosed with ulcerative colitis and has started taking mesalamine 1.2g (2 tabs daily).  At his visit with me in January we stopped the amlodipine and about 2 weeks later switched the losartan to olmesartan 40 mg daily.  He saw his PCP at the end of February, pressure was 145/85 so she recommended re-starting the amlodipine 10 mg daily.    Today he returns for follow up.   At his last visit chlorthalidone 25 mg daily was added to his regimen.  BMET drawn later that month showed potassium dropped to 3.4, all other labs normal.  Since that visit he has been trying to get into a regular exercise habit, although he admits some weeks are better than others.  Has also cut back on snacking and trying to eat better.  As a result he notes being down about 10-12 pounds.    Gentian and n-acetyl cysteine  Lost 10 lb with dietary changes  133/86  Blood Pressure Goal:  130/80  Current Medications: olmesartan 40 mg qd, carvedilol 12.5 mg BID, chlorthalidone 25  mg qd  Adherence Assessment: Do you ever forget to take your medication?  [] Yes [x] No  Do you ever skip doses due to side effects?  [] Yes [x] No  Do you have trouble affording your medicines? [] Yes [] No  Are you ever unable to pick up your medication due to transportation difficulties? [] Yes [] No  Do you ever stop taking your medications because you don't believe they are helping? [] Yes [] No  Do you check your weight daily? [] Yes [] No   Adherence: uses a 7 days pill pack   AM: carvedilol 12.5mg , , amlodipine 10 mg, mesalamine 1.2g PM: carvedilol 12.5 mg,   Previously tried: none  Family Hx: grandfather passed from a heart attack  Social Hx:     Tobacco: none Alcohol: none Caffeine: none  Diet: Mostly cooks at home due to colitis (fish, chicken, brussel sprouts, okra, sweet potatoes), adds salt to food, eats multiple little debbies a day and chips. Drinks mostly water but also has gatorade and pineapple juice.  Cut back on snack foods   Exercise: no regular exercise, usually walks and lifts weights once per week but it's not consistent.    Home BP readings: home cuff found to be accurate within 10 points at prior visit.  No readings with him today  Accessory Clinical Findings    Lab Results  Component Value Date   CREATININE 1.13 06/01/2022   BUN  18 06/01/2022   NA 144 06/01/2022   K 3.4 (L) 06/01/2022   CL 103 06/01/2022   CO2 26 06/01/2022   Lab Results  Component Value Date   ALT 21 05/08/2022   AST 18 05/08/2022   ALKPHOS 75 11/23/2021   BILITOT 0.4 05/08/2022   Lab Results  Component Value Date   HGBA1C 6.0 (H) 05/08/2022    Home Medications    Current Outpatient Medications  Medication Sig Dispense Refill   carvedilol (COREG) 12.5 MG tablet TAKE 1 TABLET(12.5 MG) BY MOUTH TWICE DAILY WITH A MEAL 180 tablet 1   chlorthalidone (HYGROTON) 25 MG tablet Take 1 tablet (25 mg total) by mouth daily. 90 tablet 3   olmesartan (BENICAR) 40 MG tablet Take 1  tablet (40 mg total) by mouth daily. 30 tablet 6   No current facility-administered medications for this visit.    Screening for Secondary Hypertension:      06/28/2022   11:15 AM 06/28/2022    1:53 PM  Causes  Drugs/Herbals Screened Screened  Renovascular HTN  Not Screened  Sleep Apnea Screened Screened     - Comments CPAP daily   Thyroid Disease  Screened  Hyperaldosteronism  N/A  Pheochromocytoma  N/A  Cushing's Syndrome  N/A  Hyperparathyroidism  N/A  Compliance  Screened    Relevant Labs/Studies:    Latest Ref Rng & Units 06/01/2022    8:53 AM 05/08/2022   11:08 AM 11/23/2021   12:23 PM  Basic Labs  Sodium 134 - 144 mmol/L 144  143  141   Potassium 3.5 - 5.2 mmol/L 3.4  3.6  2.9   Creatinine 0.76 - 1.27 mg/dL 4.09  8.11  9.14        Latest Ref Rng & Units 07/29/2021   12:00 AM  Thyroid   TSH 0.40 - 4.50 mIU/L 1.06                 Assessment & Plan    No problem-specific Assessment & Plan notes found for this encounter.   Phillips Hay PharmD CPP [facility]  [address] [place], [place] [address] [phone]

## 2022-09-15 ENCOUNTER — Ambulatory Visit: Payer: 59 | Attending: Cardiovascular Disease

## 2022-09-15 ENCOUNTER — Encounter: Payer: Self-pay | Admitting: Internal Medicine

## 2022-09-15 NOTE — Progress Notes (Deleted)
Office Visit    Patient Name: Zachary Cruz Date of Encounter: 09/15/2022  Primary Care Provider:  Christen Butter, NP Primary Cardiologist:  Yvonne Kendall, MD  Chief Complaint    Hypertension  Significant Past Medical History   HTN amlodipine 10 mg, losartan 25 mg BID  Afib carvedilol 12.5 mg BID  OSA CPAP    Allergies  Allergen Reactions   Amlodipine Other (See Comments)    Gingival hyperplasia    History of Present Illness    Zachary Cruz is a 42 year old male who presents to the hypertension clinic today after experiencing gum issues while on amlodipine. His last cardiology visit was in May with Dr. Servando Salina and his BP was 130/82 on amlodipine 10 mg, losartan 25 mg BID, and carvedilol 12.5 mg BID. He recently started using an Invisalign for his teeth and his orthodontist mentioned that his gums were inflamed and it could be due to his medication. He is still taking amlodipine daily as well as his other medications. He was recently diagnosed with ulcerative colitis and has started taking mesalamine 1.2g (2 tabs daily).  At his visit with me in January we stopped the amlodipine and about 2 weeks later switched the losartan to olmesartan 40 mg daily.  He saw his PCP at the end of February, pressure was 145/85 so she recommended re-starting the amlodipine 10 mg daily.  Most recently I saw him in April, he was down 10 pounds with lifestyle modifications, BP was close to goal and he was encouraged to continue.   Today he returns for follow up.     Blood Pressure Goal:  130/80  Current Medications: olmesartan 40 mg qd, carvedilol 12.5 mg BID, chlorthalidone 25 mg qd  Adherence Assessment: Do you ever forget to take your medication?  [] Yes [x] No  Do you ever skip doses due to side effects?  [] Yes [x] No  Do you have trouble affording your medicines? [] Yes [] No  Are you ever unable to pick up your medication due to transportation difficulties? [] Yes [] No  Do you ever stop  taking your medications because you don't believe they are helping? [] Yes [] No  Do you check your weight daily? [] Yes [] No   Adherence: uses a 7 days pill pack   AM: carvedilol 12.5mg , , amlodipine 10 mg, mesalamine 1.2g PM: carvedilol 12.5 mg,   Previously tried: none  Family Hx: grandfather passed from a heart attack  Social Hx:     Tobacco: none Alcohol: none Caffeine: none  Diet: Mostly cooks at home due to colitis (fish, chicken, brussel sprouts, okra, sweet potatoes), adds salt to food, eats multiple little debbies a day and chips. Drinks mostly water but also has gatorade and pineapple juice.  Cut back on snack foods   Exercise: no regular exercise, usually walks and lifts weights once per week but it's not consistent.    Home BP readings: home cuff found to be accurate within 10 points at prior visit.  No readings with him today  Accessory Clinical Findings    Lab Results  Component Value Date   CREATININE 1.13 06/01/2022   BUN 18 06/01/2022   NA 144 06/01/2022   K 3.4 (L) 06/01/2022   CL 103 06/01/2022   CO2 26 06/01/2022   Lab Results  Component Value Date   ALT 21 05/08/2022   AST 18 05/08/2022   ALKPHOS 75 11/23/2021   BILITOT 0.4 05/08/2022   Lab Results  Component Value Date   HGBA1C 6.0 (H)  05/08/2022    Home Medications    Current Outpatient Medications  Medication Sig Dispense Refill   carvedilol (COREG) 12.5 MG tablet TAKE 1 TABLET(12.5 MG) BY MOUTH TWICE DAILY WITH A MEAL 180 tablet 1   chlorthalidone (HYGROTON) 25 MG tablet Take 1 tablet (25 mg total) by mouth daily. 90 tablet 3   olmesartan (BENICAR) 40 MG tablet Take 1 tablet (40 mg total) by mouth daily. 30 tablet 6   No current facility-administered medications for this visit.    Screening for Secondary Hypertension:      06/28/2022   11:15 AM 06/28/2022    1:53 PM  Causes  Drugs/Herbals Screened Screened  Renovascular HTN  Not Screened  Sleep Apnea Screened Screened     -  Comments CPAP daily   Thyroid Disease  Screened  Hyperaldosteronism  N/A  Pheochromocytoma  N/A  Cushing's Syndrome  N/A  Hyperparathyroidism  N/A  Compliance  Screened    Relevant Labs/Studies:    Latest Ref Rng & Units 06/01/2022    8:53 AM 05/08/2022   11:08 AM 11/23/2021   12:23 PM  Basic Labs  Sodium 134 - 144 mmol/L 144  143  141   Potassium 3.5 - 5.2 mmol/L 3.4  3.6  2.9   Creatinine 0.76 - 1.27 mg/dL 5.46  5.68  1.27        Latest Ref Rng & Units 07/29/2021   12:00 AM  Thyroid   TSH 0.40 - 4.50 mIU/L 1.06                 Assessment & Plan    No problem-specific Assessment & Plan notes found for this encounter.   Phillips Hay PharmD CPP [facility]  [address] [place], [place] [address] [phone]

## 2022-10-06 ENCOUNTER — Other Ambulatory Visit: Payer: Self-pay

## 2022-10-06 MED ORDER — OLMESARTAN MEDOXOMIL 40 MG PO TABS: 40.0000 mg | ORAL_TABLET | Freq: Every day | ORAL | 0 refills | Status: DC

## 2022-10-19 ENCOUNTER — Encounter: Payer: Self-pay | Admitting: Medical-Surgical

## 2022-11-06 ENCOUNTER — Encounter: Payer: Self-pay | Admitting: Medical-Surgical

## 2022-11-06 ENCOUNTER — Ambulatory Visit: Payer: 59 | Admitting: Medical-Surgical

## 2022-11-06 VITALS — BP 127/78 | HR 58 | Ht 72.0 in | Wt 324.1 lb

## 2022-11-06 DIAGNOSIS — Z23 Encounter for immunization: Secondary | ICD-10-CM | POA: Diagnosis not present

## 2022-11-06 DIAGNOSIS — I10 Essential (primary) hypertension: Secondary | ICD-10-CM | POA: Diagnosis not present

## 2022-11-06 DIAGNOSIS — I480 Paroxysmal atrial fibrillation: Secondary | ICD-10-CM | POA: Diagnosis not present

## 2022-11-06 DIAGNOSIS — R7303 Prediabetes: Secondary | ICD-10-CM

## 2022-11-06 DIAGNOSIS — N522 Drug-induced erectile dysfunction: Secondary | ICD-10-CM

## 2022-11-06 DIAGNOSIS — F320 Major depressive disorder, single episode, mild: Secondary | ICD-10-CM

## 2022-11-06 DIAGNOSIS — G4733 Obstructive sleep apnea (adult) (pediatric): Secondary | ICD-10-CM

## 2022-11-06 LAB — POCT GLYCOSYLATED HEMOGLOBIN (HGB A1C): Hemoglobin A1C: 6.2 % — AB (ref 4.0–5.6)

## 2022-11-06 MED ORDER — SILDENAFIL CITRATE 20 MG PO TABS: 20.0000 mg | ORAL_TABLET | ORAL | 11 refills | Status: DC | PRN

## 2022-11-06 NOTE — Assessment & Plan Note (Signed)
History of depression with recent exacerbation due to the loss of his mother approximately 1 month ago.  Not currently on any medication.  Not doing any counseling.  Although struggling with grief, he feels that he is in an okay place mentally and is not interested in medications or referral for counseling at this time.  Denies SI/HI.  Monitor symptoms and if they worsen, advised him to reach out to discuss treatment options.

## 2022-11-06 NOTE — Addendum Note (Signed)
Addended by: Rae Halsted on: 11/06/2022 04:28 PM   Modules accepted: Orders

## 2022-11-06 NOTE — Progress Notes (Signed)
        Established patient visit  History, exam, impression, and plan:  OSA (obstructive sleep apnea) Pleasant 42 year old male presenting today with a history of sleep apnea.  He is currently using his CPAP nightly, tolerating well and is compliant with its use.  Feels that the Amada Jupiter is helpful and promotes good rest.  His machine was obtained in 2018 and he is good on supplies.  Continue CPAP use.  Depression, major, single episode, mild (HCC) History of depression with recent exacerbation due to the loss of his mother approximately 1 month ago.  Not currently on any medication.  Not doing any counseling.  Although struggling with grief, he feels that he is in an okay place mentally and is not interested in medications or referral for counseling at this time.  Denies SI/HI.  Monitor symptoms and if they worsen, advised him to reach out to discuss treatment options.  Atrial fibrillation (HCC) Managed by cardiology.  Compliant with medications.  HRR, S1/S2 normal today.  Lungs CTA with even and unlabored respirations.  Essential hypertension Managed by cardiology.  Compliant with medications and dosing.  Not regularly checking blood pressures at home.  Reading elevated on arrival however recheck was at goal.  Morbid obesity [facility]) Discussed recommendations for starting regular intentional exercise to help with overall health as well as prediabetes and hypertension.  Would like him to work on a lower calorie, high-protein diet with a goal for weight loss to a healthy weight.    Prediabetes History of prediabetes with last A1c at 6.0%.  Recheck today at 6.2%.  He has not been following any specific diets and is not regularly exercising.  Is aware of recommendations for a low carbohydrate diet and plans to start regular intentional exercise.   Procedures performed this visit: None.  Return in about 6 months (around 05/09/2023) for chronic disease follow  up.  __________________________________ Thayer Ohm, DNP, APRN, FNP-BC Primary Care and Sports Medicine [facility] [place]

## 2022-11-06 NOTE — Assessment & Plan Note (Signed)
Pleasant 42 year old male presenting today with a history of sleep apnea.  He is currently using his CPAP nightly, tolerating well and is compliant with its use.  Feels that the Amada Jupiter is helpful and promotes good rest.  His machine was obtained in 2018 and he is good on supplies.  Continue CPAP use.

## 2022-11-06 NOTE — Assessment & Plan Note (Signed)
Discussed recommendations for starting regular intentional exercise to help with overall health as well as prediabetes and hypertension.  Would like him to work on a lower calorie, high-protein diet with a goal for weight loss to a healthy weight.

## 2022-11-06 NOTE — Assessment & Plan Note (Signed)
Managed by cardiology.  Compliant with medications.  HRR, S1/S2 normal today.  Lungs CTA with even and unlabored respirations.

## 2022-11-06 NOTE — Assessment & Plan Note (Signed)
History of prediabetes with last A1c at 6.0%.  Recheck today at 6.2%.  He has not been following any specific diets and is not regularly exercising.  Is aware of recommendations for a low carbohydrate diet and plans to start regular intentional exercise.

## 2022-11-06 NOTE — Assessment & Plan Note (Signed)
Managed by cardiology.  Compliant with medications and dosing.  Not regularly checking blood pressures at home.  Reading elevated on arrival however recheck was at goal.

## 2022-12-02 ENCOUNTER — Other Ambulatory Visit: Payer: Self-pay | Admitting: Cardiology

## 2023-01-02 ENCOUNTER — Encounter: Payer: Self-pay | Admitting: Pharmacist Clinician (PhC)/ Clinical Pharmacy Specialist

## 2023-01-02 MED ORDER — NEBIVOLOL HCL 20 MG PO TABS: 20.0000 mg | ORAL_TABLET | Freq: Every day | ORAL | 1 refills | Status: DC

## 2023-01-29 ENCOUNTER — Ambulatory Visit: Payer: 59 | Attending: Cardiology | Admitting: Pharmacist Clinician (PhC)/ Clinical Pharmacy Specialist

## 2023-01-29 VITALS — BP 147/95 | HR 68

## 2023-01-29 DIAGNOSIS — I10 Essential (primary) hypertension: Secondary | ICD-10-CM | POA: Diagnosis not present

## 2023-01-29 NOTE — Patient Instructions (Signed)
Follow up appointment: Monday January 13 at 10:30 am  Take your BP meds as follows:  Continue with your current medications  Check your blood pressure at home twice daily at least 3-4 days per week and keep record of the readings.  Send me a MyChart message in about 2-3 weeks to let me know how the readings are looking.  Hypertension "High blood pressure"  Hypertension is often called "The Silent Killer." It rarely causes symptoms until it is extremely  high or has done damage to other organs in the body. For this reason, you should have your  blood pressure checked regularly by your physician. We will check your blood pressure  every time you see a provider at one of our offices.   Your blood pressure reading consists of two numbers. Ideally, blood pressure should be  below 120/80. The first ("top") number is called the systolic pressure. It measures the  pressure in your arteries as your heart beats. The second ("bottom") number is called the diastolic pressure. It measures the pressure in your arteries as the heart relaxes between beats.  The benefits of getting your blood pressure under control are enormous. A 10-point  reduction in systolic blood pressure can reduce your risk of stroke by 27% and heart failure by 28%  Your blood pressure goal is < 130/80  To check your pressure at home you will need to:  1. Sit up in a chair, with feet flat on the floor and back supported. Do not cross your ankles or legs. 2. Rest your left arm so that the cuff is about heart level. If the cuff goes on your upper arm,  then just relax the arm on the table, arm of the chair or your lap. If you have a wrist cuff, we  suggest relaxing your wrist against your chest (think of it as Pledging the Flag with the  wrong arm).  3. Place the cuff snugly around your arm, about 1 inch above the crook of your elbow. The  cords should be inside the groove of your elbow.  4. Sit quietly, with the cuff in  place, for about 5 minutes. After that 5 minutes press the power  button to start a reading. 5. Do not talk or move while the reading is taking place.  6. Record your readings on a sheet of paper. Although most cuffs have a memory, it is often  easier to see a pattern developing when the numbers are all in front of you.  7. You can repeat the reading after 1-3 minutes if it is recommended  Make sure your bladder is empty and you have not had caffeine or tobacco within the last 30 min  Always bring your blood pressure log with you to your appointments. If you have not brought your monitor in to be double checked for accuracy, please bring it to your next appointment.  You can find a list of quality blood pressure cuffs at validatebp.org

## 2023-01-29 NOTE — Progress Notes (Unsigned)
Office Visit    Patient Name: Zachary Cruz Date of Encounter: 01/30/2023  Primary Care Provider:  Christen Butter, NP Primary Cardiologist:  Yvonne Kendall, MD  Chief Complaint    Hypertension  Significant Past Medical History   HTN amlodipine 10 mg, losartan 25 mg BID  Afib carvedilol 12.5 mg BID  OSA CPAP    Allergies  Allergen Reactions   Amlodipine Other (See Comments)    Gingival hyperplasia    History of Present Illness    Zachary Cruz is a 42 year old male who presents to the hypertension clinic today after experiencing gum issues while on amlodipine. His last cardiology visit was in May with Dr. Servando Salina and his BP was 130/82 on amlodipine 10 mg, losartan 25 mg BID, and carvedilol 12.5 mg BID. He recently started using an Invisalign for his teeth and his orthodontist mentioned that his gums were inflamed and it could be due to his medication. He is still taking amlodipine daily as well as his other medications. He was recently diagnosed with ulcerative colitis and has started taking mesalamine 1.2g (2 tabs daily).  At his visit with me in January we stopped the amlodipine and about 2 weeks later switched the losartan to olmesartan 40 mg daily.  Chlorthalidone was later added.   Since then he continued to have problems with his gums and believed it was coming for the carvedilol.  This was switched to nebivolol.     Today he returns for follow up.   He admits that he has not been checking his blood pressure at all in the past month.  Has no concerns with his current medication regimen and no problems with compliance.    Blood Pressure Goal:  130/80  Current Medications: olmesartan 40 mg qd, nebivolol 20 mg qd, chlorthalidone 25 mg qd  Adherence Assessment: Do you ever forget to take your medication?  [] Yes [x] No  Do you ever skip doses due to side effects?  [] Yes [x] No  Do you have trouble affording your medicines? [] Yes [x] No  Are you ever unable to pick up your  medication due to transportation difficulties? [] Yes [x] No   Adherence: uses a 7 days pill pack    Previously tried: none  Family Hx: grandfather passed from a heart attack  Social Hx:     Tobacco: none Alcohol: none Caffeine: none  Diet: Mostly cooks at home due to colitis (fish, chicken, brussel sprouts, okra, sweet potatoes), adds salt to food, has tried to cut back on snack foods   Exercise: no regular exercise, usually walks and lifts weights once per week but it's not consistent.    Home BP readings: home cuff found to be accurate within 10 points at prior visit.  No readings with him today  Accessory Clinical Findings    Lab Results  Component Value Date   CREATININE 1.13 06/01/2022   BUN 18 06/01/2022   NA 144 06/01/2022   K 3.4 (L) 06/01/2022   CL 103 06/01/2022   CO2 26 06/01/2022   Lab Results  Component Value Date   ALT 21 05/08/2022   AST 18 05/08/2022   ALKPHOS 75 11/23/2021   BILITOT 0.4 05/08/2022   Lab Results  Component Value Date   HGBA1C 6.2 (A) 11/06/2022    Home Medications    Current Outpatient Medications  Medication Sig Dispense Refill   mesalamine (LIALDA) 1.2 g EC tablet Take 1.2 g by mouth daily with breakfast.     chlorthalidone (HYGROTON) 25  MG tablet Take 1 tablet (25 mg total) by mouth daily. 90 tablet 3   Nebivolol HCl 20 MG TABS Take 1 tablet (20 mg total) by mouth daily. 30 tablet 1   olmesartan (BENICAR) 40 MG tablet TAKE 1 TABLET(40 MG) BY MOUTH DAILY 90 tablet 1   No current facility-administered medications for this visit.    Screening for Secondary Hypertension:      06/28/2022   11:15 AM 06/28/2022    1:53 PM  Causes  Drugs/Herbals Screened Screened  Renovascular HTN  Not Screened  Sleep Apnea Screened Screened     - Comments CPAP daily   Thyroid Disease  Screened  Hyperaldosteronism  N/A  Pheochromocytoma  N/A  Cushing's Syndrome  N/A  Hyperparathyroidism  N/A  Compliance  Screened    Relevant  Labs/Studies:    Latest Ref Rng & Units 06/01/2022    8:53 AM 05/08/2022   11:08 AM 11/23/2021   12:23 PM  Basic Labs  Sodium 134 - 144 mmol/L 144  143  141   Potassium 3.5 - 5.2 mmol/L 3.4  3.6  2.9   Creatinine 0.76 - 1.27 mg/dL 1.88  4.16  6.06        Latest Ref Rng & Units 07/29/2021   12:00 AM  Thyroid   TSH 0.40 - 4.50 mIU/L 1.06                 Assessment & Plan    Essential hypertension Assessment: BP is uncontrolled in office BP 153/98 mmHg;  above the goal (<130/80). No home readings to compare Tolerates current medications well without any side effects Denies SOB, palpitation, chest pain, headaches,or swelling Reiterated the importance of regular exercise and low salt diet   Plan:  Continue taking olmesartan 40 mg, nebivolol 20 mg and chlorthalidone 25 mg - all once daily Patient to keep record of BP readings with heart rate and report to [place] in 2 weeks via MyChart Patient to follow up with PharmD in 2 months  Labs ordered today:  none   Phillips Hay PharmD CPP [facility]  [address] [place], [place] [address] [phone]

## 2023-01-30 NOTE — Assessment & Plan Note (Signed)
Assessment: BP is uncontrolled in office BP 153/98 mmHg;  above the goal (<130/80). No home readings to compare Tolerates current medications well without any side effects Denies SOB, palpitation, chest pain, headaches,or swelling Reiterated the importance of regular exercise and low salt diet   Plan:  Continue taking olmesartan 40 mg, nebivolol 20 mg and chlorthalidone 25 mg - all once daily Patient to keep record of BP readings with heart rate and report to [place] in 2 weeks via MyChart Patient to follow up with PharmD in 2 months  Labs ordered today:  none

## 2023-02-06 ENCOUNTER — Encounter: Payer: Self-pay | Admitting: Medical-Surgical

## 2023-02-06 ENCOUNTER — Encounter: Payer: Self-pay | Admitting: Pharmacist Clinician (PhC)/ Clinical Pharmacy Specialist

## 2023-02-06 ENCOUNTER — Encounter: Payer: Self-pay | Admitting: Internal Medicine

## 2023-02-07 MED ORDER — NEBIVOLOL HCL 20 MG PO TABS: 20.0000 mg | ORAL_TABLET | Freq: Every day | ORAL | 3 refills | Status: DC

## 2023-03-09 ENCOUNTER — Other Ambulatory Visit: Payer: Self-pay

## 2023-03-09 MED ORDER — OLMESARTAN MEDOXOMIL 40 MG PO TABS: 40.0000 mg | ORAL_TABLET | Freq: Every day | ORAL | 0 refills | Status: DC

## 2023-03-11 ENCOUNTER — Other Ambulatory Visit: Payer: Self-pay | Admitting: Cardiology

## 2023-03-11 DIAGNOSIS — I10 Essential (primary) hypertension: Secondary | ICD-10-CM

## 2023-03-26 ENCOUNTER — Ambulatory Visit: Payer: 59 | Attending: Cardiology

## 2023-03-26 NOTE — Progress Notes (Deleted)
 Patient ID: Zachary Cruz                 DOB: August 18, 1980                      MRN: [id number]     HPI: Zachary Cruz is a 43 y.o. male referred by Dr. PIERRETTE to HTN clinic. PMH is significant for  Current HTN meds:  Previously tried:  BP goal:   Family History:   Social History:   Diet:   Exercise:   Home BP readings:   Wt Readings from Last 3 Encounters:  11/06/22 (!) 324 lb 1.9 oz (147 kg)  05/08/22 (!) 325 lb 3.2 oz (147.5 kg)  12/16/21 (!) 319 lb (144.7 kg)   BP Readings from Last 3 Encounters:  01/29/23 (!) 147/95  11/06/22 127/78  06/28/22 133/86   Pulse Readings from Last 3 Encounters:  01/29/23 68  11/06/22 (!) 58  06/28/22 68    Renal function: CrCl cannot be calculated (Patient's most recent lab result is older than the maximum 21 days allowed.).  Past Medical History:  Diagnosis Date   Atrial fibrillation (HCC)    Childhood asthma    Hypertension    Sleep apnea     Current Outpatient Medications on File Prior to Visit  Medication Sig Dispense Refill   chlorthalidone  (HYGROTON ) 25 MG tablet TAKE 1 TABLET(25 MG) BY MOUTH DAILY 30 tablet 0   mesalamine  (LIALDA ) 1.2 g EC tablet Take 1.2 g by mouth daily with breakfast.     Nebivolol  HCl 20 MG TABS Take 1 tablet (20 mg total) by mouth daily. 90 tablet 3   olmesartan  (BENICAR ) 40 MG tablet Take 1 tablet (40 mg total) by mouth daily. 30 tablet 0   No current facility-administered medications on file prior to visit.    Allergies  Allergen Reactions   Amlodipine  Other (See Comments)    Gingival hyperplasia     Assessment/Plan:  1. Hypertension -

## 2023-04-06 ENCOUNTER — Other Ambulatory Visit: Payer: Self-pay | Admitting: Internal Medicine

## 2023-05-14 ENCOUNTER — Ambulatory Visit: Payer: 59 | Admitting: Medical-Surgical

## 2023-06-20 ENCOUNTER — Other Ambulatory Visit: Payer: Self-pay

## 2023-06-20 ENCOUNTER — Emergency Department (HOSPITAL_BASED_OUTPATIENT_CLINIC_OR_DEPARTMENT_OTHER)
Admission: EM | Admit: 2023-06-20 | Discharge: 2023-06-20 | Disposition: A | Discharge status: Home / Self Care | Attending: Emergency Medicine | Admitting: Emergency Medicine

## 2023-06-20 ENCOUNTER — Emergency Department (HOSPITAL_BASED_OUTPATIENT_CLINIC_OR_DEPARTMENT_OTHER): Admitting: Radiology

## 2023-06-20 DIAGNOSIS — R0602 Shortness of breath: Secondary | ICD-10-CM | POA: Diagnosis present

## 2023-06-20 DIAGNOSIS — Z79899 Other long term (current) drug therapy: Secondary | ICD-10-CM | POA: Insufficient documentation

## 2023-06-20 DIAGNOSIS — J181 Lobar pneumonia, unspecified organism: Secondary | ICD-10-CM | POA: Insufficient documentation

## 2023-06-20 DIAGNOSIS — J45909 Unspecified asthma, uncomplicated: Secondary | ICD-10-CM | POA: Insufficient documentation

## 2023-06-20 DIAGNOSIS — J189 Pneumonia, unspecified organism: Secondary | ICD-10-CM

## 2023-06-20 DIAGNOSIS — R059 Cough, unspecified: Secondary | ICD-10-CM | POA: Diagnosis not present

## 2023-06-20 DIAGNOSIS — I10 Essential (primary) hypertension: Secondary | ICD-10-CM | POA: Diagnosis not present

## 2023-06-20 LAB — BASIC METABOLIC PANEL WITH GFR
Anion gap: 12 (ref 5–15)
BUN: 13 mg/dL (ref 6–20)
CO2: 25 mmol/L (ref 22–32)
Calcium: 8.8 mg/dL — ABNORMAL LOW (ref 8.9–10.3)
Chloride: 102 mmol/L (ref 98–111)
Creatinine, Ser: 1.2 mg/dL (ref 0.61–1.24)
GFR, Estimated: >60 mL/min (ref >60)
Glucose, Bld: 99 mg/dL (ref 70–99)
Potassium: 3 mmol/L — ABNORMAL LOW (ref 3.5–5.1)
Sodium: 139 mmol/L (ref 135–145)

## 2023-06-20 LAB — CBC
HCT: 38.6 % — ABNORMAL LOW (ref 39.0–52.0)
Hemoglobin: 13.1 g/dL (ref 13.0–17.0)
MCH: 26.9 pg (ref 26.0–34.0)
MCHC: 33.9 g/dL (ref 30.0–36.0)
MCV: 79.3 fL — ABNORMAL LOW (ref 80.0–100.0)
Platelets: 298 10*3/uL (ref 150–400)
RBC: 4.87 MIL/uL (ref 4.22–5.81)
RDW: 12.6 % (ref 11.5–15.5)
WBC: 5.3 10*3/uL (ref 4.0–10.5)
nRBC: 0 % (ref 0.0–0.2)

## 2023-06-20 LAB — TROPONIN I (HIGH SENSITIVITY)
Troponin I (High Sensitivity): 14 ng/L (ref <18)
Troponin I (High Sensitivity): 17 ng/L (ref <18)

## 2023-06-20 LAB — RESP PANEL BY RT-PCR (RSV, FLU A&B, COVID)  RVPGX2
Influenza A by PCR: NEGATIVE
Influenza B by PCR: NEGATIVE
Resp Syncytial Virus by PCR: NEGATIVE
SARS Coronavirus 2 by RT PCR: NEGATIVE

## 2023-06-20 LAB — BRAIN NATRIURETIC PEPTIDE: B Natriuretic Peptide: 17 pg/mL (ref 0.0–100.0)

## 2023-06-20 MED ORDER — IPRATROPIUM-ALBUTEROL 0.5-2.5 (3) MG/3ML IN SOLN
3.0000 mL | Freq: Once | RESPIRATORY_TRACT | Status: AC
  Administered 2023-06-20: 3 mL via RESPIRATORY_TRACT
  Filled 2023-06-20: qty 3

## 2023-06-20 MED ORDER — AMOXICILLIN-POT CLAVULANATE 875-125 MG PO TABS: 1.0000 | ORAL_TABLET | Freq: Two times a day (BID) | ORAL | 0 refills | Status: DC

## 2023-06-20 MED ORDER — ALBUTEROL SULFATE HFA 108 (90 BASE) MCG/ACT IN AERS
1.0000 | INHALATION_SPRAY | Freq: Once | RESPIRATORY_TRACT | Status: AC
  Administered 2023-06-20: 1 via RESPIRATORY_TRACT
  Filled 2023-06-20: qty 6.7

## 2023-06-20 MED ORDER — AZITHROMYCIN 250 MG PO TABS: ORAL_TABLET | ORAL | 0 refills | Status: DC

## 2023-06-20 MED ORDER — BENZONATATE 100 MG PO CAPS: 100.0000 mg | ORAL_CAPSULE | Freq: Three times a day (TID) | ORAL | 0 refills | Status: DC

## 2023-06-20 MED ORDER — POTASSIUM CHLORIDE CRYS ER 20 MEQ PO TBCR
40.0000 meq | EXTENDED_RELEASE_TABLET | Freq: Once | ORAL | Status: AC
  Administered 2023-06-20: 40 meq via ORAL
  Filled 2023-06-20: qty 2

## 2023-06-20 NOTE — ED Triage Notes (Addendum)
 CP SOB intermittent since Saturday. +N/-V/-D. HX afib 2015-resolved spontaneously. HTN. Coughing. Denies swelling in legs.

## 2023-06-20 NOTE — Discharge Instructions (Addendum)
 You were seen today for community-acquired pneumonia.  I have prescribed you 2 medications to take For the next 5 days.  The azithromycin you are to take 2 tablets the first day and then 1 tablet every day for the next 4 days.  Augmentin you are to take twice a day every day for the next 5 days.  This can cause stomach upset be sure to ingest some yogurt or food with medications to help event stomach upset.  Diarrhea may be because this is a side effect of medication and not an allergy.  Will have you follow-up with PCP after medication to ensure that you get a repeat x-ray to ensure resolution as well as to do a redraw of your blood to evaluate your potassium.  Return to the ED for any new or worsening symptoms including worsening shortness of breath, chest pain despite medication, fever despite medication, leg swelling.

## 2023-06-20 NOTE — ED Provider Notes (Signed)
 [place] EMERGENCY DEPARTMENT AT [facility] Provider Note   CSN: [id number] Arrival date & time: 06/20/23  1755     History  Chief Complaint  Patient presents with   Chest Pain   Cough    Zachary Cruz is a 43 y.o. male.   Chest Pain Associated symptoms: cough and shortness of breath   Cough Associated symptoms: chest pain and shortness of breath   Patient is a 43 year old male presents the ED today with a 5-day history of intermittent fevers, persistent cough and congestion with increased shortness of breath.  Previous medical history of A-fib which resolved in 2015, HTN, OSA.  States that his son was recently ill with an upper respiratory infection.  Noted to have been taking Tylenol and ibuprofen but has last taken Tylenol this morning when he woke up.  Endorses generalized body aches, chest pain while coughing. Denies vision changes, neck stiffness, abdominal pain, nausea, vomiting, diarrhea, dysuria, melena, hematochezia, lower leg swelling.      Home Medications Prior to Admission medications   Medication Sig Start Date End Date Taking? Authorizing Provider  amoxicillin-clavulanate (AUGMENTIN) 875-125 MG tablet Take 1 tablet by mouth every 12 (twelve) hours. 06/20/23  Yes Hayes Lipps, PA-C  azithromycin (ZITHROMAX Z-PAK) 250 MG tablet Take 2 tablets the first day, then take 1 tablet daily afterward for the next 4 days. 06/20/23  Yes Hayes Lipps, PA-C  chlorthalidone (HYGROTON) 25 MG tablet TAKE 1 TABLET(25 MG) BY MOUTH DAILY 03/12/23   Avanell Leigh, MD  mesalamine (LIALDA) 1.2 g EC tablet Take 1.2 g by mouth daily with breakfast.    [provider]  Nebivolol HCl 20 MG TABS Take 1 tablet (20 mg total) by mouth daily. 02/07/23   End, Veryl Gottron, MD  olmesartan (BENICAR) 40 MG tablet TAKE 1 TABLET(40 MG) BY MOUTH DAILY 04/06/23   End, Veryl Gottron, MD      Allergies    Amlodipine    Review of Systems   Review of Systems   Respiratory:  Positive for cough and shortness of breath.   Cardiovascular:  Positive for chest pain.  All other systems reviewed and are negative.   Physical Exam Updated Vital Signs BP (!) 157/103 (BP Location: Left Arm)   Pulse 82   Temp 99.1 F (37.3 C) (Oral)   Resp 11   SpO2 98%  Physical Exam Vitals and nursing note reviewed.  Constitutional:      General: He is not in acute distress.    Appearance: Normal appearance. He is not ill-appearing.  HENT:     Head: Normocephalic and atraumatic.  Eyes:     Extraocular Movements: Extraocular movements intact.     Conjunctiva/sclera: Conjunctivae normal.  Neck:     Vascular: No JVD.     Trachea: No tracheal deviation.  Cardiovascular:     Rate and Rhythm: Normal rate and regular rhythm.     Pulses: Normal pulses.     Heart sounds: Normal heart sounds. No murmur heard.    No friction rub. No gallop.  Pulmonary:     Effort: Pulmonary effort is normal. No tachypnea, accessory muscle usage or respiratory distress.     Breath sounds: Wheezing and rales present. No decreased breath sounds or rhonchi.  Chest:     Chest wall: No mass, deformity, tenderness, crepitus or edema.  Abdominal:     General: Abdomen is flat.     Palpations: Abdomen is soft.     Tenderness: There is  no abdominal tenderness. There is no guarding.  Musculoskeletal:        General: Normal range of motion.     Right lower leg: No tenderness. No edema.     Left lower leg: No tenderness. No edema.  Skin:    General: Skin is warm and dry.     Findings: No ecchymosis or erythema.  Neurological:     General: No focal deficit present.     Mental Status: He is alert and oriented to person, place, and time. Mental status is at baseline.     Cranial Nerves: No cranial nerve deficit.  Psychiatric:        Mood and Affect: Mood normal. Mood is not anxious.     ED Results / Procedures / Treatments   Labs (all labs ordered are listed, but only abnormal results  are displayed) Labs Reviewed  BASIC METABOLIC PANEL WITH GFR - Abnormal; Notable for the following components:      Result Value   Potassium 3.0 (*)    Calcium 8.8 (*)    All other components within normal limits  CBC - Abnormal; Notable for the following components:   HCT 38.6 (*)    MCV 79.3 (*)    All other components within normal limits  RESP PANEL BY RT-PCR (RSV, FLU A&B, COVID)  RVPGX2  BRAIN NATRIURETIC PEPTIDE  TROPONIN I (HIGH SENSITIVITY)  TROPONIN I (HIGH SENSITIVITY)    EKG None  Radiology DG Chest 2 View Result Date: 06/20/2023 CLINICAL DATA:  Chest pain. EXAM: CHEST - 2 VIEW COMPARISON:  Chest radiograph dated 04/08/2020 FINDINGS: There is mild vascular congestion. Faint density in the right mid lung field may represent edema or developing infiltrate. No pleural effusion or pneumothorax. The cardiac silhouette is within limits. No acute osseous pathology. IMPRESSION: 1. Mild vascular congestion. 2. Faint density in the right mid lung field may represent edema or developing infiltrate. Electronically Signed   By: Angus Bark M.D.   On: 06/20/2023 18:55    Procedures Procedures    Medications Ordered in ED Medications  potassium chloride SA (KLOR-CON M) CR tablet 40 mEq (40 mEq Oral Given 06/20/23 2119)  ipratropium-albuterol (DUONEB) 0.5-2.5 (3) MG/3ML nebulizer solution 3 mL (3 mLs Nebulization Given 06/20/23 2126)    ED Course/ Medical Decision Making/ A&P Clinical Course as of 06/20/23 2159  Wed Jun 20, 2023  2052 Troponin I (High Sensitivity) [CB]    Clinical Course User Index [CB] Vevelyn Gowers                               [facility] Score: 0, PERC Score Interpretation: No need for further workup, as <2% chance of PE.  If no criteria are positive and clinicians pre-test probability is <15%, PERC Rule criteria are satisfied Medical Decision Making Amount and/or Complexity of Data Reviewed Labs: ordered. Radiology: ordered.   This patient is a  43 year old male who presents to the ED for concern of URI-like symptoms with shortness of breath and chest pain when coughing with shortness of breath being worse on exertion.    On physical exam, patient is in no acute distress, afebrile, alert and orient x 4, speaking in full sentences, nontachypneic, nontachycardic.  Suspect URI with possible pneumonia as some wheezing and rales are heard in the right lung and wheezing noted also on the left lung. No lower leg edema or abdominal tenderness to palpation, no CVA tenderness.  Unremarkable exam otherwise.  Chest x-ray noted some vascular congestion and possible developing consolidation in the right midlung.  With vascular dyspnea, BNP levels were taken so that if returning for future shortness of breath, despite not looking fluid overloaded currently they can have this as a baseline.  Will treat for CAP outpatient With a curb score of 0. patient provided DuoNebs and potassium chloride for hypokalemia noted on labs being 3.0.  Upon reevaluation, wheezing was noted to have improved.  Will provide Augmentin and azithromycin for him to take in the outpatient setting.  Will have him follow-up with PCP for repeat chest x-ray/redraw for his potassium and return to the ER for any new or worsening symptoms.  Low suspicion for any other emergent pathology present this time.  With all the labs showing no other abnormality.  Provided albuterol inhaler to take home.  Patient vital signs have remained stable throughout the course of patient's time in the ED. Low suspicion for any other emergent pathology at this time. I believe this patient is safe to be discharged. Provided strict return to ER precautions. Patient expressed agreement and understanding of plan. All questions were answered.  Attending was consulted who agreed with plan.  Differential diagnoses prior to evaluation: The emergent differential diagnosis includes, but is not limited to, URI, pneumonia,  ACS, PE,. This is not an exhaustive differential.   Past Medical History / Co-morbidities / Social History: OSA, A-fib, HTN, prediabetes, childhood asthma  Additional history: Chart reviewed. Pertinent results include:   Last cardiology visit was on 07/2022.  Currently taking olmesartan, chlorthalidone, nebivolol.   Lab Tests/Imaging studies: I personally interpreted labs/imaging and the pertinent results include:    CBC unremarkable BMP shows a hypokalemia of 3.0 and a mild hypocalcemia of 8.8. This is decreased from 1 year ago which was noted to 3.4 but patient has noted to have been 2.9 previously. BMP unremarkable Troponin 14, delta troponin unremarkable Chest x-ray notes mild vascular congestion and a faint density in the right midlung suggestive of edema or developing infiltrate I agree with the radiologist interpretation.  Cardiac monitoring: EKG obtained and interpreted by myself and attending physician which shows: NSR   Medications: I ordered medication including DuoNebs, Augmentin, aZithromycin.  I have reviewed the patients home medicines and have made adjustments as needed.  Social Determinants of Health:  Disposition: After consideration of the diagnostic results and the patients response to treatment, I feel that the patient would benefit from discharge and treatment as above.   emergency department workup does not suggest an emergent condition requiring admission or immediate intervention beyond what has been performed at this time. The plan is: Augmentin and azithromycin for CAP, follow-up with PCP in 1 week for repeat x-ray to evaluate for resolution of treatment, return for new or worsening symptoms. The patient is safe for discharge and has been instructed to return immediately for worsening symptoms, change in symptoms or any other concerns.  Final Clinical Impression(s) / ED Diagnoses Final diagnoses:  Community acquired pneumonia of right middle lobe of lung     Rx / DC Orders ED Discharge Orders          Ordered    amoxicillin-clavulanate (AUGMENTIN) 875-125 MG tablet  Every 12 hours        06/20/23 2144    azithromycin (ZITHROMAX Z-PAK) 250 MG tablet        06/20/23 2144              Daun Rens S,  PA-C 06/20/23 2207    Sallyanne Creamer, DO 06/25/23 1210

## 2023-06-20 NOTE — ED Notes (Signed)
 Patient provided with inhaler and spacer. Indications of use discussed with patient. Correct spacer usage demonstrated with patient. Patient able to perform independently.

## 2023-07-03 ENCOUNTER — Encounter: Payer: Self-pay | Admitting: Medical-Surgical

## 2023-07-04 ENCOUNTER — Ambulatory Visit (INDEPENDENT_AMBULATORY_CARE_PROVIDER_SITE_OTHER): Admitting: Medical-Surgical

## 2023-07-04 ENCOUNTER — Emergency Department (HOSPITAL_BASED_OUTPATIENT_CLINIC_OR_DEPARTMENT_OTHER)

## 2023-07-04 ENCOUNTER — Other Ambulatory Visit: Payer: Self-pay

## 2023-07-04 ENCOUNTER — Encounter: Payer: Self-pay | Admitting: Medical-Surgical

## 2023-07-04 ENCOUNTER — Observation Stay (HOSPITAL_BASED_OUTPATIENT_CLINIC_OR_DEPARTMENT_OTHER)
Admission: EM | Admit: 2023-07-04 | Discharge: 2023-07-06 | Disposition: A | Discharge status: Home / Self Care | Attending: Internal Medicine | Admitting: Internal Medicine

## 2023-07-04 ENCOUNTER — Encounter (HOSPITAL_BASED_OUTPATIENT_CLINIC_OR_DEPARTMENT_OTHER): Payer: Self-pay | Admitting: Emergency Medicine

## 2023-07-04 VITALS — BP 158/78 | HR 56 | Resp 20 | Ht 72.0 in | Wt 338.0 lb

## 2023-07-04 DIAGNOSIS — Z6841 Body Mass Index (BMI) 40.0 and over, adult: Secondary | ICD-10-CM | POA: Diagnosis not present

## 2023-07-04 DIAGNOSIS — Z79899 Other long term (current) drug therapy: Secondary | ICD-10-CM | POA: Insufficient documentation

## 2023-07-04 DIAGNOSIS — I10 Essential (primary) hypertension: Secondary | ICD-10-CM | POA: Insufficient documentation

## 2023-07-04 DIAGNOSIS — E876 Hypokalemia: Secondary | ICD-10-CM

## 2023-07-04 DIAGNOSIS — R7303 Prediabetes: Secondary | ICD-10-CM | POA: Insufficient documentation

## 2023-07-04 DIAGNOSIS — G4733 Obstructive sleep apnea (adult) (pediatric): Secondary | ICD-10-CM | POA: Insufficient documentation

## 2023-07-04 DIAGNOSIS — K649 Unspecified hemorrhoids: Secondary | ICD-10-CM | POA: Diagnosis not present

## 2023-07-04 DIAGNOSIS — R41840 Attention and concentration deficit: Secondary | ICD-10-CM

## 2023-07-04 DIAGNOSIS — D649 Anemia, unspecified: Secondary | ICD-10-CM | POA: Diagnosis not present

## 2023-07-04 DIAGNOSIS — J45909 Unspecified asthma, uncomplicated: Secondary | ICD-10-CM | POA: Diagnosis not present

## 2023-07-04 DIAGNOSIS — F320 Major depressive disorder, single episode, mild: Secondary | ICD-10-CM

## 2023-07-04 DIAGNOSIS — I4891 Unspecified atrial fibrillation: Secondary | ICD-10-CM | POA: Diagnosis not present

## 2023-07-04 DIAGNOSIS — R29810 Facial weakness: Secondary | ICD-10-CM | POA: Diagnosis present

## 2023-07-04 DIAGNOSIS — Z87891 Personal history of nicotine dependence: Secondary | ICD-10-CM | POA: Insufficient documentation

## 2023-07-04 DIAGNOSIS — Z09 Encounter for follow-up examination after completed treatment for conditions other than malignant neoplasm: Secondary | ICD-10-CM

## 2023-07-04 DIAGNOSIS — G459 Transient cerebral ischemic attack, unspecified: Secondary | ICD-10-CM | POA: Diagnosis not present

## 2023-07-04 DIAGNOSIS — R2990 Unspecified symptoms and signs involving the nervous system: Secondary | ICD-10-CM | POA: Diagnosis present

## 2023-07-04 DIAGNOSIS — I639 Cerebral infarction, unspecified: Secondary | ICD-10-CM

## 2023-07-04 LAB — COMPREHENSIVE METABOLIC PANEL WITH GFR
ALT: 39 U/L (ref 0–44)
AST: 29 U/L (ref 15–41)
Albumin: 4.2 g/dL (ref 3.5–5.0)
Alkaline Phosphatase: 80 U/L (ref 38–126)
Anion gap: 10 (ref 5–15)
BUN: 12 mg/dL (ref 6–20)
CO2: 27 mmol/L (ref 22–32)
Calcium: 9.2 mg/dL (ref 8.9–10.3)
Chloride: 102 mmol/L (ref 98–111)
Creatinine, Ser: 1.18 mg/dL (ref 0.61–1.24)
GFR, Estimated: >60 mL/min (ref >60)
Glucose, Bld: 93 mg/dL (ref 70–99)
Potassium: 3 mmol/L — ABNORMAL LOW (ref 3.5–5.1)
Sodium: 139 mmol/L (ref 135–145)
Total Bilirubin: 0.2 mg/dL (ref 0.0–1.2)
Total Protein: 7.1 g/dL (ref 6.5–8.1)

## 2023-07-04 LAB — CBC
HCT: 37.6 % — ABNORMAL LOW (ref 39.0–52.0)
Hemoglobin: 12.6 g/dL — ABNORMAL LOW (ref 13.0–17.0)
MCH: 27 pg (ref 26.0–34.0)
MCHC: 33.5 g/dL (ref 30.0–36.0)
MCV: 80.5 fL (ref 80.0–100.0)
Platelets: 354 10*3/uL (ref 150–400)
RBC: 4.67 MIL/uL (ref 4.22–5.81)
RDW: 13.1 % (ref 11.5–15.5)
WBC: 7.4 10*3/uL (ref 4.0–10.5)
nRBC: 0 % (ref 0.0–0.2)

## 2023-07-04 LAB — URINE DRUG SCREEN
Amphetamines: NOT DETECTED
Barbiturates: NOT DETECTED
Benzodiazepines: NOT DETECTED
Cocaine: NOT DETECTED
Fentanyl: NOT DETECTED
Methadone Scn, Ur: NOT DETECTED
Opiates: NOT DETECTED
Tetrahydrocannabinol: NOT DETECTED

## 2023-07-04 LAB — DIFFERENTIAL
Abs Immature Granulocytes: 0.02 10*3/uL (ref 0.00–0.07)
Basophils Absolute: 0 10*3/uL (ref 0.0–0.1)
Basophils Relative: 0 %
Eosinophils Absolute: 0.3 10*3/uL (ref 0.0–0.5)
Eosinophils Relative: 4 %
Immature Granulocytes: 0 %
Lymphocytes Relative: 36 %
Lymphs Abs: 2.7 10*3/uL (ref 0.7–4.0)
Monocytes Absolute: 0.6 10*3/uL (ref 0.1–1.0)
Monocytes Relative: 8 %
Neutro Abs: 3.7 10*3/uL (ref 1.7–7.7)
Neutrophils Relative %: 52 %

## 2023-07-04 LAB — CBG MONITORING, ED: Glucose-Capillary: 106 mg/dL — ABNORMAL HIGH (ref 70–99)

## 2023-07-04 LAB — PROTIME-INR
INR: 1 (ref 0.8–1.2)
Prothrombin Time: 13 s (ref 11.4–15.2)

## 2023-07-04 LAB — APTT: aPTT: 27 s (ref 24–36)

## 2023-07-04 LAB — ETHANOL: Alcohol, Ethyl (B): <15 mg/dL (ref <15)

## 2023-07-04 MED ORDER — POTASSIUM CHLORIDE CRYS ER 20 MEQ PO TBCR
40.0000 meq | EXTENDED_RELEASE_TABLET | Freq: Once | ORAL | Status: AC
  Administered 2023-07-05: 40 meq via ORAL
  Filled 2023-07-04: qty 2

## 2023-07-04 MED ORDER — IOHEXOL 350 MG/ML SOLN
75.0000 mL | Freq: Once | INTRAVENOUS | Status: AC | PRN
  Administered 2023-07-04: 75 mL via INTRAVENOUS

## 2023-07-04 MED ORDER — ASPIRIN 81 MG PO CHEW
324.0000 mg | CHEWABLE_TABLET | Freq: Once | ORAL | Status: AC
  Administered 2023-07-05: 324 mg via ORAL
  Filled 2023-07-04: qty 4

## 2023-07-04 MED ORDER — BUPROPION HCL ER (XL) 150 MG PO TB24: 150.0000 mg | ORAL_TABLET | Freq: Every day | ORAL | 1 refills | Status: DC

## 2023-07-04 MED ORDER — HYDROCORTISONE ACETATE 25 MG RE SUPP: 25.0000 mg | Freq: Two times a day (BID) | RECTAL | 11 refills | Status: DC | PRN

## 2023-07-04 NOTE — ED Triage Notes (Signed)
 Pt went to doctor today for check up. He left doc appt and felt "funky". LSN 11:15. Facial droop on R side. No other deficits. States he feels overall weakness. He states he struggled with blurry vision but isnt sure he was wearing glasses. EDP at bedside to assess.

## 2023-07-04 NOTE — ED Notes (Signed)
 Patient transported to CT

## 2023-07-04 NOTE — Progress Notes (Unsigned)
   Established patient visit  History, exam, impression, and plan:  No problem-specific Assessment & Plan notes found for this encounter.   ROS  Physical Exam  Procedures performed this visit: None.  No follow-ups on file.  __________________________________ Thayer Ohm, DNP, APRN, FNP-BC Primary Care and Sports Medicine [facility]

## 2023-07-04 NOTE — ED Provider Triage Note (Signed)
 Emergency Medicine Provider Triage Evaluation Note  Zachary Cruz , a 43 y.o. male  was evaluated in triage.  Pt complains of right-sided facial droop.  I was asked to come and evaluate the patient by nursing staff.  Patient reports that around 11:15 AM today he developed a right-sided facial droop after doctor's appointment.  Reports throughout the day he has developed a sensation of feeling generally weak.  Denies one-sided weakness or numbness.  Denies word finding difficulty, blurred vision.  He is reporting difficulty swallowing.  Denies chest pain or shortness of breath.  History of A-fib, does not take anticoagulation.  Patient outside of window.  ED stroke order set initiated, patient bedded at this time.  Review of Systems  Positive:  Negative:   Physical Exam  BP (!) 174/96 (BP Location: Right Arm)   Pulse 93   Temp 98.9 F (37.2 C) (Oral)   Resp 18   SpO2 99%  Gen:   Awake, no distress   Resp:  Normal effort  MSK:   Moves extremities without difficulty  Other:  No focal deficits on exam  Medical Decision Making  Medically screening exam initiated at 10:06 PM.  Appropriate orders placed.  Zachary Cruz was informed that the remainder of the evaluation will be completed by another provider, this initial triage assessment does not replace that evaluation, and the importance of remaining in the ED until their evaluation is complete.     Adel Aden, PA-C 07/04/23 2207

## 2023-07-05 ENCOUNTER — Encounter (HOSPITAL_BASED_OUTPATIENT_CLINIC_OR_DEPARTMENT_OTHER): Payer: Self-pay

## 2023-07-05 ENCOUNTER — Encounter: Payer: Self-pay | Admitting: Medical-Surgical

## 2023-07-05 DIAGNOSIS — G4733 Obstructive sleep apnea (adult) (pediatric): Secondary | ICD-10-CM

## 2023-07-05 DIAGNOSIS — E876 Hypokalemia: Secondary | ICD-10-CM

## 2023-07-05 DIAGNOSIS — D649 Anemia, unspecified: Secondary | ICD-10-CM

## 2023-07-05 DIAGNOSIS — I10 Essential (primary) hypertension: Secondary | ICD-10-CM

## 2023-07-05 DIAGNOSIS — R7303 Prediabetes: Secondary | ICD-10-CM

## 2023-07-05 DIAGNOSIS — Z79899 Other long term (current) drug therapy: Secondary | ICD-10-CM | POA: Diagnosis not present

## 2023-07-05 DIAGNOSIS — I4891 Unspecified atrial fibrillation: Secondary | ICD-10-CM

## 2023-07-05 DIAGNOSIS — R2990 Unspecified symptoms and signs involving the nervous system: Secondary | ICD-10-CM | POA: Diagnosis present

## 2023-07-05 DIAGNOSIS — R29810 Facial weakness: Secondary | ICD-10-CM | POA: Diagnosis present

## 2023-07-05 DIAGNOSIS — Z87891 Personal history of nicotine dependence: Secondary | ICD-10-CM | POA: Diagnosis not present

## 2023-07-05 DIAGNOSIS — J45909 Unspecified asthma, uncomplicated: Secondary | ICD-10-CM | POA: Diagnosis not present

## 2023-07-05 DIAGNOSIS — Z6841 Body Mass Index (BMI) 40.0 and over, adult: Secondary | ICD-10-CM | POA: Diagnosis not present

## 2023-07-05 DIAGNOSIS — G459 Transient cerebral ischemic attack, unspecified: Secondary | ICD-10-CM | POA: Diagnosis not present

## 2023-07-05 LAB — BASIC METABOLIC PANEL WITH GFR
BUN/Creatinine Ratio: 11 (ref 9–20)
BUN: 11 mg/dL (ref 6–24)
CO2: 23 mmol/L (ref 20–29)
Calcium: 8.9 mg/dL (ref 8.7–10.2)
Chloride: 102 mmol/L (ref 96–106)
Creatinine, Ser: 1.02 mg/dL (ref 0.76–1.27)
Glucose: 82 mg/dL (ref 70–99)
Potassium: 3.9 mmol/L (ref 3.5–5.2)
Sodium: 141 mmol/L (ref 134–144)
eGFR: 94 mL/min/{1.73_m2} (ref >59)

## 2023-07-05 MED ORDER — ACETAMINOPHEN 325 MG PO TABS: 650.0000 mg | ORAL_TABLET | Freq: Four times a day (QID) | ORAL | Status: DC | PRN

## 2023-07-05 MED ORDER — ASPIRIN 81 MG PO TBEC: 81.0000 mg | DELAYED_RELEASE_TABLET | Freq: Every day | ORAL | Status: DC

## 2023-07-05 MED ORDER — BUPROPION HCL ER (XL) 150 MG PO TB24
150.0000 mg | ORAL_TABLET | Freq: Every day | ORAL | Status: DC
  Administered 2023-07-06: 150 mg via ORAL
  Filled 2023-07-05: qty 1

## 2023-07-05 MED ORDER — ONDANSETRON HCL 4 MG/2ML IJ SOLN: 4.0000 mg | Freq: Four times a day (QID) | INTRAMUSCULAR | Status: DC | PRN

## 2023-07-05 MED ORDER — ONDANSETRON HCL 4 MG PO TABS: 4.0000 mg | ORAL_TABLET | Freq: Four times a day (QID) | ORAL | Status: DC | PRN

## 2023-07-05 MED ORDER — ACETAMINOPHEN 650 MG RE SUPP: 650.0000 mg | Freq: Four times a day (QID) | RECTAL | Status: DC | PRN

## 2023-07-05 MED ORDER — STROKE: EARLY STAGES OF RECOVERY BOOK
Freq: Once | Status: AC
  Filled 2023-07-05: qty 1

## 2023-07-05 MED ORDER — HYDROCODONE-ACETAMINOPHEN 5-325 MG PO TABS: 1.0000 | ORAL_TABLET | ORAL | Status: DC | PRN

## 2023-07-05 NOTE — ED Notes (Signed)
 Patient expressed no difficulty with reading eye chart during visual acuity. Pt stated he had no visual deficits and no blurriness in either eye. Pt wears glasses full time and they were kept on during visual acuity exam.

## 2023-07-05 NOTE — Plan of Care (Signed)
 Plan of Care Note for accepted transfer  Patient: Zachary Cruz    QQV:[id number]  DOA: 07/04/2023     Facility requesting transfer: [facility] ED  Requesting Provider: Tonya Fredrickson, MD  Reason for transfer: Stroke like symptoms  Facility course:   54 M with hx of Afib, HTN, Obesity, PreDM, OSA, depression, presented with R facial droop, dysarthria, dysphagia. Symptoms now resolved. LKWT 4/24 at 11AM. 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 EDP spoke with neurology Dr. Loise Rinks rec for MRI brain, TIA eval. Please consult neurology when he arrives at [facility]   Plan of care: The patient is accepted for admission to Telemetry unit, at [facility].   Author: Arnulfo Larch, MD  07/05/2023  Check [email or web address] for on-call coverage.  Nursing staff, Please call [facility] Admits & Consults System-Wide number on Amion as soon as patient's arrival, so appropriate admitting provider can evaluate the pt.

## 2023-07-05 NOTE — Assessment & Plan Note (Signed)
 Was short-lived patient not on anticoagulation will monitor on telemetry If has recurrent A-fib or evidence of neurological event this may change his score

## 2023-07-05 NOTE — Assessment & Plan Note (Signed)
 Somewhat unusual presentation which seems like more of a facial spasm and then trouble swallowing Given abnormal CTA MRI has been ordered.  Await results of neurology evaluation Correct electrolytes Continue daily aspirin  obtain lipid panel Echogram in a.m.

## 2023-07-05 NOTE — ED Notes (Signed)
 Food and drink provided, pt eating independently without requiring assistance or any complaints at present.

## 2023-07-05 NOTE — Assessment & Plan Note (Signed)
 Re-start CPAP

## 2023-07-05 NOTE — ED Notes (Signed)
 Assumed care of patient after report from existing shift, Jearlean Mince, Charity fundraiser

## 2023-07-05 NOTE — ED Provider Notes (Signed)
 [place] EMERGENCY DEPARTMENT AT [facility] Provider Note   CSN: [id number] Arrival date & time: 07/04/23  2158     History  Chief Complaint  Patient presents with   Facial Droop    Zachary Cruz is a 43 y.o. male.  The history is provided by the patient.  Cerebrovascular Accident This is a new problem. The current episode started 12 to 24 hours ago. The problem occurs constantly. The problem has been resolved. Pertinent negatives include no chest pain, no abdominal pain, no headaches and no shortness of breath. Nothing aggravates the symptoms. Nothing relieves the symptoms. He has tried nothing for the symptoms. The treatment provided significant relief.  Patient with AFIB with right facial drop, dysarthria and difficulty swallowing.  LSN 11 am.  At this time symptoms resolved.      Past Medical History:  Diagnosis Date   Atrial fibrillation (HCC)    Childhood asthma    Hypertension    Sleep apnea      Home Medications Prior to Admission medications   Medication Sig Start Date End Date Taking? Authorizing Provider  buPROPion  (WELLBUTRIN  XL) 150 MG 24 hr tablet Take 1 tablet (150 mg total) by mouth daily. 07/04/23   [name], NP  chlorthalidone  (HYGROTON ) 25 MG tablet TAKE 1 TABLET(25 MG) BY MOUTH DAILY 03/12/23   Avanell Leigh, MD  hydrocortisone  (ANUSOL -HC) 25 MG suppository Place 1 suppository (25 mg total) rectally 2 (two) times daily as needed for hemorrhoids. 07/04/23   Jessup, Joy, NP  mesalamine (LIALDA) 1.2 g EC tablet Take 1.2 g by mouth daily with breakfast.    [provider]  Nebivolol  HCl 20 MG TABS Take 1 tablet (20 mg total) by mouth daily. 02/07/23   End, Veryl Gottron, MD  olmesartan  (BENICAR ) 40 MG tablet TAKE 1 TABLET(40 MG) BY MOUTH DAILY 04/06/23   End, Veryl Gottron, MD      Allergies    Amlodipine     Review of Systems   Review of Systems  Constitutional:  Negative for fever.  HENT:  Negative for voice change.    Respiratory:  Negative for shortness of breath.   Cardiovascular:  Negative for chest pain.  Gastrointestinal:  Negative for abdominal pain.  Neurological:  Positive for facial asymmetry and speech difficulty. Negative for headaches.  All other systems reviewed and are negative.   Physical Exam Updated Vital Signs BP (!) 157/110   Pulse 66   Temp 98.9 F (37.2 C) (Oral)   Resp (!) 22   SpO2 99%  Physical Exam Vitals and nursing note reviewed.  Constitutional:      General: He is not in acute distress.    Appearance: Normal appearance. He is well-developed. He is not diaphoretic.  HENT:     Head: Normocephalic and atraumatic.     Nose: Nose normal.     Mouth/Throat:     Mouth: Mucous membranes are moist.     Pharynx: Oropharynx is clear.  Eyes:     Extraocular Movements: Extraocular movements intact.     Conjunctiva/sclera: Conjunctivae normal.     Pupils: Pupils are equal, round, and reactive to light.  Cardiovascular:     Rate and Rhythm: Normal rate and regular rhythm.     Pulses: Normal pulses.     Heart sounds: Normal heart sounds.  Pulmonary:     Effort: Pulmonary effort is normal.     Breath sounds: Normal breath sounds. No wheezing or rales.  Abdominal:     General:  Bowel sounds are normal.     Palpations: Abdomen is soft.     Tenderness: There is no abdominal tenderness. There is no guarding or rebound.  Musculoskeletal:        General: Normal range of motion.     Cervical back: Normal range of motion and neck supple.  Skin:    General: Skin is warm and dry.     Capillary Refill: Capillary refill takes less than 2 seconds.  Neurological:     General: No focal deficit present.     Mental Status: He is alert and oriented to person, place, and time.     Cranial Nerves: No cranial nerve deficit.     Deep Tendon Reflexes: Reflexes normal.  Psychiatric:        Mood and Affect: Mood normal.        Behavior: Behavior normal.     ED Results / Procedures /  Treatments   Labs (all labs ordered are listed, but only abnormal results are displayed) Results for orders placed or performed during the hospital encounter of 07/04/23  CBG monitoring, ED   Collection Time: 07/04/23 10:01 PM  Result Value Ref Range   Glucose-Capillary 106 (H) 70 - 99 mg/dL  Ethanol   Collection Time: 07/04/23 10:14 PM  Result Value Ref Range   Alcohol, Ethyl (B) <15 <15 mg/dL  Protime-INR   Collection Time: 07/04/23 10:14 PM  Result Value Ref Range   Prothrombin Time 13.0 11.4 - 15.2 seconds   INR 1.0 0.8 - 1.2  APTT   Collection Time: 07/04/23 10:14 PM  Result Value Ref Range   aPTT 27 24 - 36 seconds  CBC   Collection Time: 07/04/23 10:14 PM  Result Value Ref Range   WBC 7.4 4.0 - 10.5 K/uL   RBC 4.67 4.22 - 5.81 MIL/uL   Hemoglobin 12.6 (L) 13.0 - 17.0 g/dL   HCT 16.1 (L) 09.6 - 04.5 %   MCV 80.5 80.0 - 100.0 fL   MCH 27.0 26.0 - 34.0 pg   MCHC 33.5 30.0 - 36.0 g/dL   RDW 40.9 81.1 - 91.4 %   Platelets 354 150 - 400 K/uL   nRBC 0.0 0.0 - 0.2 %  Differential   Collection Time: 07/04/23 10:14 PM  Result Value Ref Range   Neutrophils Relative % 52 %   Neutro Abs 3.7 1.7 - 7.7 K/uL   Lymphocytes Relative 36 %   Lymphs Abs 2.7 0.7 - 4.0 K/uL   Monocytes Relative 8 %   Monocytes Absolute 0.6 0.1 - 1.0 K/uL   Eosinophils Relative 4 %   Eosinophils Absolute 0.3 0.0 - 0.5 K/uL   Basophils Relative 0 %   Basophils Absolute 0.0 0.0 - 0.1 K/uL   Immature Granulocytes 0 %   Abs Immature Granulocytes 0.02 0.00 - 0.07 K/uL  Comprehensive metabolic panel   Collection Time: 07/04/23 10:14 PM  Result Value Ref Range   Sodium 139 135 - 145 mmol/L   Potassium 3.0 (L) 3.5 - 5.1 mmol/L   Chloride 102 98 - 111 mmol/L   CO2 27 22 - 32 mmol/L   Glucose, Bld 93 70 - 99 mg/dL   BUN 12 6 - 20 mg/dL   Creatinine, Ser 7.82 0.61 - 1.24 mg/dL   Calcium  9.2 8.9 - 10.3 mg/dL   Total Protein 7.1 6.5 - 8.1 g/dL   Albumin 4.2 3.5 - 5.0 g/dL   AST 29 15 - 41 U/L   ALT  39  0 - 44 U/L   Alkaline Phosphatase 80 38 - 126 U/L   Total Bilirubin 0.2 0.0 - 1.2 mg/dL   GFR, Estimated >16 >10 mL/min   Anion gap 10 5 - 15  Rapid urine drug screen (hospital performed)   Collection Time: 07/04/23 10:14 PM  Result Value Ref Range   Opiates NONE DETECTED NONE DETECTED   Cocaine NONE DETECTED NONE DETECTED   Benzodiazepines NONE DETECTED NONE DETECTED   Amphetamines NONE DETECTED NONE DETECTED   Tetrahydrocannabinol NONE DETECTED NONE DETECTED   Barbiturates NONE DETECTED NONE DETECTED   Methadone Scn, Ur NONE DETECTED NONE DETECTED   Fentanyl  NONE DETECTED NONE DETECTED   CT ANGIO HEAD NECK W WO CM Result Date: 07/05/2023 CLINICAL DATA:  Acute neurologic deficit.  Right facial droop. EXAM: CT ANGIOGRAPHY HEAD AND NECK WITH AND WITHOUT CONTRAST TECHNIQUE: Multidetector CT imaging of the head and neck was performed using the standard protocol during bolus administration of intravenous contrast. Multiplanar CT image reconstructions and MIPs were obtained to evaluate the vascular anatomy. Carotid stenosis measurements (when applicable) are obtained utilizing NASCET criteria, using the distal internal carotid diameter as the denominator. RADIATION DOSE REDUCTION: This exam was performed according to the departmental dose-optimization program which includes automated exposure control, adjustment of the mA and/or kV according to patient size and/or use of iterative reconstruction technique. CONTRAST:  75mL OMNIPAQUE  IOHEXOL  350 MG/ML SOLN COMPARISON:  None Available. FINDINGS: CTA NECK FINDINGS Skeleton: No acute abnormality or high grade bony spinal canal stenosis. Other neck: Normal pharynx, larynx and major salivary glands. No cervical lymphadenopathy. Unremarkable thyroid  gland. Upper chest: No pneumothorax or pleural effusion. No nodules or masses. Aortic arch: There is no calcific atherosclerosis of the aortic arch. Normal variant aortic arch branching pattern with the  brachiocephalic and left common carotid arteries sharing a common origin. RIGHT carotid system: Normal without aneurysm, dissection or stenosis. LEFT carotid system: Normal without aneurysm, dissection or stenosis. Vertebral arteries: Left dominant configuration. There is no dissection, occlusion or flow-limiting stenosis to the skull base (V1-V3 segments). CTA HEAD FINDINGS POSTERIOR CIRCULATION: Vertebral arteries are normal. No proximal occlusion of the anterior or inferior cerebellar arteries. Basilar artery is normal. Superior cerebellar arteries are normal. 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 The left PCA is patent. ANTERIOR CIRCULATION: There is atherosclerotic calcification of the right carotid terminus with severe, near occlusive stenosis. Anterior cerebral arteries are normal. Middle cerebral arteries are normal. Venous sinuses: As permitted by contrast timing, patent. Anatomic variants: None Review of the MIP images confirms the above findings. IMPRESSION: 1. 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 2. 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 3. No hemodynamically significant stenosis of the carotid or vertebral arteries in the neck. Critical Value/emergent results were called by telephone at the time of interpretation on 07/05/2023 at 12:30 am to provider Yarlin Breisch , who verbally acknowledged these results. Electronically Signed   By: Juanetta Nordmann M.D.   On: 07/05/2023 00:30   CT HEAD WO CONTRAST Result Date: 07/04/2023 CLINICAL DATA:  Right-sided facial droop and altered mental status EXAM: CT HEAD WITHOUT CONTRAST TECHNIQUE: Contiguous axial images were obtained from the base of the skull through the vertex without intravenous contrast. RADIATION DOSE REDUCTION: This exam was performed according to the departmental dose-optimization program which includes automated exposure control, adjustment of the mA and/or kV according to patient size and/or use of iterative  reconstruction technique. COMPARISON:  None Available. FINDINGS: Brain: No mass,hemorrhage  or extra-axial collection. Normal appearance of the parenchyma and CSF spaces. Vascular: No hyperdense vessel or unexpected vascular calcification. Skull: The visualized skull base, calvarium and extracranial soft tissues are normal. Sinuses/Orbits: Ethmoid and maxillary sinus mucosal thickening. Normal orbits. Other: None. IMPRESSION: No acute intracranial abnormality. Electronically Signed   By: Juanetta Nordmann M.D.   On: 07/04/2023 23:06   DG Chest 2 View Result Date: 06/20/2023 CLINICAL DATA:  Chest pain. EXAM: CHEST - 2 VIEW COMPARISON:  Chest radiograph dated 04/08/2020 FINDINGS: There is mild vascular congestion. Faint density in the right mid lung field may represent edema or developing infiltrate. No pleural effusion or pneumothorax. The cardiac silhouette is within limits. No acute osseous pathology. IMPRESSION: 1. Mild vascular congestion. 2. Faint density in the right mid lung field may represent edema or developing infiltrate. Electronically Signed   By: Angus Bark M.D.   On: 06/20/2023 18:55     Radiology CT HEAD WO CONTRAST Result Date: 07/04/2023 CLINICAL DATA:  Right-sided facial droop and altered mental status EXAM: CT HEAD WITHOUT CONTRAST TECHNIQUE: Contiguous axial images were obtained from the base of the skull through the vertex without intravenous contrast. RADIATION DOSE REDUCTION: This exam was performed according to the departmental dose-optimization program which includes automated exposure control, adjustment of the mA and/or kV according to patient size and/or use of iterative reconstruction technique. COMPARISON:  None Available. FINDINGS: Brain: No mass,hemorrhage or extra-axial collection. Normal appearance of the parenchyma and CSF spaces. Vascular: No hyperdense vessel or unexpected vascular calcification. Skull: The visualized skull base, calvarium and extracranial soft tissues  are normal. Sinuses/Orbits: Ethmoid and maxillary sinus mucosal thickening. Normal orbits. Other: None. IMPRESSION: No acute intracranial abnormality. Electronically Signed   By: Juanetta Nordmann M.D.   On: 07/04/2023 23:06    Procedures .Critical Care  Performed by: Tonya Fredrickson, MD Authorized by: Tonya Fredrickson, MD   Critical care provider statement:    Critical care time (minutes):  30   Critical care end time:  07/05/2023 1:21 AM   Critical care was necessary to treat or prevent imminent or life-threatening deterioration of the following conditions:  CNS failure or compromise   Critical care was time spent personally by me on the following activities:  Development of treatment plan with patient or surrogate, discussions with consultants, evaluation of patient's response to treatment, examination of patient, ordering and review of laboratory studies, ordering and review of radiographic studies, ordering and performing treatments and interventions, pulse oximetry, re-evaluation of patient's condition and review of old charts   Care discussed with: admitting provider   Comments:     Case d/w Dr. Murvin Arthurs of neuro, admit to medicine      Medications Ordered in ED Medications  aspirin  chewable tablet 324 mg (has no administration in time range)  potassium chloride  SA (KLOR-CON  M) CR tablet 40 mEq (has no administration in time range)  iohexol  (OMNIPAQUE ) 350 MG/ML injection 75 mL (75 mLs Intravenous Contrast Given 07/04/23 2349)    ED Course/ Medical Decision Making/ A&P                                 Medical Decision Making Patient with slurred speech   Amount and/or Complexity of Data Reviewed External Data Reviewed: notes.    Details: Previous notes  Labs: ordered.    Details: UDS is negative.  Normal sodium 139, low potassium 3, normal creatinine and LFTS. Normal white count 7.4,  low hemoglobin 12.6, normal platelets, normal coags  Radiology: ordered and independent  interpretation performed.    Details: Negative head CT  Discussion of management or test interpretation with external provider(s): Case d/w Dr. Murvin Arthurs, CTA and admit to medicine   Risk OTC drugs. Prescription drug management. Decision regarding hospitalization.    Final Clinical Impression(s) / ED Diagnoses Final diagnoses:  TIA (transient ischemic attack)   The patient appears reasonably stabilized for admission considering the current resources, flow, and capabilities available in the ED at this time, and I doubt any other [facility] requiring further screening and/or treatment in the ED prior to admission.   Rx / DC Orders ED Discharge Orders     None         Dalvin Clipper, MD 07/05/23 1610

## 2023-07-05 NOTE — Assessment & Plan Note (Signed)
 Contributing to comorbidity and complicating medical management  Body mass index is 44.73 kg/m.  Nutritional follow up as an out pt would be recommended

## 2023-07-05 NOTE — ED Notes (Signed)
[facility] arrived to transport patient.  

## 2023-07-05 NOTE — Assessment & Plan Note (Signed)
 Allow permissive hypertension

## 2023-07-05 NOTE — H&P (Signed)
 Zachary Cruz ZOX:[id number] DOB: 02-18-1981 DOA: 07/04/2023     PCP: [name], NP   Outpatient Specialists:  CARDS:  Dr. Sammy Crisp, MD    Patient arrived to ER on 07/04/23 at 2158 Referred by Attending Arnulfo Larch, MD   Patient coming from:    home Lives   With family     Chief Complaint:   Chief Complaint  Patient presents with   Facial Droop    HPI: Zachary Cruz is a 43 y.o. male with medical history significant of A-fib, hypertension obesity sleep apnea depression    Presented with right patient droop dysarthria dysphagia and now resolved reports when he would smile his face would get stuck like that He was very fatigued When he tried to eat he could not swallow And that scared him this happened yesterday Patient presents with facial droop to [facility],  Facial droop have started between 12 to 24 hours prior to presentation.  Denies any chest pain abdominal pain headaches no shortness of breath He has known history of atrial fibrillation also reports dysarthria and difficulty swallowing initially but now resolved last known well was 11 AM. Yesterday No tPA given patient is outside of window CT head negative CTA showing severe R carotid stenosis + R PCA occlusion near origin (not in contributory territory).   Denies significant ETOH intake   Does not smoke   Denies marijuana use      Regarding pertinent Chronic problems:      HTN on Hygroton , Nebivolol  Benicar        Morbid obesity-   BMI Readings from Last 1 Encounters:  07/05/23 44.73 kg/m       OSA -on nocturnal  CPAP     A. Fib -   atrial fibrillation was short lived  CHA2DS2 vas score    3  Not on anticoagulation      While in ER:   CT head unremarkable but CTA does show severe right carotid stenosis with right PCA occlusion the origin not explaining patient's symptoms though    Lab Orders         Ethanol         Protime-INR         APTT         CBC          Differential         Comprehensive metabolic panel         Rapid urine drug screen (hospital performed)         CBG monitoring, ED      CT HEAD *** NON acute   MRI brain  ***no acute CVA  CXR - ***NON acute  CTabd/pelvis - ***nonacute  CTA chest - ***nonacute, no PE, * no evidence of infiltrate  Following Medications were ordered in ER: Medications  aspirin  chewable tablet 324 mg (324 mg Oral Given 07/05/23 0027)  iohexol  (OMNIPAQUE ) 350 MG/ML injection 75 mL (75 mLs Intravenous Contrast Given 07/04/23 2349)  potassium chloride  SA (KLOR-CON  M) CR tablet 40 mEq (40 mEq Oral Given 07/05/23 0026)    _______________________________________________________ ER Provider Called:       DrAaron Aas  They Recommend admit to medicine *** Will see in AM  ***SEEN in ER     ED Triage Vitals  Encounter Vitals Group     BP 07/04/23 2205 (!) 174/96     Systolic BP Percentile --      Diastolic BP Percentile --  Pulse Rate 07/04/23 2205 93     Resp 07/04/23 2205 18     Temp 07/04/23 2205 98.9 F (37.2 C)     Temp Source 07/04/23 2205 Oral     SpO2 07/04/23 2205 99 %     Weight 07/05/23 1524 (!) 339 lb (153.8 kg)     Height 07/05/23 1524 6\' 1"  (1.854 m)     Head Circumference --      Peak Flow --      Pain Score 07/04/23 2205 0     Pain Loc --      Pain Education --      Exclude from Growth Chart --   ZOXW(96)@     _________________________________________ Significant initial  Findings: Abnormal Labs Reviewed  CBC - Abnormal; Notable for the following components:      Result Value   Hemoglobin 12.6 (*)    HCT 37.6 (*)    All other components within normal limits  COMPREHENSIVE METABOLIC PANEL WITH GFR - Abnormal; Notable for the following components:   Potassium 3.0 (*)    All other components within normal limits  CBG MONITORING, ED - Abnormal; Notable for the following components:   Glucose-Capillary 106 (*)    All other components within normal limits       _________________________ Troponin ***ordered Cardiac Panel (last 3 results) No results for input(s): "CKTOTAL", "CKMB", "TROPONINIHS", "RELINDX" in the last 72 hours.   ECG: Ordered Personally reviewed and interpreted by me showing: HR : *** Rhythm: *NSR, Sinus tachycardia * A.fib. W RVR, RBBB, LBBB, Paced Ischemic changes*nonspecific changes, no evidence of ischemic changes QTC*  BNP (last 3 results) Recent Labs    06/20/23 1804  BNP 17.0      No results for input(s): "DDIMER", "FERRITIN", "LDH", "CRP" in the last 72 hours.     The recent clinical data is shown below. Vitals:   07/05/23 1902 07/05/23 2000 07/05/23 2052 07/05/23 2243  BP: (!) 158/102  (!) 146/93   Pulse: 64 71 63 61  Resp: (!) 22 17 20 18   Temp:    97.7 F (36.5 C)  TempSrc:    Oral  SpO2: 99% 98% 99% 96%  Weight:      Height:        WBC     Component Value Date/Time   WBC 7.4 07/04/2023 2214   LYMPHSABS 2.7 07/04/2023 2214   MONOABS 0.6 07/04/2023 2214   EOSABS 0.3 07/04/2023 2214   BASOSABS 0.0 07/04/2023 2214          UA *** no evidence of UTI  ***Pending ***not ordered   Urine analysis:    Component Value Date/Time   COLORURINE STRAW (A) 10/24/2016 2338   APPEARANCEUR CLEAR (A) 10/24/2016 2338   LABSPEC 1.003 (L) 10/24/2016 2338   PHURINE 6.0 10/24/2016 2338   GLUCOSEU NEGATIVE 10/24/2016 2338   HGBUR NEGATIVE 10/24/2016 2338   BILIRUBINUR NEGATIVE 10/24/2016 2338   KETONESUR NEGATIVE 10/24/2016 2338   PROTEINUR NEGATIVE 10/24/2016 2338   NITRITE NEGATIVE 10/24/2016 2338   LEUKOCYTESUR NEGATIVE 10/24/2016 2338    ________________________________________________________________  Arterial ***Venous  Blood Gas result:  pH *** pCO2 ***; pO2 ***;     %O2 Sat ***.   __________________________________________________________ Recent Labs  Lab 07/04/23 1249 07/04/23 2214  NA 141 139  K 3.9 3.0*  CO2 23 27  GLUCOSE 82 93  BUN 11 12  CREATININE 1.02 1.18  CALCIUM  8.9 9.2     Cr   stable,  Lab Results  Component Value Date   CREATININE 1.18 07/04/2023   CREATININE 1.02 07/04/2023   CREATININE 1.20 06/20/2023    Recent Labs  Lab 07/04/23 2214  AST 29  ALT 39  ALKPHOS 80  BILITOT 0.2  PROT 7.1  ALBUMIN 4.2   Lab Results  Component Value Date   CALCIUM  9.2 07/04/2023    Plt: Lab Results  Component Value Date   PLT 354 07/04/2023         Recent Labs  Lab 07/04/23 2214  WBC 7.4  NEUTROABS 3.7  HGB 12.6*  HCT 37.6*  MCV 80.5  PLT 354    HG/HCT  stable,     Component Value Date/Time   HGB 12.6 (L) 07/04/2023 2214   HCT 37.6 (L) 07/04/2023 2214   MCV 80.5 07/04/2023 2214     No results for input(s): "LIPASE", "AMYLASE" in the last 168 hours. No results for input(s): "AMMONIA" in the last 168 hours.    _______________________________________________ Hospitalist was called for admission for   TIA    The following Work up has been ordered so far:  Orders Placed This Encounter  Procedures   Critical Care   CT HEAD WO CONTRAST   CT ANGIO HEAD NECK W WO CM   Ethanol   Protime-INR   APTT   CBC   Differential   Comprehensive metabolic panel   Rapid urine drug screen (hospital performed)   Diet NPO time specified   Vital signs   ED Cardiac monitoring   NIH Stroke Scale   Swallow screen   Initiate Carrier Fluid Protocol   If O2 sat If O2 Sat < 94%, administer O2 at 2 liters/minute via nasal cannula.   Cardiac Monitoring - Continuous Indefinite   Consult to Neuro Hospitalist   Consult to hospitalist   CBG monitoring, ED   ED EKG   Saline lock IV   Place in observation (patient's expected length of stay will be less than 2 midnights)     OTHER Significant initial  Findings:  labs showing:     DM  labs:  HbA1C: Recent Labs    11/06/22 1043  HGBA1C 6.2*       CBG (last 3)  Recent Labs    07/04/23 2201  GLUCAP 106*          Cultures: No results found for: "SDES", "SPECREQUEST", "CULT",  "REPTSTATUS"   Radiological Exams on Admission: CT ANGIO HEAD NECK W WO CM Result Date: 07/05/2023 CLINICAL DATA:  Acute neurologic deficit.  Right facial droop. EXAM: CT ANGIOGRAPHY HEAD AND NECK WITH AND WITHOUT CONTRAST TECHNIQUE: Multidetector CT imaging of the head and neck was performed using the standard protocol during bolus administration of intravenous contrast. Multiplanar CT image reconstructions and MIPs were obtained to evaluate the vascular anatomy. Carotid stenosis measurements (when applicable) are obtained utilizing NASCET criteria, using the distal internal carotid diameter as the denominator. RADIATION DOSE REDUCTION: This exam was performed according to the departmental dose-optimization program which includes automated exposure control, adjustment of the mA and/or kV according to patient size and/or use of iterative reconstruction technique. CONTRAST:  75mL OMNIPAQUE  IOHEXOL  350 MG/ML SOLN COMPARISON:  None Available. FINDINGS: CTA NECK FINDINGS Skeleton: No acute abnormality or high grade bony spinal canal stenosis. Other neck: Normal pharynx, larynx and major salivary glands. No cervical lymphadenopathy. Unremarkable thyroid  gland. Upper chest: No pneumothorax or pleural effusion. No nodules or masses. Aortic arch: There is no calcific atherosclerosis of the aortic arch. Normal variant  aortic arch branching pattern with the brachiocephalic and left common carotid arteries sharing a common origin. RIGHT carotid system: Normal without aneurysm, dissection or stenosis. LEFT carotid system: Normal without aneurysm, dissection or stenosis. Vertebral arteries: Left dominant configuration. There is no dissection, occlusion or flow-limiting stenosis to the skull base (V1-V3 segments). CTA HEAD FINDINGS POSTERIOR CIRCULATION: Vertebral arteries are normal. No proximal occlusion of the anterior or inferior cerebellar arteries. Basilar artery is normal. Superior cerebellar arteries are normal. The  right PCA is occluded near its origin. The left PCA is patent. ANTERIOR CIRCULATION: There is atherosclerotic calcification of the right carotid terminus with severe, near occlusive stenosis. Anterior cerebral arteries are normal. Middle cerebral arteries are normal. Venous sinuses: As permitted by contrast timing, patent. Anatomic variants: None Review of the MIP images confirms the above findings. IMPRESSION: 1. 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 2. 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 3. No hemodynamically significant stenosis of the carotid or vertebral arteries in the neck. Critical Value/emergent results were called by telephone at the time of interpretation on 07/05/2023 at 12:30 am to provider APRIL PALUMBO , who verbally acknowledged these results. Electronically Signed   By: Juanetta Nordmann M.D.   On: 07/05/2023 00:30   CT HEAD WO CONTRAST Result Date: 07/04/2023 CLINICAL DATA:  Right-sided facial droop and altered mental status EXAM: CT HEAD WITHOUT CONTRAST TECHNIQUE: Contiguous axial images were obtained from the base of the skull through the vertex without intravenous contrast. RADIATION DOSE REDUCTION: This exam was performed according to the departmental dose-optimization program which includes automated exposure control, adjustment of the mA and/or kV according to patient size and/or use of iterative reconstruction technique. COMPARISON:  None Available. FINDINGS: Brain: No mass,hemorrhage or extra-axial collection. Normal appearance of the parenchyma and CSF spaces. Vascular: No hyperdense vessel or unexpected vascular calcification. Skull: The visualized skull base, calvarium and extracranial soft tissues are normal. Sinuses/Orbits: Ethmoid and maxillary sinus mucosal thickening. Normal orbits. Other: None. IMPRESSION: No acute intracranial abnormality. Electronically Signed   By: Juanetta Nordmann M.D.   On: 07/04/2023 23:06    _______________________________________________________________________________________________________ Latest  Blood pressure (!) 146/93, pulse 61, temperature 97.7 F (36.5 C), temperature source Oral, resp. rate 18, height 6\' 1"  (1.854 m), weight (!) 153.8 kg, SpO2 96%.   Vitals  labs and radiology finding personally reviewed  Review of Systems:    Pertinent positives include: ***  Constitutional:  No weight loss, night sweats, Fevers, chills, fatigue, weight loss  HEENT:  No headaches, Difficulty swallowing,Tooth/dental problems,Sore throat,  No sneezing, itching, ear ache, nasal congestion, post nasal drip,  Cardio-vascular:  No chest pain, Orthopnea, PND, anasarca, dizziness, palpitations.no Bilateral lower extremity swelling  GI:  No heartburn, indigestion, abdominal pain, nausea, vomiting, diarrhea, change in bowel habits, loss of appetite, melena, blood in stool, hematemesis Resp:  no shortness of breath at rest. No dyspnea on exertion, No excess mucus, no productive cough, No non-productive cough, No coughing up of blood.No change in color of mucus.No wheezing. Skin:  no rash or lesions. No jaundice GU:  no dysuria, change in color of urine, no urgency or frequency. No straining to urinate.  No flank pain.  Musculoskeletal:  No joint pain or no joint swelling. No decreased range of motion. No back pain.  Psych:  No change in mood or affect. No depression or anxiety. No memory loss.  Neuro: no localizing neurological complaints, no tingling, no weakness, no double vision, no gait abnormality, no slurred  speech, no confusion  All systems reviewed and apart from HOPI all are negative _______________________________________________________________________________________________ Past Medical History:   Past Medical History:  Diagnosis Date   Atrial fibrillation (HCC)    Childhood asthma    Hypertension    Sleep apnea       Past Surgical History:  Procedure  Laterality Date   CHOLECYSTECTOMY N/A 11/08/2016   Procedure: LAPAROSCOPIC CHOLECYSTECTOMY;  Surgeon: Claudia Cuff, MD;  Location: [facility] ORS;  Service: General;  Laterality: N/A;   TONSILLECTOMY      Social History:  Ambulatory *** independently cane, walker  wheelchair bound, bed bound     reports that he quit smoking about 20 years ago. His smoking use included cigarettes. He started smoking about 27 years ago. He has a 0.7 pack-year smoking history. He has never used smokeless tobacco. He reports that he does not currently use drugs. He reports that he does not drink alcohol.     Family History: *** Family History  Problem Relation Age of Onset   Diabetes Father    Mental illness Father    Hypertension Maternal Grandfather    ______________________________________________________________________________________________ Allergies: Allergies  Allergen Reactions   Amlodipine  Other (See Comments)    Gingival hyperplasia     Prior to Admission medications   Medication Sig Start Date End Date Taking? Authorizing Provider  buPROPion  (WELLBUTRIN  XL) 150 MG 24 hr tablet Take 1 tablet (150 mg total) by mouth daily. 07/04/23   [name], NP  chlorthalidone  (HYGROTON ) 25 MG tablet TAKE 1 TABLET(25 MG) BY MOUTH DAILY 03/12/23   Avanell Leigh, MD  hydrocortisone  (ANUSOL -HC) 25 MG suppository Place 1 suppository (25 mg total) rectally 2 (two) times daily as needed for hemorrhoids. 07/04/23   Jessup, Joy, NP  mesalamine (LIALDA) 1.2 g EC tablet Take 1.2 g by mouth daily with breakfast.    [provider]  Nebivolol  HCl 20 MG TABS Take 1 tablet (20 mg total) by mouth daily. 02/07/23   End, Veryl Gottron, MD  olmesartan  (BENICAR ) 40 MG tablet TAKE 1 TABLET(40 MG) BY MOUTH DAILY 04/06/23   End, Veryl Gottron, MD    ___________________________________________________________________________________________________ Physical Exam:    07/05/2023   10:43 PM 07/05/2023    8:52 PM  07/05/2023    8:00 PM  Vitals with BMI  Systolic  146   Diastolic  93   Pulse 61 63 71     1. General:  in No ***Acute distress***increased work of breathing ***complaining of severe pain****agitated * Chronically ill *well *cachectic *toxic acutely ill -appearing 2. Psychological: Alert and *** Oriented 3. Head/ENT:   Moist *** Dry Mucous Membranes                          Head Non traumatic, neck supple                          Normal *** Poor Dentition 4. SKIN: normal *** decreased Skin turgor,  Skin clean Dry and intact no rash    5. Heart: Regular rate and rhythm no*** Murmur, no Rub or gallop 6. Lungs: ***Clear to auscultation bilaterally, no wheezes or crackles   7. Abdomen: Soft, ***non-tender, Non distended *** obese ***bowel sounds present 8. Lower extremities: no clubbing, cyanosis, no ***edema 9. Neurologically Grossly intact, moving all 4 extremities equally *** strength 5 out of 5 in all 4 extremities cranial nerves II through XII intact 10. MSK: Normal range  of motion    Chart has been reviewed  ______________________________________________________________________________________________  Assessment/Plan  ***  Admitted for *** TIA (transient ischemic attack) ***  Cerebrovascular accident (CVA), unspecified mechanism (HCC) ***    Present on Admission:  Stroke-like symptom     No problem-specific Assessment & Plan notes found for this encounter.    Other plan as per orders.  DVT prophylaxis:  SCD *** Lovenox       Code Status:    Code Status: Not on file FULL CODE *** DNR/DNI ***comfort care as per patient ***family  I had personally discussed CODE STATUS with patient and family*  ACP *** none has been reviewed ***   Family Communication:   Family not at  Bedside  plan of care was discussed on the phone with *** Son, Daughter, Wife, Husband, Sister, Brother , father, mother  Diet  Diet Orders (From admission, onward)     Start     Ordered    07/04/23 2207  Diet NPO time specified  Diet effective now       Comments: NPO until stroke swallow screen is complete   07/04/23 2206            Disposition Plan:   *** likely will need placement for rehabilitation                          Back to current facility when stable                            To home once workup is complete and patient is stable  ***Following barriers for discharge:                             Chest pain *** Stroke *** Syncope ***work up is complete                            Electrolytes corrected                               Anemia corrected h/H stable                             Pain controlled with PO medications                               Afebrile, white count improving able to transition to PO antibiotics                             Will need to be able to tolerate PO                            Will likely need home health, home O2, set up                           Will need consultants to evaluate patient prior to discharge                           Work of  breathing improves       Consult Orders  (From admission, onward)           Start     Ordered   07/05/23 0219  Consult to hospitalist  -Called [facility] at 222am, [facility] to [facility].  Once       Provider:  (Not yet assigned)  Question Answer Comment  Place call to: [facility]   Reason for Consult Admit      07/05/23 0218                              ***Would benefit from PT/OT eval prior to DC  Ordered                   Swallow eval - SLP ordered                   Diabetes care coordinator                   Transition of care consulted                   Nutrition    consulted                  Wound care  consulted                   Palliative care    consulted                   Behavioral health  consulted                    Consults called: ***     Admission status:  ED Disposition     ED Disposition  Admit   Condition  --   Comment  Hospital Area:  [facility] [[id number]]  Level of Care: Telemetry Medical [104]  Interfacility transfer: Yes  May place patient in observation at [facility] or [facility] if equivalent level of care is available:: No  Covid Evaluation: Asymptomatic - no recent exposure (last 10 days) testing not required  Diagnosis: Stroke-like symptom [578469]  Admitting Physician: SEGARS, JONATHAN [[id number]]  Attending Physician: SEGARS, JONATHAN [[id number]]           Obs***  ***  inpatient     I Expect 2 midnight stay secondary to severity of patient's current illness need for inpatient interventions justified by the following: ***hemodynamic instability despite optimal treatment (tachycardia *hypotension * tachypnea *hypoxia, hypercapnia) *** Severe lab/radiological/exam abnormalities including:    TIA (transient ischemic attack) ***  Cerebrovascular accident (CVA), unspecified mechanism (HCC) ***  and extensive comorbidities including: *substance abuse  *Chronic pain *DM2  * CHF * CAD  * COPD/asthma *Morbid Obesity * CKD *dementia *liver disease *history of stroke with residual deficits *  malignancy, * sickle cell disease  History of amputation Chronic anticoagulation  That are currently affecting medical management.   I expect  patient to be hospitalized for 2 midnights requiring inpatient medical care.  Patient is at high risk for adverse outcome (such as loss of life or disability) if not treated.  Indication for inpatient stay as follows:  Severe change from baseline regarding mental status Hemodynamic instability despite maximal medical therapy,  severe pain requiring acute inpatient management,  inability to maintain oral hydration   persistent chest pain despite medical management Need for operative/procedural  intervention  New or worsening hypoxia ongoing suicidal ideations   Need for IV antibiotics, IV fluids, IV rate controling medications, IV  antihypertensives, IV pain medications, IV anticoagulation, need for biPAP    Level of care     tele  For 12H      Kal Chait 07/05/2023, 11:04 PM ***  [facility]     after 2 AM please page floor coverage   If 7AM-7PM, please contact the day team taking care of the patient using [email or web address]

## 2023-07-05 NOTE — Subjective & Objective (Signed)
 Patient presents with facial droop to [facility],  Facial droop have started between 12 to 24 hours prior to presentation.  Denies any chest pain abdominal pain headaches no shortness of breath He has known history of atrial fibrillation also reports dysarthria and difficulty swallowing initially but now resolved last known well was 11 AM.

## 2023-07-05 NOTE — ED Notes (Signed)
 Called [facility] for transport for ready bed

## 2023-07-06 ENCOUNTER — Other Ambulatory Visit (HOSPITAL_COMMUNITY): Payer: Self-pay

## 2023-07-06 ENCOUNTER — Observation Stay (HOSPITAL_COMMUNITY)

## 2023-07-06 ENCOUNTER — Other Ambulatory Visit: Payer: Self-pay | Admitting: Cardiology

## 2023-07-06 ENCOUNTER — Observation Stay (HOSPITAL_BASED_OUTPATIENT_CLINIC_OR_DEPARTMENT_OTHER)

## 2023-07-06 DIAGNOSIS — D649 Anemia, unspecified: Secondary | ICD-10-CM | POA: Diagnosis present

## 2023-07-06 DIAGNOSIS — I6621 Occlusion and stenosis of right posterior cerebral artery: Secondary | ICD-10-CM

## 2023-07-06 DIAGNOSIS — E876 Hypokalemia: Secondary | ICD-10-CM | POA: Diagnosis present

## 2023-07-06 DIAGNOSIS — I63431 Cerebral infarction due to embolism of right posterior cerebral artery: Secondary | ICD-10-CM

## 2023-07-06 DIAGNOSIS — G459 Transient cerebral ischemic attack, unspecified: Secondary | ICD-10-CM

## 2023-07-06 DIAGNOSIS — R2990 Unspecified symptoms and signs involving the nervous system: Secondary | ICD-10-CM

## 2023-07-06 LAB — ECHOCARDIOGRAM COMPLETE
AR max vel: 2.74 cm2
AV Area VTI: 3.08 cm2
AV Area mean vel: 3.06 cm2
AV Mean grad: 6 mmHg
AV Peak grad: 12 mmHg
Ao pk vel: 1.73 m/s
Area-P 1/2: 3.95 cm2
Est EF: 55
Height: 73 in
S' Lateral: 4.1 cm
Weight: 5424 [oz_av]

## 2023-07-06 LAB — FOLATE: Folate: 29.1 ng/mL (ref >5.9)

## 2023-07-06 LAB — URINALYSIS, ROUTINE W REFLEX MICROSCOPIC
Bilirubin Urine: NEGATIVE
Glucose, UA: NEGATIVE mg/dL
Hgb urine dipstick: NEGATIVE
Ketones, ur: NEGATIVE mg/dL
Leukocytes,Ua: NEGATIVE
Nitrite: NEGATIVE
Protein, ur: NEGATIVE mg/dL
Specific Gravity, Urine: 1.013 (ref 1.005–1.030)
pH: 6 (ref 5.0–8.0)

## 2023-07-06 LAB — BASIC METABOLIC PANEL WITH GFR
Anion gap: 12 (ref 5–15)
BUN: 10 mg/dL (ref 6–20)
CO2: 26 mmol/L (ref 22–32)
Calcium: 9.1 mg/dL (ref 8.9–10.3)
Chloride: 102 mmol/L (ref 98–111)
Creatinine, Ser: 1.01 mg/dL (ref 0.61–1.24)
GFR, Estimated: >60 mL/min (ref >60)
Glucose, Bld: 111 mg/dL — ABNORMAL HIGH (ref 70–99)
Potassium: 2.8 mmol/L — ABNORMAL LOW (ref 3.5–5.1)
Sodium: 140 mmol/L (ref 135–145)

## 2023-07-06 LAB — MAGNESIUM: Magnesium: 2.1 mg/dL (ref 1.7–2.4)

## 2023-07-06 LAB — IRON AND TIBC
Iron: 39 ug/dL — ABNORMAL LOW (ref 45–182)
Saturation Ratios: 13 % — ABNORMAL LOW (ref 17.9–39.5)
TIBC: 309 ug/dL (ref 250–450)
UIBC: 270 ug/dL

## 2023-07-06 LAB — HIV ANTIBODY (ROUTINE TESTING W REFLEX): HIV Screen 4th Generation wRfx: NONREACTIVE

## 2023-07-06 LAB — FERRITIN: Ferritin: 45 ng/mL (ref 24–336)

## 2023-07-06 LAB — CBC
HCT: 38.2 % — ABNORMAL LOW (ref 39.0–52.0)
Hemoglobin: 12.8 g/dL — ABNORMAL LOW (ref 13.0–17.0)
MCH: 26.7 pg (ref 26.0–34.0)
MCHC: 33.5 g/dL (ref 30.0–36.0)
MCV: 79.7 fL — ABNORMAL LOW (ref 80.0–100.0)
Platelets: 367 10*3/uL (ref 150–400)
RBC: 4.79 MIL/uL (ref 4.22–5.81)
RDW: 12.9 % (ref 11.5–15.5)
WBC: 7.2 10*3/uL (ref 4.0–10.5)
nRBC: 0 % (ref 0.0–0.2)

## 2023-07-06 LAB — LIPID PANEL
Cholesterol: 167 mg/dL (ref 0–200)
HDL: 50 mg/dL (ref >40)
LDL Cholesterol: 104 mg/dL — ABNORMAL HIGH (ref 0–99)
Total CHOL/HDL Ratio: 3.3 ratio
Triglycerides: 64 mg/dL (ref <150)
VLDL: 13 mg/dL (ref 0–40)

## 2023-07-06 LAB — RETICULOCYTES
Immature Retic Fract: 17.8 % — ABNORMAL HIGH (ref 2.3–15.9)
RBC.: 4.75 MIL/uL (ref 4.22–5.81)
Retic Count, Absolute: 87.4 10*3/uL (ref 19.0–186.0)
Retic Ct Pct: 1.8 % (ref 0.4–3.1)

## 2023-07-06 LAB — TSH: TSH: 1.346 u[IU]/mL (ref 0.350–4.500)

## 2023-07-06 LAB — CK: Total CK: 379 U/L (ref 49–397)

## 2023-07-06 LAB — HEMOGLOBIN A1C
Hgb A1c MFr Bld: 6.2 % — ABNORMAL HIGH (ref 4.8–5.6)
Mean Plasma Glucose: 131.24 mg/dL

## 2023-07-06 LAB — PHOSPHORUS: Phosphorus: 2.9 mg/dL (ref 2.5–4.6)

## 2023-07-06 LAB — VITAMIN B12: Vitamin B-12: 978 pg/mL — ABNORMAL HIGH (ref 180–914)

## 2023-07-06 MED ORDER — APIXABAN 5 MG PO TABS
5.0000 mg | ORAL_TABLET | Freq: Two times a day (BID) | ORAL | Status: DC
  Administered 2023-07-06: 5 mg via ORAL
  Filled 2023-07-06: qty 1

## 2023-07-06 MED ORDER — ROSUVASTATIN CALCIUM 20 MG PO TABS
20.0000 mg | ORAL_TABLET | Freq: Every day | ORAL | 0 refills | Status: DC
  Filled 2023-07-06: qty 30, 30d supply, fill #0

## 2023-07-06 MED ORDER — CLOPIDOGREL BISULFATE 75 MG PO TABS
75.0000 mg | ORAL_TABLET | Freq: Every day | ORAL | 0 refills | Status: AC
  Filled 2023-07-06: qty 21, 21d supply, fill #0

## 2023-07-06 MED ORDER — OLMESARTAN MEDOXOMIL 40 MG PO TABS: 40.0000 mg | ORAL_TABLET | Freq: Every day | ORAL | Status: DC

## 2023-07-06 MED ORDER — POTASSIUM CHLORIDE CRYS ER 20 MEQ PO TBCR
40.0000 meq | EXTENDED_RELEASE_TABLET | Freq: Once | ORAL | Status: AC
Start: 2023-07-06 — End: 2023-07-06
  Administered 2023-07-06: 40 meq via ORAL
  Filled 2023-07-06: qty 2

## 2023-07-06 MED ORDER — ROSUVASTATIN CALCIUM 20 MG PO TABS
20.0000 mg | ORAL_TABLET | Freq: Every day | ORAL | Status: DC
  Administered 2023-07-06: 20 mg via ORAL
  Filled 2023-07-06: qty 1

## 2023-07-06 MED ORDER — ASPIRIN 81 MG PO TBEC
81.0000 mg | DELAYED_RELEASE_TABLET | Freq: Every day | ORAL | 11 refills | Status: DC
  Filled 2023-07-06: qty 30, 30d supply, fill #0

## 2023-07-06 MED ORDER — APIXABAN 5 MG PO TABS
5.0000 mg | ORAL_TABLET | Freq: Two times a day (BID) | ORAL | 0 refills | Status: DC
Start: 2023-07-06 — End: 2023-07-06
  Filled 2023-07-06: qty 60, 30d supply, fill #0

## 2023-07-06 MED ORDER — POTASSIUM CHLORIDE 10 MEQ/100ML IV SOLN
10.0000 meq | INTRAVENOUS | Status: AC
  Administered 2023-07-06 (×4): 10 meq via INTRAVENOUS
  Filled 2023-07-06 (×4): qty 100

## 2023-07-06 MED ORDER — CHLORTHALIDONE 25 MG PO TABS: 25.0000 mg | ORAL_TABLET | Freq: Every day | ORAL | Status: DC

## 2023-07-06 MED ORDER — NEBIVOLOL HCL 20 MG PO TABS: 20.0000 mg | ORAL_TABLET | Freq: Every day | ORAL | Status: DC

## 2023-07-06 NOTE — Consult Note (Signed)
 NEUROLOGY CONSULT NOTE   Date of service: July 06, 2023 Patient Name: Zachary Cruz MRN:  [id number] DOB:  March 19, 1980 Chief Complaint: "episode of left facial droop, dysarthria and dysphagia" Requesting Provider: Arnulfo Larch, MD  History of Present Illness  Zachary Cruz is a 43 y.o. male with hx of atrial fibrillation not on anticoagulation, hypertension, OSA who presents with an episode that he describes as feeling weird, left facial droop and slurring speech with dysphagia and inability to swallow.  His symptoms have largely resolved but he came into the ED for further evaluation and workup.  He had workup with CT head without contrast which was negative for acute intracranial abnormalities.  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  He had an MRI of the brain which demonstrated right cerebral peduncle acute ischemic stroke.  Neurology was consulted further evaluation and workup of the noted stroke.  He endorses hx of afibb. Was diagnosed back in 2015 while he was in [place] .  LKW: 1600 on 07/05/2023 Modified rankin score: 0-Completely asymptomatic and back to baseline post- stroke IV Thrombolysis: Not offered, symptoms spontaneously resolved and is too mild to treat at this time.   EVT: Not offered, too mild to treat and no LVO.    NIHSS components Score: Comment  1a Level of Conscious 0[x]  1[]  2[]  3[]      1b LOC Questions 0[x]  1[]  2[]       1c LOC Commands 0[x]  1[]  2[]       2 Best Gaze 0[x]  1[]  2[]       3 Visual 0[x]  1[]  2[]  3[]      4 Facial Palsy 0[x]  1[]  2[]  3[]      5a Motor Arm - left 0[x]  1[]  2[]  3[]  4[]  UN[]    5b Motor Arm - Right 0[x]  1[]  2[]  3[]  4[]  UN[]    6a Motor Leg - Left 0[x]  1[]  2[]  3[]  4[]  UN[]    6b Motor Leg - Right 0[x]  1[]  2[]  3[]  4[]  UN[]    7 Limb Ataxia 0[x]  1[]  2[]  3[]  UN[]     8 Sensory 0[x]  1[]  2[]  UN[]      9 Best Language 0[x]  1[]  2[]  3[]      10 Dysarthria  0[x]  1[]  2[]  UN[]      11 Extinct. and Inattention 0[x]  1[]  2[]       TOTAL: 0      ROS  Comprehensive ROS performed and pertinent positives documented in HPI   Past History   Past Medical History:  Diagnosis Date   Atrial fibrillation (HCC)    Childhood asthma    Hypertension    Sleep apnea     Past Surgical History:  Procedure Laterality Date   CHOLECYSTECTOMY N/A 11/08/2016   Procedure: LAPAROSCOPIC CHOLECYSTECTOMY;  Surgeon: Claudia Cuff, MD;  Location: [facility] ORS;  Service: General;  Laterality: N/A;   TONSILLECTOMY      Family History: Family History  Problem Relation Age of Onset   Diabetes Father    Mental illness Father    Hypertension Maternal Grandfather     Social History  reports that he quit smoking about 20 years ago. His smoking use included cigarettes. He started smoking about 27 years ago. He has a 0.7 pack-year smoking history. He has never used smokeless tobacco. He reports that he does not currently use drugs. He reports that he does not drink alcohol.  Allergies  Allergen Reactions   Amlodipine  Other (See Comments)  Gingival hyperplasia    Medications   Current Facility-Administered Medications:     stroke: early stages of recovery book, , Does not apply, Once, Doutova, Anastassia, MD   acetaminophen  (TYLENOL ) tablet 650 mg, 650 mg, Oral, Q6H PRN **OR** acetaminophen  (TYLENOL ) suppository 650 mg, 650 mg, Rectal, Q6H PRN, Doutova, Anastassia, MD   aspirin  EC tablet 81 mg, 81 mg, Oral, Daily, Doutova, Anastassia, MD   buPROPion  (WELLBUTRIN  XL) 24 hr tablet 150 mg, 150 mg, Oral, Daily, Doutova, Anastassia, MD   HYDROcodone -acetaminophen  (NORCO/VICODIN) 5-325 MG per tablet 1-2 tablet, 1-2 tablet, Oral, Q4H PRN, Doutova, Anastassia, MD   ondansetron  (ZOFRAN ) tablet 4 mg, 4 mg, Oral, Q6H PRN **OR** ondansetron  (ZOFRAN ) injection 4 mg, 4 mg, Intravenous, Q6H PRN, Doutova, Anastassia, MD  Vitals   Vitals:   07/05/23 2000 07/05/23 2052 07/05/23  2243 07/06/23 0342  BP:  (!) 146/93  (!) 150/90  Pulse: 71 63 61 60  Resp: 17 20 18 18   Temp:   97.7 F (36.5 C) 98.1 F (36.7 C)  TempSrc:   Oral Oral  SpO2: 98% 99% 96% 97%  Weight:      Height:        Body mass index is 44.73 kg/m.  Physical Exam   General: Laying comfortably in bed; in no acute distress.  HENT: Normal oropharynx and mucosa. Normal external appearance of ears and nose.  Neck: Supple, no pain or tenderness  CV: No JVD. No peripheral edema.  Pulmonary: Symmetric Chest rise. Normal respiratory effort.  Abdomen: Soft to touch, non-tender.  Ext: No cyanosis, edema, or deformity  Skin: No rash. Normal palpation of skin.   Musculoskeletal: Normal digits and nails by inspection. No clubbing.   Neurologic Examination  Mental status/Cognition: Alert, oriented to self, place, month and year, good attention.  Speech/language: Fluent, comprehension intact, object naming intact, repetition intact.  Cranial nerves:   CN II Pupils equal and reactive to light, no VF deficits    CN III,IV,VI EOM intact, no gaze preference or deviation, no nystagmus    CN V normal sensation in V1, V2, and V3 segments bilaterally    CN VII no asymmetry, no nasolabial fold flattening    CN VIII normal hearing to speech    CN IX & X normal palatal elevation, no uvular deviation    CN XI 5/5 head turn and 5/5 shoulder shrug bilaterally    CN XII midline tongue protrusion    Motor:  Muscle bulk: Normal, tone normal, pronator drift none Mvmt Root Nerve  Muscle Right Left Comments  SA C5/6 Ax Deltoid 5 5   EF C5/6 Mc Biceps 5 5   EE C6/7/8 Rad Triceps 5 5   WF C6/7 Med FCR     WE C7/8 PIN ECU     F Ab C8/T1 U ADM/FDI 5 5   HF L1/2/3 Fem Illopsoas 5 4+   KE L2/3/4 Fem Quad 5 5   DF L4/5 D Peron Tib Ant 5 5   PF S1/2 Tibial Grc/Sol 5 5    Sensation:  Light touch Intact throughout   Pin prick    Temperature    Vibration   Proprioception    Coordination/Complex Motor:  - Finger to  Nose intact bilaterally - Heel to shin intact bilaterally - Rapid alternating movement are normal - Gait: Deferred for patient's safety. Labs/Imaging/Neurodiagnostic studies   CBC:  Recent Labs  Lab Jul 17, 2023 2214  WBC 7.4  NEUTROABS 3.7  HGB 12.6*  HCT 37.6*  MCV 80.5  PLT 354   Basic Metabolic Panel:  Lab Results  Component Value Date   NA 139 07/04/2023   K 3.0 (L) 07/04/2023   CO2 27 07/04/2023   GLUCOSE 93 07/04/2023   BUN 12 07/04/2023   CREATININE 1.18 07/04/2023   CALCIUM  9.2 07/04/2023   GFRNONAA >60 07/04/2023   GFRAA >60 11/02/2016   Lipid Panel:  Lab Results  Component Value Date   LDLCALC 94 05/08/2022   HgbA1c:  Lab Results  Component Value Date   HGBA1C 6.2 (A) 11/06/2022   Urine Drug Screen:     Component Value Date/Time   LABOPIA NONE DETECTED 07/04/2023 2214   COCAINSCRNUR NONE DETECTED 07/04/2023 2214   LABBENZ NONE DETECTED 07/04/2023 2214   AMPHETMU NONE DETECTED 07/04/2023 2214   THCU NONE DETECTED 07/04/2023 2214   LABBARB NONE DETECTED 07/04/2023 2214    Alcohol Level     Component Value Date/Time   [facility] <15 07/04/2023 2214   INR  Lab Results  Component Value Date   INR 1.0 07/04/2023   APTT  Lab Results  Component Value Date   APTT 27 07/04/2023   AED levels: No results found for: "PHENYTOIN", "ZONISAMIDE", "LAMOTRIGINE", "LEVETIRACETA"  CT Head without contrast(Personally reviewed): CTH was negative for a large hypodensity concerning for a large territory infarct or hyperdensity concerning for an ICH  CT angio Head and Neck with contrast(Personally reviewed): Right PCA occlusion, near occlusion of the right carotid terminus.  MRI Brain(Personally reviewed): Right cerebral peduncle acute ischemic stroke.  ASSESSMENT   Zachary Cruz is a 43 y.o. male with history of atrial fibrillation and not on anticoagulation, hypertension, OSA who presented with left facial droop, slurred speech, difficulty swallowing.  His  neuroexam is notable for mild left lower extremity weakness.  He was found to have right cerebral peduncle stroke on MRI of the brain with CT angio demonstrating right PCA occlusion and right carotid terminus near occlusion.  He is not a candidate for TNKase as he was outside the window and his symptoms are resolved spontaneously.  He is not a candidate for thrombectomy due to no LVO.  With his prior history of atrial fibrillation, he will need to be on anticoagulation to reduce risk of stroke in the future.  RECOMMENDATIONS  - Frequent Neuro checks per stroke unit protocol - Recommend brain imaging with MRI Brain without contrast - Recommend obtaining TTE  - Recommend obtaining Lipid panel with LDL - Please start statin if LDL > 70 - Recommend HbA1c to evaluate for diabetes and how well it is controlled. - Eliquis  5 mg twice daily for secondary stroke prevention. - SBP goal - permissive hypertension first 24 h < 220/110. Held home meds.  - Recommend Telemetry monitoring for arrythmia - Recommend bedside swallow screen prior to PO intake. - Stroke education booklet - Recommend PT/OT/SLP consult  ______________________________________________________________________   Signed, Nieve Rojero, MD [facility]

## 2023-07-06 NOTE — Assessment & Plan Note (Signed)
 Patient denies

## 2023-07-06 NOTE — Progress Notes (Signed)
  30 day event monitor per neurology team request. Results to Dr. Emmette Harms.   Leala Prince, PA-C

## 2023-07-06 NOTE — Assessment & Plan Note (Signed)
 Obtain anemia panel  Transfuse for Hg <7 , rapidly dropping or  if symptomatic If evidence of iron deficiency anemia may need Hemoccult stool

## 2023-07-06 NOTE — Plan of Care (Signed)
  Problem: Education: Goal: Knowledge of General Education information will improve Description Including pain rating scale, medication(s)/side effects and non-pharmacologic comfort measures Outcome: Progressing   Problem: Clinical Measurements: Goal: Respiratory complications will improve Outcome: Not Applicable   Problem: Activity: Goal: Risk for activity intolerance will decrease Outcome: Progressing

## 2023-07-06 NOTE — Discharge Summary (Addendum)
 Zachary Cruz QIO:[id number] DOB: [birth year] DOA: 07/04/2023  PCP: [name], NP  Admit date: 07/04/2023  Discharge date: 07/06/2023  Admitted From: Home   Disposition:  Home   Recommendations for Outpatient Follow-up:   Follow up with PCP in 1-2 weeks  PCP Please obtain BMP/CBC, 2 view CXR in 1week,  (see Discharge instructions)   PCP Please follow up on the following pending results: Monitor BMP, magnesium, anemia panel closely.  Outpatient follow-up with cardiology, neurology and vascular surgery.  Will require 30-day event monitor cardiology informed.   Home Health: None   Equipment/Devices: None  Consultations: Neuro Discharge Condition: Stable    CODE STATUS: Full    Diet Recommendation: Heart Healthy     Chief Complaint  Patient presents with   Facial Droop     Brief history of present illness from the day of admission and additional interim summary    43 y.o. male with medical history significant of A-fib, hypertension obesity sleep apnea depression    Presented with right  side facial spasm dysarthria dysphagia and now resolved reports when he would smile his face would get stuck like that on the right but the left was moved spontaneously normally He was very fatigued When he tried to eat he could not swallow And that scared him this happened yesterday Patient presents with facial droop to [facility],  Facial droop have started between 12 to 24 hours prior to presentation.  Denies any chest pain abdominal pain headaches no shortness of breath He has known history of atrial fibrillation also reports dysarthria and difficulty swallowing initially but now resolved last known well was 11 AM. Yesterday.  Workup here was consistent with acute stroke in the setting of paroxysmal A-fib.                                                                  Hospital Course   Acute CVA.  Full stroke workup done, case discussed with stroke team Dr. Janett Medin, she has been placed on aspirin  and Plavix  for 3 weeks thereafter aspirin  along with Crestor  for LDL above goal, A1c stable, is morbidly obese will follow with PCP for weight loss, continue nighttime CPAP for sleep apnea.  Follow-up with cardiology and PCP for secondary prevention.  Symptoms resolved back to baseline eager to go home.    Carotid artery disease.  Statin and Eliquis .  Outpatient vascular surgery follow-up.    Paroxysmal atrial fibrillation (HCC) Not on anticoagulation, [place] vasc 2 score of now greater than 3 with stroke, case discussed with stroke team Dr. Annitta Kindler he will require 30-day heart monitor and follow-up with PCP to see if he requires Eliquis , sounds like he had 1 off episode of A-fib in the past according to the patient.  Beta-blocker continued.   Essential  hypertension Allow permissive hypertension, home blood pressure medications will be started in a graduated fashion over the next few days   Morbid obesity (HCC) MI 43 follow with PCP for weight loss.   OSA (obstructive sleep apnea) On nightly CPAP   Prediabetes Patient denies   Hypokalemia Replaced  AOCD - PCP to monitor    Discharge diagnosis     Principal Problem:   Stroke-like symptom Active Problems:   OSA (obstructive sleep apnea)   Atrial fibrillation (HCC)   Essential hypertension   Morbid obesity (HCC)   Prediabetes   TIA (transient ischemic attack)   Hypokalemia   Anemia    Discharge instructions    Discharge Instructions     Diet - low sodium heart healthy   Complete by: As directed    Discharge instructions   Complete by: As directed    Follow with Primary MD [name], NP in 7 days   Get CBC, CMP, magnesium-  checked next visit with your primary MD   Activity: As tolerated with Full fall precautions use walker/cane &  assistance as needed  Disposition Home    Diet: Heart Healthy    Special Instructions: If you have smoked or chewed Tobacco  in the last 2 yrs please stop smoking, stop any regular Alcohol  and or any Recreational drug use.  On your next visit with your primary care physician please Get Medicines reviewed and adjusted.  Please request your Prim.MD to go over all Hospital Tests and Procedure/Radiological results at the follow up, please get all Hospital records sent to your Prim MD by signing hospital release before you go home.  If you experience worsening of your admission symptoms, develop shortness of breath, life threatening emergency, suicidal or homicidal thoughts you must seek medical attention immediately by calling 911 or calling your MD immediately  if symptoms less severe.  You Must read complete instructions/literature along with all the possible adverse reactions/side effects for all the Medicines you take and that have been prescribed to you. Take any new Medicines after you have completely understood and accpet all the possible adverse reactions/side effects.   Do not drive when taking Pain medications.  Do not take more than prescribed Pain, Sleep and Anxiety Medications  Wear Seat belts while driving.   Increase activity slowly   Complete by: As directed        Discharge Medications   Allergies as of 07/06/2023       Reactions   Amlodipine  Other (See Comments)   Gingival hyperplasia        Medication List     STOP taking these medications    buPROPion  150 MG 24 hr tablet Commonly known as: Wellbutrin  XL   hydrocortisone  25 MG suppository Commonly known as: ANUSOL -HC       TAKE these medications    aspirin  EC 81 MG tablet Take 1 tablet (81 mg total) by mouth daily. Swallow whole.   chlorthalidone  25 MG tablet Commonly known as: HYGROTON  Take 1 tablet (25 mg total) by mouth daily. Start taking on: July 07, 2023 What changed: See the new  instructions.   clopidogrel  75 MG tablet Commonly known as: Plavix  Take 1 tablet (75 mg total) by mouth daily for 21 days.   mesalamine 1.2 g EC tablet Commonly known as: LIALDA Take 2.4 g by mouth daily with breakfast.   Nebivolol  HCl 20 MG Tabs Take 1 tablet (20 mg total) by mouth daily. Start taking on: July 07, 2023  olmesartan  40 MG tablet Commonly known as: BENICAR  Take 1 tablet (40 mg total) by mouth daily. Start taking on: July 08, 2023 What changed:  See the new instructions. These instructions start on July 08, 2023. If you are unsure what to do until then, ask your doctor or other care provider.   rosuvastatin  20 MG tablet Commonly known as: CRESTOR  Take 1 tablet (20 mg total) by mouth daily.         Follow-up Information     [name], NP. Schedule an appointment as soon as possible for a visit in 1 week(s).   Specialty: Nurse Practitioner Contact information: [address] [place] [address] [phone]         [facility]. Schedule an appointment as soon as possible for a visit in 1 month(s).   Contact information: [address]     [address] [place]  [address] [phone]        Jerryl Morin, DO. Schedule an appointment as soon as possible for a visit in 1 week(s).   Specialty: Cardiology Contact information: [address] [place] [address] [phone]         Kayla Part, MD. Schedule an appointment as soon as possible for a visit in 1 week(s).   Specialty: Vascular Surgery Why: Carotid artery disease Contact information: [address] [place] [address] [phone]                 Major procedures and Radiology Reports - PLEASE review detailed and final reports thoroughly  -       ECHOCARDIOGRAM COMPLETE Result Date: 07/06/2023    ECHOCARDIOGRAM REPORT   Patient Name:   ARSH FEUTZ Date of Exam: 07/06/2023 Medical  Rec #:  [id number]           Height:       73.0 in Accession #:    [id number]          Weight:       339.0 lb Date of Birth:  02-Sep-1980           BSA:          2.693 m Patient Age:    42 years            BP:           150/90 mmHg Patient Gender: M                   HR:           64 bpm. Exam Location:  Inpatient Procedure: 2D Echo, Cardiac Doppler, Color Doppler and Saline Contrast Bubble            Study (Both Spectral and Color Flow Doppler were utilized during            procedure). Indications:    TIA  History:        Patient has no prior history of Echocardiogram examinations.                 Arrythmias:Atrial Fibrillation; Risk Factors:Hypertension and                 Sleep Apnea.  Sonographer:    Astrid Blamer Referring Phys: [id number] ANASTASSIA DOUTOVA IMPRESSIONS  1. Left ventricular ejection fraction, by estimation, is 55%. The left ventricle has normal function. The left ventricle has no regional wall motion abnormalities. Left ventricular diastolic parameters were normal.  2. Right  ventricular systolic function was not well visualized. The right ventricular size is not well visualized. Tricuspid regurgitation signal is inadequate for assessing PA pressure.  3. Bubble study was probably negative but images very poor.  4. The mitral valve is normal in structure. Trivial mitral valve regurgitation. No evidence of mitral stenosis.  5. The aortic valve was not well visualized. Aortic valve regurgitation is not visualized. No aortic stenosis is present.  6. The IVC was not visualized.  7. Technically difficult study with poor acoustic windows. FINDINGS  Left Ventricle: Left ventricular ejection fraction, by estimation, is 55%. The left ventricle has normal function. The left ventricle has no regional wall motion abnormalities. The left ventricular internal cavity size was normal in size. There is no left ventricular hypertrophy. Left ventricular diastolic parameters were normal. Right Ventricle: The right  ventricular size is not well visualized. Right vetricular wall thickness was not well visualized. Right ventricular systolic function was not well visualized. Tricuspid regurgitation signal is inadequate for assessing PA pressure. Left Atrium: Left atrial size was normal in size. Right Atrium: Right atrial size was not well visualized. Pericardium: There is no evidence of pericardial effusion. Mitral Valve: The mitral valve is normal in structure. Trivial mitral valve regurgitation. No evidence of mitral valve stenosis. Tricuspid Valve: The tricuspid valve is normal in structure. Tricuspid valve regurgitation is not demonstrated. Aortic Valve: The aortic valve was not well visualized. Aortic valve regurgitation is not visualized. No aortic stenosis is present. Aortic valve mean gradient measures 6.0 mmHg. Aortic valve peak gradient measures 12.0 mmHg. Aortic valve area, by VTI measures 3.08 cm. Pulmonic Valve: The pulmonic valve was normal in structure. Pulmonic valve regurgitation is not visualized. Aorta: The aortic root is normal in size and structure. Venous: The IVC was not visualized. IAS/Shunts: Bubble study was probably negative but images very poor. Agitated saline contrast was given intravenously to evaluate for intracardiac shunting.  LEFT VENTRICLE PLAX 2D LVIDd:         6.00 cm   Diastology LVIDs:         4.10 cm   LV e' medial:    8.70 cm/s LV PW:         1.00 cm   LV E/e' medial:  9.3 LV IVS:        1.00 cm   LV e' lateral:   10.60 cm/s LVOT diam:     2.00 cm   LV E/e' lateral: 7.6 LV SV:         104 LV SV Index:   39 LVOT Area:     3.14 cm  LEFT ATRIUM             Index LA Vol (A2C):   59.1 ml 21.95 ml/m LA Vol (A4C):   46.0 ml 17.08 ml/m LA Biplane Vol: 52.2 ml 19.39 ml/m  AORTIC VALVE AV Area (Vmax):    2.74 cm AV Area (Vmean):   3.06 cm AV Area (VTI):     3.08 cm AV Vmax:           173.00 cm/s AV Vmean:          111.000 cm/s AV VTI:            0.339 m AV Peak Grad:      12.0 mmHg AV Mean  Grad:      6.0 mmHg LVOT Vmax:         151.00 cm/s LVOT Vmean:        108.000 cm/s  LVOT VTI:          0.332 m LVOT/AV VTI ratio: 0.98  AORTA Ao Root diam: 3.20 cm MITRAL VALVE MV Area (PHT): 3.95 cm    SHUNTS MV Decel Time: 192 msec    Systemic VTI:  0.33 m MV E velocity: 80.80 cm/s  Systemic Diam: 2.00 cm MV A velocity: 61.30 cm/s MV E/A ratio:  1.32 Dalton McleanMD Electronically signed by Archer Bear Signature Date/Time: 07/06/2023/9:13:05 AM    Final    DG CHEST PORT 1 VIEW Result Date: 07/06/2023 CLINICAL DATA:  TIA EXAM: PORTABLE CHEST 1 VIEW COMPARISON:  06/20/2023 FINDINGS: Normal heart size and mediastinal contours. No acute infiltrate or edema. No effusion or pneumothorax. No acute osseous findings. Artifact from EKG leads. IMPRESSION: No active disease. Electronically Signed   By: Ronnette Coke M.D.   On: 07/06/2023 04:30   MR BRAIN WO CONTRAST Result Date: 07/06/2023 CLINICAL DATA:  Transient ischemic attack EXAM: MRI HEAD WITHOUT CONTRAST TECHNIQUE: Multiplanar, multiecho pulse sequences of the brain and surrounding structures were obtained without intravenous contrast. COMPARISON:  None Available. FINDINGS: Brain: Small focus of acute ischemia within the right cerebral peduncle. No acute or chronic hemorrhage. There is multifocal hyperintense T2-weighted signal within the white matter. Parenchymal volume and CSF spaces are normal. The midline structures are normal. Vascular: Normal flow voids. Skull and upper cervical spine: Normal calvarium and skull base. Visualized upper cervical spine and soft tissues are normal. Sinuses/Orbits:Right maxillary sinus opacification. The orbits are normal. IMPRESSION: 1. Small focus of acute ischemia within the right cerebral peduncle. No hemorrhage or mass effect. 2. Chronic microangiopathic white matter changes. Electronically Signed   By: Juanetta Nordmann M.D.   On: 07/06/2023 02:52   CT ANGIO HEAD NECK W WO CM Result Date: 07/05/2023 CLINICAL DATA:   Acute neurologic deficit.  Right facial droop. EXAM: CT ANGIOGRAPHY HEAD AND NECK WITH AND WITHOUT CONTRAST TECHNIQUE: Multidetector CT imaging of the head and neck was performed using the standard protocol during bolus administration of intravenous contrast. Multiplanar CT image reconstructions and MIPs were obtained to evaluate the vascular anatomy. Carotid stenosis measurements (when applicable) are obtained utilizing NASCET criteria, using the distal internal carotid diameter as the denominator. RADIATION DOSE REDUCTION: This exam was performed according to the departmental dose-optimization program which includes automated exposure control, adjustment of the mA and/or kV according to patient size and/or use of iterative reconstruction technique. CONTRAST:  75mL OMNIPAQUE  IOHEXOL  350 MG/ML SOLN COMPARISON:  None Available. FINDINGS: CTA NECK FINDINGS Skeleton: No acute abnormality or high grade bony spinal canal stenosis. Other neck: Normal pharynx, larynx and major salivary glands. No cervical lymphadenopathy. Unremarkable thyroid  gland. Upper chest: No pneumothorax or pleural effusion. No nodules or masses. Aortic arch: There is no calcific atherosclerosis of the aortic arch. Normal variant aortic arch branching pattern with the brachiocephalic and left common carotid arteries sharing a common origin. RIGHT carotid system: Normal without aneurysm, dissection or stenosis. LEFT carotid system: Normal without aneurysm, dissection or stenosis. Vertebral arteries: Left dominant configuration. There is no dissection, occlusion or flow-limiting stenosis to the skull base (V1-V3 segments). CTA HEAD FINDINGS POSTERIOR CIRCULATION: Vertebral arteries are normal. No proximal occlusion of the anterior or inferior cerebellar arteries. Basilar artery is normal. Superior cerebellar arteries are normal. 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 The left PCA is patent. ANTERIOR CIRCULATION: There is atherosclerotic  calcification of the right carotid terminus with severe, near occlusive stenosis. Anterior cerebral arteries are normal. Middle cerebral arteries  are normal. Venous sinuses: As permitted by contrast timing, patent. Anatomic variants: None Review of the MIP images confirms the above findings. IMPRESSION: 1. 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 2. 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 3. No hemodynamically significant stenosis of the carotid or vertebral arteries in the neck. Critical Value/emergent results were called by telephone at the time of interpretation on 07/05/2023 at 12:30 am to provider APRIL PALUMBO , who verbally acknowledged these results. Electronically Signed   By: Juanetta Nordmann M.D.   On: 07/05/2023 00:30   CT HEAD WO CONTRAST Result Date: 07/04/2023 CLINICAL DATA:  Right-sided facial droop and altered mental status EXAM: CT HEAD WITHOUT CONTRAST TECHNIQUE: Contiguous axial images were obtained from the base of the skull through the vertex without intravenous contrast. RADIATION DOSE REDUCTION: This exam was performed according to the departmental dose-optimization program which includes automated exposure control, adjustment of the mA and/or kV according to patient size and/or use of iterative reconstruction technique. COMPARISON:  None Available. FINDINGS: Brain: No mass,hemorrhage or extra-axial collection. Normal appearance of the parenchyma and CSF spaces. Vascular: No hyperdense vessel or unexpected vascular calcification. Skull: The visualized skull base, calvarium and extracranial soft tissues are normal. Sinuses/Orbits: Ethmoid and maxillary sinus mucosal thickening. Normal orbits. Other: None. IMPRESSION: No acute intracranial abnormality. Electronically Signed   By: Juanetta Nordmann M.D.   On: 07/04/2023 23:06   DG Chest 2 View Result Date: 06/20/2023 CLINICAL DATA:  Chest pain. EXAM: CHEST - 2 VIEW COMPARISON:  Chest radiograph dated 04/08/2020 FINDINGS:  There is mild vascular congestion. Faint density in the right mid lung field may represent edema or developing infiltrate. No pleural effusion or pneumothorax. The cardiac silhouette is within limits. No acute osseous pathology. IMPRESSION: 1. Mild vascular congestion. 2. Faint density in the right mid lung field may represent edema or developing infiltrate. Electronically Signed   By: Angus Bark M.D.   On: 06/20/2023 18:55    Micro Results    No results found for this or any previous visit (from the past 240 hours).  Today   Subjective    Younes Polyak today has no headache,no chest abdominal pain,no new weakness tingling or numbness, feels much better wants to go home today.    Objective   Blood pressure (!) 150/90, pulse 60, temperature 98.1 F (36.7 C), temperature source Oral, resp. rate 18, height 6\' 1"  (1.854 m), weight (!) 153.8 kg, SpO2 97%.  No intake or output data in the 24 hours ending 07/06/23 1107  Exam  Awake Alert, No new F.N deficits,    [place].AT,PERRAL Supple Neck,   Symmetrical Chest wall movement, Good air movement bilaterally, CTAB RRR,No Gallops,   +ve B.Sounds, Abd Soft, Non tender,  No Cyanosis, Clubbing or edema    Data Review   Recent Labs  Lab 07/04/23 2214 07/06/23 0430  WBC 7.4 7.2  HGB 12.6* 12.8*  HCT 37.6* 38.2*  PLT 354 367  MCV 80.5 79.7*  MCH 27.0 26.7  MCHC 33.5 33.5  RDW 13.1 12.9  LYMPHSABS 2.7  --   MONOABS 0.6  --   EOSABS 0.3  --   BASOSABS 0.0  --     Recent Labs  Lab 07/04/23 1249 07/04/23 2214 07/06/23 0430  NA 141 139 140  K 3.9 3.0* 2.8*  CL 102 102 102  CO2 23 27 26   ANIONGAP  --  10 12  GLUCOSE 82 93 111*  BUN 11 12 10  CREATININE 1.02 1.18 1.01  AST  --  29  --   ALT  --  39  --   ALKPHOS  --  80  --   BILITOT  --  0.2  --   ALBUMIN  --  4.2  --   INR  --  1.0  --   TSH  --   --  1.346  HGBA1C  --   --  6.2*  MG  --   --  2.1  PHOS  --   --  2.9  CALCIUM  8.9 9.2 9.1    Total Time in  preparing paper work, data evaluation and todays exam - 35 minutes  Signature  -    Lynnwood Sauer M.D on 07/06/2023 at 11:07 AM   -  To page go to [email or web address]

## 2023-07-06 NOTE — Plan of Care (Signed)
  Problem: Education: Goal: Knowledge of General Education information will improve Description: Including pain rating scale, medication(s)/side effects and non-pharmacologic comfort measures Outcome: Completed/Met   Problem: Health Behavior/Discharge Planning: Goal: Ability to manage health-related needs will improve Outcome: Completed/Met   Problem: Clinical Measurements: Goal: Ability to maintain clinical measurements within normal limits will improve Outcome: Completed/Met Goal: Will remain free from infection Outcome: Completed/Met Goal: Diagnostic test results will improve Outcome: Completed/Met Goal: Cardiovascular complication will be avoided Outcome: Completed/Met   Problem: Activity: Goal: Risk for activity intolerance will decrease Outcome: Completed/Met   Problem: Nutrition: Goal: Adequate nutrition will be maintained Outcome: Completed/Met   Problem: Coping: Goal: Level of anxiety will decrease Outcome: Completed/Met   Problem: Elimination: Goal: Will not experience complications related to bowel motility Outcome: Completed/Met Goal: Will not experience complications related to urinary retention Outcome: Completed/Met   Problem: Pain Managment: Goal: General experience of comfort will improve and/or be controlled Outcome: Completed/Met   Problem: Safety: Goal: Ability to remain free from injury will improve Outcome: Completed/Met   Problem: Skin Integrity: Goal: Risk for impaired skin integrity will decrease Outcome: Completed/Met   Problem: Education: Goal: Knowledge of disease or condition will improve Outcome: Completed/Met Goal: Knowledge of secondary prevention will improve (MUST DOCUMENT ALL) Outcome: Completed/Met Goal: Knowledge of patient specific risk factors will improve (DELETE if not current risk factor) Outcome: Completed/Met   Problem: Ischemic Stroke/TIA Tissue Perfusion: Goal: Complications of ischemic stroke/TIA will be  minimized Outcome: Completed/Met   Problem: Coping: Goal: Will verbalize positive feelings about self Outcome: Completed/Met Goal: Will identify appropriate support needs Outcome: Completed/Met   Problem: Health Behavior/Discharge Planning: Goal: Ability to manage health-related needs will improve Outcome: Completed/Met Goal: Goals will be collaboratively established with patient/family Outcome: Completed/Met   Problem: Self-Care: Goal: Ability to participate in self-care as condition permits will improve Outcome: Completed/Met Goal: Verbalization of feelings and concerns over difficulty with self-care will improve Outcome: Completed/Met Goal: Ability to communicate needs accurately will improve Outcome: Completed/Met   Problem: Nutrition: Goal: Risk of aspiration will decrease Outcome: Completed/Met Goal: Dietary intake will improve Outcome: Completed/Met

## 2023-07-06 NOTE — Progress Notes (Signed)
 Physical Therapy Note  Reports symptoms resolved, feeling back to baseline, surprised by Dx. Patient is functioning at a high level of independence and no physical therapy is indicated at this time. 56/56 BERG; Low fall risk. No focal deficits noted this AM. FNF, HKS WNL. Ambulates independently without signs of LOB. Educated on BEFAST acronym. PT is signing-off. Please re-order if there is any significant change in status. Thank you for this referral.  Jory Ng, PT, DPT [facility]  Rehabilitation Services Physical Therapist Office: [phone] Website: [email or web address]

## 2023-07-06 NOTE — Assessment & Plan Note (Signed)
-   will replace electrolytes and repeat  check Mg, phos and Ca level and replace as needed Monitor on telemetry   Lab Results  Component Value Date   K 3.0 (L) 07/04/2023     Lab Results  Component Value Date   CREATININE 1.18 07/04/2023   No results found for: "MG" Lab Results  Component Value Date   CALCIUM  9.2 07/04/2023

## 2023-07-06 NOTE — Progress Notes (Addendum)
 STROKE TEAM PROGRESS NOTE    INTERIM HISTORY/SUBJECTIVE Patient has remote history of A-fib more than 10 years ago but he feels this was likely provoked by some medications.  He did take anticoagulation for short time and it was discontinued by his physician.  He denies any intermittent history of palpitations.  MRI scan of the brain shows what looks like a silent right cerebral peduncle infarct.  CT angiogram shows terminal right ICA high-grade stenosis and occlusion of the right PCA at its origin. Patient remains a hemodynamically stable and afebrile.  He is ready to be discharged today.  OBJECTIVE  CBC    Component Value Date/Time   WBC 7.2 07/06/2023 0430   RBC 4.79 07/06/2023 0430   RBC 4.75 07/06/2023 0430   HGB 12.8 (L) 07/06/2023 0430   HCT 38.2 (L) 07/06/2023 0430   PLT 367 07/06/2023 0430   MCV 79.7 (L) 07/06/2023 0430   MCH 26.7 07/06/2023 0430   MCHC 33.5 07/06/2023 0430   RDW 12.9 07/06/2023 0430   LYMPHSABS 2.7 07/04/2023 2214   MONOABS 0.6 07/04/2023 2214   EOSABS 0.3 07/04/2023 2214   BASOSABS 0.0 07/04/2023 2214    BMET    Component Value Date/Time   NA 140 07/06/2023 0430   NA 141 07/04/2023 1249   K 2.8 (L) 07/06/2023 0430   CL 102 07/06/2023 0430   CO2 26 07/06/2023 0430   GLUCOSE 111 (H) 07/06/2023 0430   BUN 10 07/06/2023 0430   BUN 11 07/04/2023 1249   CREATININE 1.01 07/06/2023 0430   CREATININE 1.07 05/08/2022 1108   CALCIUM  9.1 07/06/2023 0430   EGFR 94 07/04/2023 1249   GFRNONAA >60 07/06/2023 0430    IMAGING past 24 hours ECHOCARDIOGRAM COMPLETE Result Date: 07/06/2023    ECHOCARDIOGRAM REPORT   Patient Name:   Zachary Cruz Date of Exam: 07/06/2023 Medical Rec #:  [id number]           Height:       73.0 in Accession #:    [id number]          Weight:       339.0 lb Date of Birth:  23-Sep-1980           BSA:          2.693 m Patient Age:    42 years            BP:           150/90 mmHg Patient Gender: M                   HR:           64  bpm. Exam Location:  Inpatient Procedure: 2D Echo, Cardiac Doppler, Color Doppler and Saline Contrast Bubble            Study (Both Spectral and Color Flow Doppler were utilized during            procedure). Indications:    TIA  History:        Patient has no prior history of Echocardiogram examinations.                 Arrythmias:Atrial Fibrillation; Risk Factors:Hypertension and                 Sleep Apnea.  Sonographer:    Astrid Blamer Referring Phys: [id number] ANASTASSIA DOUTOVA IMPRESSIONS  1. Left ventricular ejection fraction, by estimation, is 55%. The left ventricle has normal function. The left  ventricle has no regional wall motion abnormalities. Left ventricular diastolic parameters were normal.  2. Right ventricular systolic function was not well visualized. The right ventricular size is not well visualized. Tricuspid regurgitation signal is inadequate for assessing PA pressure.  3. Bubble study was probably negative but images very poor.  4. The mitral valve is normal in structure. Trivial mitral valve regurgitation. No evidence of mitral stenosis.  5. The aortic valve was not well visualized. Aortic valve regurgitation is not visualized. No aortic stenosis is present.  6. The IVC was not visualized.  7. Technically difficult study with poor acoustic windows. FINDINGS  Left Ventricle: Left ventricular ejection fraction, by estimation, is 55%. The left ventricle has normal function. The left ventricle has no regional wall motion abnormalities. The left ventricular internal cavity size was normal in size. There is no left ventricular hypertrophy. Left ventricular diastolic parameters were normal. Right Ventricle: The right ventricular size is not well visualized. Right vetricular wall thickness was not well visualized. Right ventricular systolic function was not well visualized. Tricuspid regurgitation signal is inadequate for assessing PA pressure. Left Atrium: Left atrial size was normal in size. Right  Atrium: Right atrial size was not well visualized. Pericardium: There is no evidence of pericardial effusion. Mitral Valve: The mitral valve is normal in structure. Trivial mitral valve regurgitation. No evidence of mitral valve stenosis. Tricuspid Valve: The tricuspid valve is normal in structure. Tricuspid valve regurgitation is not demonstrated. Aortic Valve: The aortic valve was not well visualized. Aortic valve regurgitation is not visualized. No aortic stenosis is present. Aortic valve mean gradient measures 6.0 mmHg. Aortic valve peak gradient measures 12.0 mmHg. Aortic valve area, by VTI measures 3.08 cm. Pulmonic Valve: The pulmonic valve was normal in structure. Pulmonic valve regurgitation is not visualized. Aorta: The aortic root is normal in size and structure. Venous: The IVC was not visualized. IAS/Shunts: Bubble study was probably negative but images very poor. Agitated saline contrast was given intravenously to evaluate for intracardiac shunting.  LEFT VENTRICLE PLAX 2D LVIDd:         6.00 cm   Diastology LVIDs:         4.10 cm   LV e' medial:    8.70 cm/s LV PW:         1.00 cm   LV E/e' medial:  9.3 LV IVS:        1.00 cm   LV e' lateral:   10.60 cm/s LVOT diam:     2.00 cm   LV E/e' lateral: 7.6 LV SV:         104 LV SV Index:   39 LVOT Area:     3.14 cm  LEFT ATRIUM             Index LA Vol (A2C):   59.1 ml 21.95 ml/m LA Vol (A4C):   46.0 ml 17.08 ml/m LA Biplane Vol: 52.2 ml 19.39 ml/m  AORTIC VALVE AV Area (Vmax):    2.74 cm AV Area (Vmean):   3.06 cm AV Area (VTI):     3.08 cm AV Vmax:           173.00 cm/s AV Vmean:          111.000 cm/s AV VTI:            0.339 m AV Peak Grad:      12.0 mmHg AV Mean Grad:      6.0 mmHg LVOT Vmax:  151.00 cm/s LVOT Vmean:        108.000 cm/s LVOT VTI:          0.332 m LVOT/AV VTI ratio: 0.98  AORTA Ao Root diam: 3.20 cm MITRAL VALVE MV Area (PHT): 3.95 cm    SHUNTS MV Decel Time: 192 msec    Systemic VTI:  0.33 m MV E velocity: 80.80 cm/s   Systemic Diam: 2.00 cm MV A velocity: 61.30 cm/s MV E/A ratio:  1.32 Dalton McleanMD Electronically signed by Archer Bear Signature Date/Time: 07/06/2023/9:13:05 AM    Final    DG CHEST PORT 1 VIEW Result Date: 07/06/2023 CLINICAL DATA:  TIA EXAM: PORTABLE CHEST 1 VIEW COMPARISON:  06/20/2023 FINDINGS: Normal heart size and mediastinal contours. No acute infiltrate or edema. No effusion or pneumothorax. No acute osseous findings. Artifact from EKG leads. IMPRESSION: No active disease. Electronically Signed   By: Ronnette Coke M.D.   On: 07/06/2023 04:30   MR BRAIN WO CONTRAST Result Date: 07/06/2023 CLINICAL DATA:  Transient ischemic attack EXAM: MRI HEAD WITHOUT CONTRAST TECHNIQUE: Multiplanar, multiecho pulse sequences of the brain and surrounding structures were obtained without intravenous contrast. COMPARISON:  None Available. FINDINGS: Brain: Small focus of acute ischemia within the right cerebral peduncle. No acute or chronic hemorrhage. There is multifocal hyperintense T2-weighted signal within the white matter. Parenchymal volume and CSF spaces are normal. The midline structures are normal. Vascular: Normal flow voids. Skull and upper cervical spine: Normal calvarium and skull base. Visualized upper cervical spine and soft tissues are normal. Sinuses/Orbits:Right maxillary sinus opacification. The orbits are normal. IMPRESSION: 1. Small focus of acute ischemia within the right cerebral peduncle. No hemorrhage or mass effect. 2. Chronic microangiopathic white matter changes. Electronically Signed   By: Juanetta Nordmann M.D.   On: 07/06/2023 02:52    Vitals:   07/05/23 2000 07/05/23 2052 07/05/23 2243 07/06/23 0342  BP:  (!) 146/93  (!) 150/90  Pulse: 71 63 61 60  Resp: 17 20 18 18   Temp:   97.7 F (36.5 C) 98.1 F (36.7 C)  TempSrc:   Oral Oral  SpO2: 98% 99% 96% 97%  Weight:      Height:         PHYSICAL EXAM General:  Alert, well-nourished, well-developed patient in no acute  distress Psych:  Mood and affect appropriate for situation CV: Regular rate and rhythm on monitor Respiratory:  Regular, unlabored respirations on room air GI: Abdomen soft and nontender   NEURO:  Mental Status: AA&Ox3, patient is able to give clear and coherent history Speech/Language: speech is without dysarthria or aphasia.    Cranial Nerves:  II: PERRL. Visual fields full.  III, IV, VI: EOMI. Eyelids elevate symmetrically.  V: Sensation is intact to light touch and symmetrical to face.  VII: Face is symmetrical resting and smiling VIII: hearing intact to voice. IX, X: Phonation is normal.  ZO:[id number] shrug 5/5. XII: tongue is midline without fasciculations. Motor: 5/5 strength to all muscle groups tested.  Tone: is normal and bulk is normal Sensation- Intact to light touch bilaterally.  Coordination: FTN intact bilaterally Gait- deferred  Most Recent NIH 0   ASSESSMENT/PLAN  Zachary Cruz is a 43 y.o. male with history of hypertension, obesity and sleep apnea admitted for an episode of left facial droop, dysarthria, dysphagia and inability to swallow his food.  MRI reveals small acute infarct in right cerebral peduncle.  Patient does have a history of atrial fibrillation, but on further questioning,  this was a one-time episode caused by a medication interaction, and patient's cardiologist has taken him off of anticoagulation.  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  CTA head & neck fetal origin of the right PCA, stenosis of right carotid terminus MRI small focus of acute ischemia within right cerebral peduncle, chronic microvascular white matter changes 2D Echo 55%, bubble study probably negative but images poor, left atrium normal in size 30-day cardiac monitoring upon discharge LDL 104 HgbA1c 6.2 VTE prophylaxis -SCDs No antithrombotic prior to  admission, now on aspirin  81 mg daily and clopidogrel  75 mg daily for 3 weeks and then aspirin  alone. Therapy recommendations:  Pending Disposition: Home   Atrial fibrillation Home Meds: None Continue telemetry monitoring Patient has a history of 1 episode of atrial fibrillation in 2015 which was per him caused by a medication interaction.  He was on anticoagulation for a short time, but this was discontinued by his cardiologist. Given history of episode of A-fib and new stroke, will provide patient with 30-day cardiac monitor at discharge  Hypertension Home meds: Chlorthalidone  25 mg daily, Nebivolol  20 mg daily, olmesartan  40 mg daily Stable Blood Pressure Goal: BP less than 220/110   Hyperlipidemia Home meds: None LDL 104, goal < 70 Add rosuvastatin  20 mg daily Continue statin at discharge  Other Stroke Risk Factors Obesity, Body mass index is 44.73 kg/m., BMI >/= 30 associated with increased stroke risk, recommend weight loss, diet and exercise as appropriate    Other Active Problems None  Hospital day # 0  Patient seen by NP with MD, MD to edit note as needed. Cortney E Bucky Cardinal , MSN, AGACNP-BC [facility] See Amion for schedule and pager information 07/06/2023 3:49 PM   I have personally obtained history,examined this patient, reviewed notes, independently viewed imaging studies, participated in medical decision making and plan of care.ROS completed by me personally and pertinent positives fully documented  I have made any additions or clarifications directly to the above note. Agree with note above.  Patient presented with left facial droop, slurred speech and swallowing difficulties which appear to have improved.  MRI shows a right cerebral peduncle small lacunar infarct and CT angiogram showed asymptomatic right PCA occlusion and high-grade terminal right ICA stenosis.  He has a remote history of atrial fibrillation 10 years ago for which he was shortly  on anticoagulation and peripheral discontinued by his physician.  I do not know the details.  The current infarct appears to be lacunar and may not be related to A-fib however we need to obtain details of his A-fib from his cardiologist.  Recommend antiplatelet therapy for now if his A-fib is considered to be high risk may switch to anticoagulation later as an outpatient after approval from his cardiologist.  Aspirin  and Plavix  for 3 weeks from now followed by aspirin  alone and aggressive risk factor modification.  Long discussion with patient and answered questions.  Discussed with Dr. Zelda Hickman and Dr. Berry Bristol Greater than 50% time during this 50-minute visit was spent in counseling and coordination of care about his lacunar stroke and discussion about remote A-fib history and need to obtain more information to clarify need for long-term anticoagulation and answering questions. Ardella Beaver, MD Medical Director [facility] Pager: [phone] 07/06/2023 5:08 PM  To contact Stroke Continuity provider, please refer to [email or web address]. After hours, contact General Neurology

## 2023-07-06 NOTE — Discharge Instructions (Addendum)
 Follow with Primary MD [name], NP in 7 days   Get CBC, CMP, magnesium-  checked next visit with your primary MD   Activity: As tolerated with Full fall precautions use walker/cane & assistance as needed  Disposition Home    Diet: Heart Healthy    Special Instructions: If you have smoked or chewed Tobacco  in the last 2 yrs please stop smoking, stop any regular Alcohol  and or any Recreational drug use.  On your next visit with your primary care physician please Get Medicines reviewed and adjusted.  Please request your Prim.MD to go over all Hospital Tests and Procedure/Radiological results at the follow up, please get all Hospital records sent to your Prim MD by signing hospital release before you go home.  If you experience worsening of your admission symptoms, develop shortness of breath, life threatening emergency, suicidal or homicidal thoughts you must seek medical attention immediately by calling 911 or calling your MD immediately  if symptoms less severe.  You Must read complete instructions/literature along with all the possible adverse reactions/side effects for all the Medicines you take and that have been prescribed to you. Take any new Medicines after you have completely understood and accpet all the possible adverse reactions/side effects.   Do not drive when taking Pain medications.  Do not take more than prescribed Pain, Sleep and Anxiety Medications  Wear Seat belts while driving.

## 2023-07-06 NOTE — TOC Transition Note (Signed)
 Transition of Care [facility]) - Discharge Note   Patient Details  Name: Zachary Cruz MRN: [id number] Date of Birth: 1980/04/12  [facility] of [facility]) CM/SW Contact:  Eusebio High, RN Phone Number: 07/06/2023, 11:08 AM   Clinical Narrative:     Patient will DC to home today. No TOC needs identified. Patient will follow up as directed on AVS    Family to transport           Patient Goals and CMS Choice            Discharge Placement                       Discharge Plan and Services Additional resources added to the After Visit Summary for                                       Social Drivers of Health (SDOH) Interventions SDOH Screenings   Food Insecurity: No Food Insecurity (07/05/2023)  Housing: Low Risk  (07/05/2023)  Transportation Needs: No Transportation Needs (07/05/2023)  Utilities: Not At Risk (07/05/2023)  Depression (PHQ2-9): Medium Risk (07/04/2023)  Social Connections: Moderately Integrated (07/05/2023)  Tobacco Use: Medium Risk (07/05/2023)     Readmission Risk Interventions     No data to display

## 2023-07-06 NOTE — Progress Notes (Signed)
 OT Screen Note  Patient Details Name: Zachary Cruz MRN: [id number] DOB: 04-27-1980   Cancelled Treatment:    Reason Eval/Treat Not Completed: OT screened, no needs identified, will sign off Per PT, patient with no deficits and no need for acute OT evaluation. OT will sign off at this time.   Mollie Anger E. Shawnice Tilmon, OTR/L Acute Rehabilitation Services [phone]   Vincent Greek 07/06/2023, 11:21 AM

## 2023-07-08 LAB — DRUG PROFILE, UR, 9 DRUGS (LABCORP)
Amphetamines, Urine: NEGATIVE ng/mL
Barbiturate, Ur: NEGATIVE ng/mL
Benzodiazepine Quant, Ur: NEGATIVE ng/mL
Cannabinoid Quant, Ur: NEGATIVE ng/mL
Cocaine (Metab.): NEGATIVE ng/mL
Creatinine, Urine: 152.3 mg/dL (ref 20.0–300.0)
Methadone Screen, Urine: NEGATIVE ng/mL
Nitrite Urine, Quantitative: NEGATIVE ug/mL
OPIATE SCREEN URINE: NEGATIVE ng/mL
Phencyclidine, Ur: NEGATIVE ng/mL
Propoxyphene, Urine: NEGATIVE ng/mL
pH, Urine: 5.6 (ref 4.5–8.9)

## 2023-07-10 ENCOUNTER — Encounter: Payer: Self-pay | Admitting: Medical-Surgical

## 2023-07-10 ENCOUNTER — Ambulatory Visit (INDEPENDENT_AMBULATORY_CARE_PROVIDER_SITE_OTHER): Admitting: Medical-Surgical

## 2023-07-10 VITALS — BP 138/80 | HR 53 | Resp 20 | Ht 73.0 in | Wt 332.7 lb

## 2023-07-10 DIAGNOSIS — G459 Transient cerebral ischemic attack, unspecified: Secondary | ICD-10-CM

## 2023-07-10 DIAGNOSIS — Z09 Encounter for follow-up examination after completed treatment for conditions other than malignant neoplasm: Secondary | ICD-10-CM | POA: Diagnosis not present

## 2023-07-10 DIAGNOSIS — I4891 Unspecified atrial fibrillation: Secondary | ICD-10-CM | POA: Diagnosis not present

## 2023-07-10 DIAGNOSIS — K5120 Ulcerative (chronic) proctitis without complications: Secondary | ICD-10-CM | POA: Diagnosis not present

## 2023-07-10 NOTE — Progress Notes (Signed)
 Established patient visit  History, exam, impression, and plan:  1. Hospital discharge follow-up (Primary) Very pleasant 43 year old male presenting today with reports of strokelike symptoms that occurred after leaving his doctors appointment with me on 07/04/2023.  When he was leaving our office, he remembers laughing at a joke while in the car.  When he tried to stop smiling, the left side of his face returned to normal however the right side of his face continued to smile and would not release the muscles for a brief period of time.  Once he returned home, he notes that he was very tired and ended up taking a 3-hour nap.  After waking to go get the kids, he ended up taking another nap due to profound exhaustion.  When he woke to eat dinner, he reports feeling like the food was stuck in the back of his throat and he was unable to swallow.  At that time he became concerned and drove himself to the ED.  At the ED, he had a full workup which showed atrial fibrillation as well as strokelike symptoms.  He was kept in the hospital for approximately 48 hours before being discharged home with referrals to neurology and cardiology.  His medications were changed and he is now on Plavix , chlorthalidone , Nebivolol , olmesartan , and Crestor .  He is taking a baby aspirin  once daily.  Today reports that he feels well overall and has had no further neurological issues.  He does have some anxiety when trying to go to bed at night but is otherwise feeling back to his normal self.  Per hospital discharge summary, he is to see PCP in 1-2 weeks with a repeat of CBC and BMP.  We are slightly early for that so orders placed to have him come back on Friday or early next week to have these done.  They also recommended a repeat chest x-ray so order has been placed for this for him to come back and have this done as well.  Patient verbalized understanding and is agreeable to the plan. - CBC - Basic Metabolic Panel (BMET) - DG  Chest 2 View; Future  2. Atrial fibrillation, unspecified type (HCC) A-fib noted on evaluation at the emergency room.  He is currently on Nebivolol  and Plavix .  Tolerating both medications well without side effects.  Denies any concerning symptoms today including chest pain, shortness of breath, activity intolerance, palpitations, and dizziness/lightheadedness.  On evaluation, HRRR, S1/S2 normal.  Blood pressure is elevated on arrival, slightly better on recheck.  He has a plan to follow-up with cardiology next week.  No change in medications at this time as he would like to wait until he sees cardiology.  3. TIA (transient ischemic attack) As noted above, was diagnosed with a TIA and has been connected with neurology and cardiology.  Denies any concerning symptoms at this time.  Neuroexam is normal.  Continue Plavix  75 mg daily and rosuvastatin  20 mg daily as prescribed.  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  Admits that he would prefer to keep his care in [facility] for ease of access.  He is currently on mesalamine for ulcerative proctitis.  After discussion, placing referral to GI to connect him with a [place] gastroenterologist. - Ambulatory referral to Gastroenterology   Procedures performed this visit: None.  Return if symptoms worsen or fail to improve.  __________________________________ Maryl Snook, DNP, APRN,  FNP-BC Primary Care and Sports Medicine [facility]

## 2023-07-15 ENCOUNTER — Emergency Department (HOSPITAL_COMMUNITY)
Admission: EM | Admit: 2023-07-15 | Discharge: 2023-07-16 | Disposition: A | Discharge status: Home / Self Care | Payer: Self-pay | Attending: Emergency Medicine | Admitting: Emergency Medicine

## 2023-07-15 ENCOUNTER — Encounter (HOSPITAL_COMMUNITY): Payer: Self-pay

## 2023-07-15 ENCOUNTER — Emergency Department (HOSPITAL_COMMUNITY): Payer: Self-pay

## 2023-07-15 ENCOUNTER — Other Ambulatory Visit: Payer: Self-pay

## 2023-07-15 DIAGNOSIS — R61 Generalized hyperhidrosis: Secondary | ICD-10-CM | POA: Insufficient documentation

## 2023-07-15 DIAGNOSIS — I10 Essential (primary) hypertension: Secondary | ICD-10-CM | POA: Insufficient documentation

## 2023-07-15 DIAGNOSIS — Z7982 Long term (current) use of aspirin: Secondary | ICD-10-CM | POA: Insufficient documentation

## 2023-07-15 DIAGNOSIS — R202 Paresthesia of skin: Secondary | ICD-10-CM | POA: Diagnosis present

## 2023-07-15 DIAGNOSIS — E876 Hypokalemia: Secondary | ICD-10-CM

## 2023-07-15 DIAGNOSIS — R5383 Other fatigue: Secondary | ICD-10-CM | POA: Insufficient documentation

## 2023-07-15 DIAGNOSIS — R6889 Other general symptoms and signs: Secondary | ICD-10-CM

## 2023-07-15 DIAGNOSIS — Z79899 Other long term (current) drug therapy: Secondary | ICD-10-CM | POA: Insufficient documentation

## 2023-07-15 HISTORY — DX: Cerebral infarction, unspecified: I63.9

## 2023-07-15 LAB — COMPREHENSIVE METABOLIC PANEL WITH GFR
ALT: 53 U/L — ABNORMAL HIGH (ref 0–44)
AST: 41 U/L (ref 15–41)
Albumin: 3.9 g/dL (ref 3.5–5.0)
Alkaline Phosphatase: 64 U/L (ref 38–126)
Anion gap: 12 (ref 5–15)
BUN: 11 mg/dL (ref 6–20)
CO2: 25 mmol/L (ref 22–32)
Calcium: 9.4 mg/dL (ref 8.9–10.3)
Chloride: 103 mmol/L (ref 98–111)
Creatinine, Ser: 1.27 mg/dL — ABNORMAL HIGH (ref 0.61–1.24)
GFR, Estimated: >60 mL/min (ref >60)
Glucose, Bld: 105 mg/dL — ABNORMAL HIGH (ref 70–99)
Potassium: 2.8 mmol/L — ABNORMAL LOW (ref 3.5–5.1)
Sodium: 140 mmol/L (ref 135–145)
Total Bilirubin: 0.5 mg/dL (ref 0.0–1.2)
Total Protein: 7.7 g/dL (ref 6.5–8.1)

## 2023-07-15 LAB — CBC
HCT: 41.5 % (ref 39.0–52.0)
Hemoglobin: 13.9 g/dL (ref 13.0–17.0)
MCH: 27 pg (ref 26.0–34.0)
MCHC: 33.5 g/dL (ref 30.0–36.0)
MCV: 80.7 fL (ref 80.0–100.0)
Platelets: 360 10*3/uL (ref 150–400)
RBC: 5.14 MIL/uL (ref 4.22–5.81)
RDW: 12.5 % (ref 11.5–15.5)
WBC: 8.6 10*3/uL (ref 4.0–10.5)
nRBC: 0 % (ref 0.0–0.2)

## 2023-07-15 LAB — I-STAT CHEM 8, ED
BUN: 12 mg/dL (ref 6–20)
Calcium, Ion: 1.16 mmol/L (ref 1.15–1.40)
Chloride: 102 mmol/L (ref 98–111)
Creatinine, Ser: 1.3 mg/dL — ABNORMAL HIGH (ref 0.61–1.24)
Glucose, Bld: 106 mg/dL — ABNORMAL HIGH (ref 70–99)
HCT: 43 % (ref 39.0–52.0)
Hemoglobin: 14.6 g/dL (ref 13.0–17.0)
Potassium: 2.8 mmol/L — ABNORMAL LOW (ref 3.5–5.1)
Sodium: 142 mmol/L (ref 135–145)
TCO2: 25 mmol/L (ref 22–32)

## 2023-07-15 LAB — DIFFERENTIAL
Abs Immature Granulocytes: 0.01 10*3/uL (ref 0.00–0.07)
Basophils Absolute: 0.1 10*3/uL (ref 0.0–0.1)
Basophils Relative: 1 %
Eosinophils Absolute: 0.4 10*3/uL (ref 0.0–0.5)
Eosinophils Relative: 4 %
Immature Granulocytes: 0 %
Lymphocytes Relative: 44 %
Lymphs Abs: 3.8 10*3/uL (ref 0.7–4.0)
Monocytes Absolute: 0.6 10*3/uL (ref 0.1–1.0)
Monocytes Relative: 7 %
Neutro Abs: 3.8 10*3/uL (ref 1.7–7.7)
Neutrophils Relative %: 44 %

## 2023-07-15 LAB — PROTIME-INR
INR: 1.1 (ref 0.8–1.2)
Prothrombin Time: 14.2 s (ref 11.4–15.2)

## 2023-07-15 LAB — ETHANOL: Alcohol, Ethyl (B): <15 mg/dL (ref <15)

## 2023-07-15 LAB — APTT: aPTT: 29 s (ref 24–36)

## 2023-07-15 LAB — CBG MONITORING, ED: Glucose-Capillary: 107 mg/dL — ABNORMAL HIGH (ref 70–99)

## 2023-07-15 MED ORDER — SODIUM CHLORIDE 0.9% FLUSH: 3.0000 mL | Freq: Once | INTRAVENOUS | Status: DC

## 2023-07-15 MED ORDER — LACTATED RINGERS IV BOLUS
1000.0000 mL | Freq: Once | INTRAVENOUS | Status: AC
  Administered 2023-07-15: 1000 mL via INTRAVENOUS

## 2023-07-15 MED ORDER — POTASSIUM CHLORIDE 10 MEQ/100ML IV SOLN
10.0000 meq | Freq: Once | INTRAVENOUS | Status: AC
  Administered 2023-07-15: 10 meq via INTRAVENOUS
  Filled 2023-07-15: qty 100

## 2023-07-15 MED ORDER — POTASSIUM CHLORIDE CRYS ER 20 MEQ PO TBCR
40.0000 meq | EXTENDED_RELEASE_TABLET | Freq: Once | ORAL | Status: AC
Start: 2023-07-15 — End: 2023-07-15
  Administered 2023-07-15: 40 meq via ORAL
  Filled 2023-07-15: qty 2

## 2023-07-15 NOTE — ED Triage Notes (Signed)
 Pt c.o posterior head numbness that started around noon today along with fatigue and bilateral leg weakness. Pt states he was diagnosed with a stroke 2 weeks ago. No neuro deficits noted in triage at this time

## 2023-07-15 NOTE — ED Provider Notes (Signed)
 [place] [facility] DEPARTMENT AT [facility] Provider Note   CSN: [id number] Arrival date & time: 07/15/23  2019     History  Chief Complaint  Patient presents with   Numbness    Zachary Cruz is a 43 y.o. male.  Pt is a 42y/o male with hx of A-fib in the past not on eliquis , hypertension, obesity, sleep apnea, depression and recent admission 2 weeks ago for stroke d/ced with ASA and plavix  currently who had right facial spasm and dysphagia and fatigue that completely resolved who is presenting today with complaint of unusual sensation throughout the day today.  He reports when he woke up this morning he was feeling unusually fatigued but he then developed a weird sensation in the back of his head around noon or 1 today.  He reports it would start in the back of his head and then move into bilateral arms and thighs.  He described it as a cold somewhat tingling sensation that was not painful.  It would last for approximately 5 minutes or less and then completely resolved.  Since around noon today he has had about 5 episodes.  He has not had any issues with his face today or issues with his speech or swallowing.  He has been able to ambulate with no problems.  He has not had any unilateral numbness or weakness.  He denies any chest pain, shortness of breath, cough or fever.  No abdominal pain nausea or vomiting.  He denies any palpitations, near syncope or dizziness.  No visual changes.  He does report that he was outside earlier but he was not doing anything significantly exertional but felt that he was sweating excessively.  He has been eating and drinking normally.  The history is provided by the patient and medical records.       Home Medications Prior to Admission medications   Medication Sig Start Date End Date Taking? Authorizing Provider  aspirin  EC 81 MG tablet Take 1 tablet (81 mg total) by mouth daily. Swallow whole. 07/06/23 07/05/24 Yes Cala Castleman, MD   chlorthalidone  (HYGROTON ) 25 MG tablet Take 1 tablet (25 mg total) by mouth daily. 07/07/23  Yes Cala Castleman, MD  clopidogrel  (PLAVIX ) 75 MG tablet Take 1 tablet (75 mg total) by mouth daily for 21 days. 07/06/23 07/27/23 Yes Singh, Prashant K, MD  mesalamine (LIALDA) 1.2 g EC tablet Take 2.4 g by mouth daily with breakfast.   Yes [provider]  Nebivolol  HCl 20 MG TABS Take 1 tablet (20 mg total) by mouth daily. 07/07/23  Yes Cala Castleman, MD  olmesartan  (BENICAR ) 40 MG tablet Take 1 tablet (40 mg total) by mouth daily. 07/08/23  Yes Cala Castleman, MD  rosuvastatin  (CRESTOR ) 20 MG tablet Take 1 tablet (20 mg total) by mouth daily. 07/06/23  Yes Cala Castleman, MD      Allergies    Amlodipine     Review of Systems   Review of Systems  Physical Exam Updated Vital Signs BP (!) 168/108 (BP Location: Right Arm)   Pulse 84   Temp 98.6 F (37 C) (Oral)   Resp 18   Ht 6\' 1"  (1.854 m)   Wt (!) 150.1 kg   SpO2 100%   BMI 43.67 kg/m  Physical Exam Vitals and nursing note reviewed.  Constitutional:      General: He is not in acute distress.    Appearance: He is well-developed.  HENT:  Head: Normocephalic and atraumatic.  Eyes:     General: No visual field deficit.    Conjunctiva/sclera: Conjunctivae normal.     Pupils: Pupils are equal, round, and reactive to light.  Cardiovascular:     Rate and Rhythm: Normal rate and regular rhythm.     Heart sounds: No murmur heard. Pulmonary:     Effort: Pulmonary effort is normal. No respiratory distress.     Breath sounds: Normal breath sounds. No wheezing or rales.  Abdominal:     General: There is no distension.     Palpations: Abdomen is soft.     Tenderness: There is no abdominal tenderness. There is no guarding or rebound.  Musculoskeletal:        General: No tenderness. Normal range of motion.     Cervical back: Normal range of motion and neck supple.     Right lower leg: No edema.     Left lower leg: No  edema.  Skin:    General: Skin is warm and dry.     Findings: No erythema or rash.  Neurological:     Mental Status: He is alert and oriented to person, place, and time.     Cranial Nerves: No dysarthria or facial asymmetry.     Sensory: Sensation is intact. No sensory deficit.     Motor: No weakness or pronator drift.     Coordination: Finger-Nose-Finger Test and Heel to [place] Test normal.     Gait: Gait is intact. Gait normal.  Psychiatric:        Behavior: Behavior normal.     ED Results / Procedures / Treatments   Labs (all labs ordered are listed, but only abnormal results are displayed) Labs Reviewed  COMPREHENSIVE METABOLIC PANEL WITH GFR - Abnormal; Notable for the following components:      Result Value   Potassium 2.8 (*)    Glucose, Bld 105 (*)    Creatinine, Ser 1.27 (*)    ALT 53 (*)    All other components within normal limits  I-STAT CHEM 8, ED - Abnormal; Notable for the following components:   Potassium 2.8 (*)    Creatinine, Ser 1.30 (*)    Glucose, Bld 106 (*)    All other components within normal limits  CBG MONITORING, ED - Abnormal; Notable for the following components:   Glucose-Capillary 107 (*)    All other components within normal limits  PROTIME-INR  APTT  CBC  DIFFERENTIAL  ETHANOL    EKG EKG Interpretation Date/Time:  Sunday Jul 15 2023 20:28:57 EDT Ventricular Rate:  69 PR Interval:  166 QRS Duration:  104 QT Interval:  384 QTC Calculation: 411 R Axis:   13  Text Interpretation: Normal sinus rhythm Left ventricular hypertrophy ( R in aVL , Sokolow-Lyon , Cornell product ) T wave abnormality, consider inferolateral ischemia No significant change since last tracing When compared with ECG of 04-Jul-2023 22:08, PREVIOUS ECG IS PRESENT Confirmed by Almond Army ([id number]) on 07/15/2023 9:37:13 PM  Radiology DG Chest Port 1 View Result Date: 07/15/2023 CLINICAL DATA:  Fatigue EXAM: PORTABLE CHEST 1 VIEW COMPARISON:  07/06/2023 FINDINGS:  Cardiac shadow is mildly prominent but accentuated by the portable technique. The lungs are clear bilaterally. No bony abnormality is noted. IMPRESSION: No active disease. Electronically Signed   By: Violeta Grey M.D.   On: 07/15/2023 22:09   CT HEAD WO CONTRAST Result Date: 07/15/2023 CLINICAL DATA:  Neuro deficit, acute stroke suspected.  Numbness. EXAM: CT HEAD  WITHOUT CONTRAST TECHNIQUE: Contiguous axial images were obtained from the base of the skull through the vertex without intravenous contrast. RADIATION DOSE REDUCTION: This exam was performed according to the departmental dose-optimization program which includes automated exposure control, adjustment of the mA and/or kV according to patient size and/or use of iterative reconstruction technique. COMPARISON:  MRI head 07/06/2023 and CT head 07/04/2023 FINDINGS: Brain: No intracranial hemorrhage, mass effect, or evidence of acute infarct. No hydrocephalus. No extra-axial fluid collection. Vascular: No hyperdense vessel or unexpected calcification. Skull: No fracture or focal lesion. Sinuses/Orbits: Mucosal thickening in the right-greater-than-left maxillary sinuses with air-fluid level in the left maxillary sinus. Mucosal thickening and frothy debris in the ethmoid air cells, sphenoid sinuses, and frontal sinuses. No mastoid effusion. Globes are intact. Other: None. IMPRESSION: 1. No acute intracranial abnormality. 2. Paranasal sinus disease. Electronically Signed   By: Rozell Cornet M.D.   On: 07/15/2023 21:56    Procedures Procedures    Medications Ordered in ED Medications  sodium chloride flush (NS) 0.9 % injection 3 mL (has no administration in time range)  lactated ringers  bolus 1,000 mL (has no administration in time range)  potassium chloride  SA (KLOR-CON  M) CR tablet 40 mEq (has no administration in time range)  potassium chloride  10 mEq in 100 mL IVPB (has no administration in time range)    ED Course/ Medical Decision Making/  A&P                                 Medical Decision Making Amount and/or Complexity of Data Reviewed Labs: ordered. Radiology: ordered.  Risk Prescription drug management.   Pt with multiple medical problems and comorbidities and presenting today with a complaint that caries a high risk for morbidity and mortality.  Here today with vague symptoms of a cold sensation on the back of his head going down into his bilateral arms and legs.  This would usually last less than 5 minutes and has been intermittent since 1 having approximately 5 episodes.  Patient also just reports feeling fatigued today.  However he has no infectious symptoms.  He has had no falls.  Neuroexam is normal here.  He denies having the symptoms at this time.  Concern for electrolyte abnormality versus dysrhythmia.  Symptoms are not classic for stroke but patient does have recent stroke 2 weeks ago and has known carotid artery disease and prior atrial fibrillation but it has been a while.  He has been compliant with his aspirin  and Plavix . I independently interpreted patient's labs and EKG.  EKG does not show any acute changes today and he is in a sinus rhythm.  He is still wearing the Zio patch.  CBG is 107, Chem-8 shows hypokalemia with potassium of 2.8 and a creatinine that is slightly elevated from baseline at 1.3, CMP was consistent with the same.  EtOH and CBC are within normal limits. I have independently visualized and interpreted pt's images today.  Head CT without acute findings, chest x-ray with poor exposure but no acute findings.  Radiology reports no acute findings in either.  Patient's potassium was replaced. Spoke with Dr. Bonnita Buttner with neurology and he agrees that pt needs a repeat MRI to ensure no further stroke findings.          Final Clinical Impression(s) / ED Diagnoses Final diagnoses:  None    Rx / DC Orders ED Discharge Orders     None  Almond Army, MD 07/15/23 [phone]

## 2023-07-15 NOTE — Progress Notes (Signed)
 "     Cardiology Office Note:    Date:  07/16/2023  ID:  Zachary Cruz, DOB 1980/09/19, MRN [id number] PCP: [name], NP  [facility] Providers Cardiologist:  Dub Huntsman, DO       Patient Profile:      Paroxysmal atrial fibrillation Remote history, low CHA2DS2-VASc in the past>> no anticoagulation ETT 05/17/2017: Intermediate, 1 mm horizontal ST segment depression in inferolateral leads (DTS 5) CCTA 08/28/2017: CAC score 0, no CAD S/p CVA 06/2023 TTE 07/06/23: EF 55, no RWMA, images poor/bubble study probably negative, trivial MR Hyperlipidemia Hypertension OSA on CPAP Obesity        Discussed the use of AI scribe software for clinical note transcription with the patient, who gave verbal consent to proceed.  History of Present Illness Zachary Cruz is a 43 y.o. male who returns for post hospital follow up. He was last seen by Dr. Huntsman 07/2021.  He has also been followed in our hypertension clinic and was last seen in November 2024.  He was recently admitted 4/23-4/25 with an acute stroke.  MRI demonstrated small focus of acute ischemia within the right cerebral peduncle and chronic microvascular white matter changes.  Head neck CTA demonstrated fetal origin of the right PCA and stenosis of the right carotid terminus.  Stroke was felt to be likely due to small vessel disease.  He was seen by neurology.  Neurology recommended aspirin  and Plavix  for 3 weeks, then aspirin  alone.  Given his remote history of paroxysmal atrial fibrillation, it was recommended he have a 30-day event monitor at discharge. EKGs and Tele available from his admission were reviewed and demonstrated NSR. There are no results yet on his monitor.  He is here alone.  His episode of atrial fibrillation occurred over a decade ago due to medication interaction.  He was taking over-the-counter decongestants along with his blood pressure medications.  He has not experienced any symptoms of atrial fibrillation  since then and is not on anticoagulation due to low stroke risk. He monitors his blood pressure at home, which has been ranging from 130s to 140s over 80s to 90s, with a recent reading of 134/84.  He has not had chest discomfort, shortness of breath, or syncope.    ROS-See HPI     Studies Reviewed:         Results LABS Total cholesterol: 167 mg/dL (95/74/7974) HDL: 50 mg/dL (95/74/7974) LDL: 895 mg/dL (95/74/7974) Triglyceride: 64 mg/dL (95/74/7974) Hemoglobin: 12.8 g/dL (95/74/7974) Creatinine: 1.01 mg/dL (95/74/7974) ALT: 39 U/L (07/06/2023) Potassium: 2.8 mmol/L (07/15/2023)     Risk Assessment/Calculations:             Physical Exam:   VS:  BP 134/84   Pulse 68   Ht 6' 1 (1.854 m)   Wt (!) 329 lb (149.2 kg)   SpO2 97%   BMI 43.41 kg/m    Wt Readings from Last 3 Encounters:  07/16/23 (!) 329 lb (149.2 kg)  07/15/23 (!) 331 lb (150.1 kg)  07/10/23 (!) 332 lb 11.2 oz (150.9 kg)    Constitutional:      Appearance: Healthy appearance. Not in distress.  Neck:     Vascular: JVD normal.  Pulmonary:     Breath sounds: No wheezing. No rales.  Cardiovascular:     Normal rate. Regular rhythm.     Murmurs: There is no murmur.  Edema:    Peripheral edema absent.  Abdominal:     Palpations: Abdomen is soft.  Assessment and Plan:   Assessment & Plan History of stroke He was recently admitted with an acute ischemic stroke with a small focus of ischemia in the right anterior midbrain, likely due to small vessel disease rather than atrial fibrillation. No new intracranial abnormalities on recent imaging. Dual antiplatelet therapy was initiated post-stroke, transitioning to aspirin  monotherapy after 3 weeks. Workup in the hospital was most c/w small vessel disease contributing to his stroke. With his hx of atrial fibrillation, he needs an event monitor to rule this out.  - Follow up with neurology on May 27 as planned. - Continue dual antiplatelet therapy  (Clopidogrel  75 mg once daily, ASA 81 mg once daily) for three weeks, then transition to aspirin  monotherapy as directed by Neurology. - Continue Crestor  20 mg once daily  - Complete event monitor and follow up with me or Dr. Sheena in 8 weeks Paroxysmal atrial fibrillation (HCC) Remote episode of atrial fibrillation > 10 years ago. This was likely medication-induced. Since he had a lone episode with a possible trigger, will obtain event monitor first to determine if he needs anticoagulation.  - Continue cardiac monitoring for 30 days. - Consider referral to EP for a loop recorder if monitoring is inconclusive. Essential hypertension Borderline controlled with recent readings ranging from 130s/80s to 140s/90s. He should continue permissive BP given recent stroke. Ultimately, goal is < 130/80.  - Continue chlorthalidone  25 mg daily, Nebivolol  20 mg daily, olmesartan  40 mg daily - Monitor blood pressure regularly. - Advise on low-salt diet and lifestyle modifications. - He is allergic to amlodipine  - Consider spironolactone or hydralazine if blood pressure remains above target. Hypokalemia Recent hypokalemia with potassium level of 2.8, likely due to chlorthalidone  use. Potassium supplementation initiated.   - Order basic metabolic panel (BMP) later this week to recheck potassium        Dispo:  Return in about 8 weeks (around 09/10/2023) for Follow up after testing w/ Dr. Sheena, or Glendia Ferrier, PA-C.  Signed, Glendia Ferrier, PA-C   "

## 2023-07-15 NOTE — ED Provider Notes (Signed)
 Care assumed at 2330.  Patient with history of recent CVA here for an of cold spell to the back of his neck, back of his arms and thighs with 5 separate episodes.  No episodes since ED arrival.  Care assumed pending MRI.  MRI without acute changes, demonstrates evolving infarct.  He does have hypokalemia, this has been replaced.  Will send potassium to the pharmacy.  Discussed with patient unclear source of symptoms.  Discussed outpatient follow-up for further evaluation.  Return precautions if new or concerning symptoms.    Kelsey Patricia, MD 07/16/23 [phone]

## 2023-07-16 ENCOUNTER — Emergency Department (HOSPITAL_COMMUNITY)

## 2023-07-16 ENCOUNTER — Ambulatory Visit: Attending: Physician Assistant | Admitting: Physician Assistant

## 2023-07-16 ENCOUNTER — Other Ambulatory Visit: Payer: Self-pay | Admitting: *Deleted

## 2023-07-16 ENCOUNTER — Encounter: Payer: Self-pay | Admitting: Physician Assistant

## 2023-07-16 VITALS — BP 134/84 | HR 68 | Ht 73.0 in | Wt 329.0 lb

## 2023-07-16 DIAGNOSIS — I10 Essential (primary) hypertension: Secondary | ICD-10-CM | POA: Diagnosis not present

## 2023-07-16 DIAGNOSIS — I480 Paroxysmal atrial fibrillation: Secondary | ICD-10-CM

## 2023-07-16 DIAGNOSIS — Z8673 Personal history of transient ischemic attack (TIA), and cerebral infarction without residual deficits: Secondary | ICD-10-CM | POA: Diagnosis not present

## 2023-07-16 DIAGNOSIS — E876 Hypokalemia: Secondary | ICD-10-CM

## 2023-07-16 MED ORDER — POTASSIUM CHLORIDE CRYS ER 20 MEQ PO TBCR: 20.0000 meq | EXTENDED_RELEASE_TABLET | Freq: Two times a day (BID) | ORAL | 0 refills | Status: DC

## 2023-07-16 NOTE — ED Notes (Signed)
 Patient Alert and oriented to baseline. Stable and ambulatory to baseline. Patient verbalized understanding of the discharge instructions.  Patient belongings were taken by the patient.

## 2023-07-16 NOTE — Patient Instructions (Signed)
 Medication Instructions:  Your physician recommends that you continue on your current medications as directed. Please refer to the Current Medication list given to you today.  *If you need a refill on your cardiac medications before your next appointment, please call your pharmacy*  Lab Work: COME BACK THURSDAY OR FRIDAY FOR:  BMET  If you have labs (blood work) drawn today and your tests are completely normal, you will receive your results only by: MyChart Message (if you have MyChart) OR A paper copy in the mail If you have any lab test that is abnormal or we need to change your treatment, we will call you to review the results.  Testing/Procedures: None ordered  Follow-Up: At [facility], you and your health needs are our priority.  As part of our continuing mission to provide you with exceptional heart care, our providers are all part of one team.  This team includes your primary Cardiologist (physician) and Advanced Practice Providers or APPs (Physician Assistants and Nurse Practitioners) who all work together to provide you with the care you need, when you need it.  Your next appointment:   8 week(s)  Provider:   Kardie Tobb, DO or Marlyse Single, PA-C          We recommend signing up for the patient portal called "MyChart".  Sign up information is provided on this After Visit Summary.  MyChart is used to connect with patients for Virtual Visits (Telemedicine).  Patients are able to view lab/test results, encounter notes, upcoming appointments, etc.  Non-urgent messages can be sent to your provider as well.   To learn more about what you can do with MyChart, go to [email or web address].   Other Instructions Monitor your blood pressure.  Let us  know if it is running consistently over 140/90

## 2023-07-16 NOTE — Assessment & Plan Note (Signed)
 Remote episode of atrial fibrillation > 10 years ago. This was likely medication-induced. Since he had a lone episode with a possible trigger, will obtain event monitor first to determine if he needs anticoagulation.  - Continue cardiac monitoring for 30 days. - Consider referral to EP for a loop recorder if monitoring is inconclusive.

## 2023-07-16 NOTE — Assessment & Plan Note (Signed)
 Borderline controlled with recent readings ranging from 130s/80s to 140s/90s. He should continue permissive BP given recent stroke. Ultimately, goal is < 130/80.  - Continue chlorthalidone  25 mg daily, Nebivolol  20 mg daily, olmesartan  40 mg daily - Monitor blood pressure regularly. - Advise on low-salt diet and lifestyle modifications. - He is allergic to amlodipine  - Consider spironolactone or hydralazine if blood pressure remains above target.

## 2023-07-16 NOTE — Assessment & Plan Note (Signed)
 Recent hypokalemia with potassium level of 2.8, likely due to chlorthalidone  use. Potassium supplementation initiated.   - Order basic metabolic panel (BMP) later this week to recheck potassium

## 2023-07-21 LAB — BASIC METABOLIC PANEL WITH GFR
BUN/Creatinine Ratio: 9 (ref 9–20)
BUN: 12 mg/dL (ref 6–24)
CO2: 24 mmol/L (ref 20–29)
Calcium: 9.4 mg/dL (ref 8.7–10.2)
Chloride: 102 mmol/L (ref 96–106)
Creatinine, Ser: 1.32 mg/dL — ABNORMAL HIGH (ref 0.76–1.27)
Glucose: 103 mg/dL — ABNORMAL HIGH (ref 70–99)
Potassium: 3.6 mmol/L (ref 3.5–5.2)
Sodium: 143 mmol/L (ref 134–144)
eGFR: 69 mL/min/{1.73_m2} (ref >59)

## 2023-07-24 ENCOUNTER — Other Ambulatory Visit (HOSPITAL_BASED_OUTPATIENT_CLINIC_OR_DEPARTMENT_OTHER): Payer: Self-pay | Admitting: *Deleted

## 2023-07-24 ENCOUNTER — Other Ambulatory Visit: Payer: Self-pay | Admitting: Cardiology

## 2023-07-24 ENCOUNTER — Encounter: Payer: Self-pay | Admitting: Family Medicine

## 2023-07-24 MED ORDER — POTASSIUM CHLORIDE CRYS ER 20 MEQ PO TBCR: 20.0000 meq | EXTENDED_RELEASE_TABLET | Freq: Two times a day (BID) | ORAL | 3 refills | Status: DC

## 2023-07-24 NOTE — Telephone Encounter (Signed)
 Call placed to pt.  He has been made aware that he will need to reach out to his neurologist office regarding the Plavix  refill, but we would be glad to fill the Potassium.  Potassium sent to [facility] per pt's request.

## 2023-07-26 ENCOUNTER — Encounter: Payer: Self-pay | Admitting: Cardiology

## 2023-07-27 NOTE — Telephone Encounter (Signed)
 Addressed by Marlyse Single, PA-C in other MyChart message. Please see other MyChart message from 07/26/23 for details.

## 2023-07-29 ENCOUNTER — Other Ambulatory Visit: Payer: Self-pay | Admitting: Cardiovascular Disease

## 2023-07-29 DIAGNOSIS — I10 Essential (primary) hypertension: Secondary | ICD-10-CM

## 2023-07-30 ENCOUNTER — Encounter: Payer: Self-pay | Admitting: Medical-Surgical

## 2023-08-01 ENCOUNTER — Other Ambulatory Visit: Payer: Self-pay

## 2023-08-01 ENCOUNTER — Emergency Department (HOSPITAL_COMMUNITY)

## 2023-08-01 ENCOUNTER — Emergency Department (HOSPITAL_COMMUNITY)
Admission: EM | Admit: 2023-08-01 | Discharge: 2023-08-01 | Disposition: A | Discharge status: Home / Self Care | Attending: Emergency Medicine | Admitting: Emergency Medicine

## 2023-08-01 DIAGNOSIS — R202 Paresthesia of skin: Secondary | ICD-10-CM

## 2023-08-01 DIAGNOSIS — I10 Essential (primary) hypertension: Secondary | ICD-10-CM | POA: Insufficient documentation

## 2023-08-01 DIAGNOSIS — Z79899 Other long term (current) drug therapy: Secondary | ICD-10-CM | POA: Diagnosis not present

## 2023-08-01 DIAGNOSIS — G459 Transient cerebral ischemic attack, unspecified: Secondary | ICD-10-CM | POA: Insufficient documentation

## 2023-08-01 DIAGNOSIS — E876 Hypokalemia: Secondary | ICD-10-CM | POA: Insufficient documentation

## 2023-08-01 DIAGNOSIS — K0889 Other specified disorders of teeth and supporting structures: Secondary | ICD-10-CM | POA: Diagnosis not present

## 2023-08-01 DIAGNOSIS — R200 Anesthesia of skin: Secondary | ICD-10-CM

## 2023-08-01 DIAGNOSIS — Z7982 Long term (current) use of aspirin: Secondary | ICD-10-CM | POA: Insufficient documentation

## 2023-08-01 DIAGNOSIS — I679 Cerebrovascular disease, unspecified: Secondary | ICD-10-CM

## 2023-08-01 LAB — DIFFERENTIAL
Abs Immature Granulocytes: 0.01 10*3/uL (ref 0.00–0.07)
Basophils Absolute: 0 10*3/uL (ref 0.0–0.1)
Basophils Relative: 1 %
Eosinophils Absolute: 0.3 10*3/uL (ref 0.0–0.5)
Eosinophils Relative: 5 %
Immature Granulocytes: 0 %
Lymphocytes Relative: 34 %
Lymphs Abs: 1.9 10*3/uL (ref 0.7–4.0)
Monocytes Absolute: 0.5 10*3/uL (ref 0.1–1.0)
Monocytes Relative: 9 %
Neutro Abs: 2.9 10*3/uL (ref 1.7–7.7)
Neutrophils Relative %: 51 %

## 2023-08-01 LAB — COMPREHENSIVE METABOLIC PANEL WITH GFR
ALT: 55 U/L — ABNORMAL HIGH (ref 0–44)
AST: 34 U/L (ref 15–41)
Albumin: 3.7 g/dL (ref 3.5–5.0)
Alkaline Phosphatase: 55 U/L (ref 38–126)
Anion gap: 10 (ref 5–15)
BUN: 8 mg/dL (ref 6–20)
CO2: 27 mmol/L (ref 22–32)
Calcium: 9.2 mg/dL (ref 8.9–10.3)
Chloride: 104 mmol/L (ref 98–111)
Creatinine, Ser: 1.15 mg/dL (ref 0.61–1.24)
GFR, Estimated: >60 mL/min (ref >60)
Glucose, Bld: 95 mg/dL (ref 70–99)
Potassium: 3 mmol/L — ABNORMAL LOW (ref 3.5–5.1)
Sodium: 141 mmol/L (ref 135–145)
Total Bilirubin: 0.3 mg/dL (ref 0.0–1.2)
Total Protein: 7.5 g/dL (ref 6.5–8.1)

## 2023-08-01 LAB — CBC
HCT: 38.4 % — ABNORMAL LOW (ref 39.0–52.0)
Hemoglobin: 12.9 g/dL — ABNORMAL LOW (ref 13.0–17.0)
MCH: 26.9 pg (ref 26.0–34.0)
MCHC: 33.6 g/dL (ref 30.0–36.0)
MCV: 80 fL (ref 80.0–100.0)
Platelets: 289 10*3/uL (ref 150–400)
RBC: 4.8 MIL/uL (ref 4.22–5.81)
RDW: 12.6 % (ref 11.5–15.5)
WBC: 5.6 10*3/uL (ref 4.0–10.5)
nRBC: 0 % (ref 0.0–0.2)

## 2023-08-01 LAB — I-STAT CHEM 8, ED
BUN: 9 mg/dL (ref 6–20)
Calcium, Ion: 1.21 mmol/L (ref 1.15–1.40)
Chloride: 102 mmol/L (ref 98–111)
Creatinine, Ser: 1.2 mg/dL (ref 0.61–1.24)
Glucose, Bld: 92 mg/dL (ref 70–99)
HCT: 40 % (ref 39.0–52.0)
Hemoglobin: 13.6 g/dL (ref 13.0–17.0)
Potassium: 2.9 mmol/L — ABNORMAL LOW (ref 3.5–5.1)
Sodium: 143 mmol/L (ref 135–145)
TCO2: 26 mmol/L (ref 22–32)

## 2023-08-01 LAB — RAPID URINE DRUG SCREEN, HOSP PERFORMED
Amphetamines: NOT DETECTED
Barbiturates: NOT DETECTED
Benzodiazepines: NOT DETECTED
Cocaine: NOT DETECTED
Opiates: NOT DETECTED
Tetrahydrocannabinol: NOT DETECTED

## 2023-08-01 LAB — MAGNESIUM: Magnesium: 2 mg/dL (ref 1.7–2.4)

## 2023-08-01 LAB — ETHANOL: Alcohol, Ethyl (B): <15 mg/dL (ref <15)

## 2023-08-01 LAB — PROTIME-INR
INR: 1.1 (ref 0.8–1.2)
Prothrombin Time: 14.7 s (ref 11.4–15.2)

## 2023-08-01 LAB — APTT: aPTT: 29 s (ref 24–36)

## 2023-08-01 MED ORDER — CLOPIDOGREL BISULFATE 75 MG PO TABS: 75.0000 mg | ORAL_TABLET | Freq: Every day | ORAL | 1 refills | Status: DC

## 2023-08-01 MED ORDER — POTASSIUM CHLORIDE CRYS ER 20 MEQ PO TBCR
40.0000 meq | EXTENDED_RELEASE_TABLET | Freq: Once | ORAL | Status: AC
  Administered 2023-08-01: 40 meq via ORAL
  Filled 2023-08-01: qty 2

## 2023-08-01 NOTE — ED Provider Notes (Signed)
 See previous note for full details.  Signout received on this 43 year old male pending MRI. Physical Exam  BP (!) 155/89   Pulse (!) 55   Temp 98.3 F (36.8 C)   Resp 16   SpO2 100%     Procedures  Procedures  ED Course / MDM    Medical Decision Making Amount and/or Complexity of Data Reviewed Labs: ordered. Radiology: ordered.  Risk Prescription drug management.   MRI without acute concern.  Neurology recommended if this was normal he is appropriate for follow-up outpatient and start dual antiplatelet therapy for next 3 weeks and then only continue Plavix . Plavix  prescribed.  He is in agreement and understands plan.  Discharged in stable condition.       Lucina Sabal, PA-C 08/01/23 1904    Mozell Arias, MD 08/01/23 2312

## 2023-08-01 NOTE — Plan of Care (Signed)
 Discussed with PA K Sylvester Evert, in brief this is a patient presenting with left-sided face arm and leg tingling that was brief, known significant intracranial atherosclerotic disease particularly right PCA and right carotid.  He also had dental pain but this is felt to be a separate unrelated problem per ED PA  Recently had full TIA/stroke workup, however event monitor does not appear to have been completed yet; remote history of atrial fibrillation felt to be self-limited and therefore he is not on anticoagulation  I do recommend MRI brain without contrast to be repeated to assess for any evidence of new embolic strokes  Recommend event monitor be completed as recommended previously  If otherwise back to baseline and MRI brain is negative resume DAPT for 3 weeks (75 mg Plavix , 81 mg aspirin  daily), followed by 75 mg Plavix  daily thereafter, and follow-up with his outpatient providers  12 minutes spent in care, majority and discussion with PA Sylvester Evert, who will notify patient of interprofessional consultation

## 2023-08-01 NOTE — Discharge Instructions (Signed)
 CT scan and MRI did not show any concerning findings.  Rest of the blood work was reassuring.  I have sent Plavix  into the pharmacy for you.  Take Plavix  along with aspirin  for 3 weeks and then discontinue the aspirin  and only take Plavix  after that.  Follow-up with your neurologist.  There was an order put in for a cardiac event monitor. Return for any emergent symptoms.

## 2023-08-01 NOTE — ED Notes (Signed)
 Patient transported to MRI

## 2023-08-01 NOTE — ED Provider Notes (Signed)
 [place] EMERGENCY DEPARTMENT AT [facility] Provider Note   CSN: [id number] Arrival date & time: 08/01/23  4098     History  Chief Complaint  Patient presents with   Dental Pain /Hypertension     Zachary Cruz is a 43 y.o. male past medical history significant for TIA and A-fib presents today for left-sided dental pain/numbness, hypertension, and fatigue that began early this morning.  Patient denies any symptoms at this time.  Patient reports he had a TIA at the end of April and is currently taking aspirin .  Patient denies fever, chills, slurred speech, confusion, ataxia, aphasia, vision changes, chest pain, shortness of breath, abdominal pain, nausea, vomiting, any other complaints at this time.  HPI     Home Medications Prior to Admission medications   Medication Sig Start Date End Date Taking? Authorizing Provider  aspirin  EC 81 MG tablet Take 1 tablet (81 mg total) by mouth daily. Swallow whole. 07/06/23 07/05/24  Singh, Prashant K, MD  chlorthalidone  (HYGROTON ) 25 MG tablet TAKE 1 TABLET(25 MG) BY MOUTH DAILY 07/30/23   Marlyse Single T, PA-C  mesalamine (LIALDA) 1.2 g EC tablet Take 2.4 g by mouth daily with breakfast.    [provider]  Nebivolol  HCl 20 MG TABS Take 1 tablet (20 mg total) by mouth daily. 07/07/23   Cala Castleman, MD  olmesartan  (BENICAR ) 40 MG tablet Take 1 tablet (40 mg total) by mouth daily. 07/08/23   Singh, Prashant K, MD  potassium chloride  SA (KLOR-CON  M) 20 MEQ tablet Take 1 tablet (20 mEq total) by mouth 2 (two) times daily. 07/24/23   Marlyse Single T, PA-C  rosuvastatin  (CRESTOR ) 20 MG tablet Take 1 tablet (20 mg total) by mouth daily. 07/06/23   Singh, Prashant K, MD      Allergies    Amlodipine     Review of Systems   Review of Systems  Constitutional:  Positive for fatigue.  Neurological:  Positive for numbness.    Physical Exam Updated Vital Signs BP (!) 145/75   Pulse (!) 54   Temp 97.9 F (36.6 C) (Oral)    Resp 17   SpO2 100%  Physical Exam Vitals and nursing note reviewed.  Constitutional:      General: He is not in acute distress.    Appearance: Normal appearance. He is well-developed. He is obese. He is not ill-appearing, toxic-appearing or diaphoretic.  HENT:     Head: Normocephalic and atraumatic.     Right Ear: External ear normal.     Left Ear: External ear normal.     Nose: Nose normal.     Mouth/Throat:     Mouth: Mucous membranes are moist.  Eyes:     Conjunctiva/sclera: Conjunctivae normal.  Cardiovascular:     Rate and Rhythm: Normal rate and regular rhythm.     Pulses: Normal pulses.     Heart sounds: Normal heart sounds. No murmur heard. Pulmonary:     Effort: Pulmonary effort is normal. No respiratory distress.     Breath sounds: Normal breath sounds.  Abdominal:     Palpations: Abdomen is soft.     Tenderness: There is no abdominal tenderness.  Musculoskeletal:        General: No swelling.     Cervical back: Neck supple.  Skin:    General: Skin is warm and dry.     Capillary Refill: Capillary refill takes less than 2 seconds.  Neurological:     General: No focal deficit  present.     Mental Status: He is alert and oriented to person, place, and time.     GCS: GCS eye subscore is 4. GCS verbal subscore is 5. GCS motor subscore is 6.     Cranial Nerves: Cranial nerves 2-12 are intact. No cranial nerve deficit.     Sensory: Sensation is intact.     Motor: No weakness or pronator drift.     Coordination: Coordination is intact. Coordination normal.     Gait: Gait normal.  Psychiatric:        Mood and Affect: Mood normal.     ED Results / Procedures / Treatments   Labs (all labs ordered are listed, but only abnormal results are displayed) Labs Reviewed  CBC - Abnormal; Notable for the following components:      Result Value   Hemoglobin 12.9 (*)    HCT 38.4 (*)    All other components within normal limits  COMPREHENSIVE METABOLIC PANEL WITH GFR -  Abnormal; Notable for the following components:   Potassium 3.0 (*)    ALT 55 (*)    All other components within normal limits  I-STAT CHEM 8, ED - Abnormal; Notable for the following components:   Potassium 2.9 (*)    All other components within normal limits  ETHANOL  PROTIME-INR  APTT  DIFFERENTIAL  RAPID URINE DRUG SCREEN, HOSP PERFORMED  MAGNESIUM    EKG None  Radiology CT HEAD WO CONTRAST Result Date: 08/01/2023 CLINICAL DATA:  TIA, stroke suspected EXAM: CT HEAD WITHOUT CONTRAST TECHNIQUE: Contiguous axial images were obtained from the base of the skull through the vertex without intravenous contrast. RADIATION DOSE REDUCTION: This exam was performed according to the departmental dose-optimization program which includes automated exposure control, adjustment of the mA and/or kV according to patient size and/or use of iterative reconstruction technique. COMPARISON:  MRI brain 07/16/2023, CT brain 07/15/2023 FINDINGS: Brain: No acute territorial infarction, hemorrhage or intracranial mass. Stable ventricular size Vascular: No hyperdense vessel or unexpected calcification. Skull: Normal. Negative for fracture or focal lesion. Sinuses/Orbits: Moderate mucosal thickening in the sinuses. Other: None IMPRESSION: 1. No CT evidence for acute intracranial abnormality. 2. Moderate mucosal thickening in the sinuses. Electronically Signed   By: Esmeralda Hedge M.D.   On: 08/01/2023 15:16    Procedures Procedures    Medications Ordered in ED Medications  potassium chloride  SA (KLOR-CON  M) CR tablet 40 mEq (40 mEq Oral Given 08/01/23 1332)    ED Course/ Medical Decision Making/ A&P                                 Medical Decision Making Amount and/or Complexity of Data Reviewed Labs: ordered. Radiology: ordered.  Risk Prescription drug management.   This patient presents to the ED for concern of numbness differential diagnosis includes TIA, stroke, anxiety    Additional history  obtained:  External records from outside source obtained and reviewed including previous admission documents   Lab Tests:  I Ordered, and personally interpreted labs.  The pertinent results include: Hypokalemia 3.0, mild anemia at 12.9, mildly elevated ALT   Imaging Studies ordered:  I ordered imaging studies including CT head Noncon I independently visualized and interpreted imaging which showed pending I agree with the radiologist interpretation EKG which showed sinus bradycardia with sinus arrhythmia   Medicines ordered and prescription drug management:  I ordered medication including potassium for mild hypokalemia Reevaluation of the  patient after these medicines showed that the patient stayed the same I have reviewed the patients home medicines and have made adjustments as needed   Consult neurology, Dr. Cleone Dad who recommended repeat head MRI without and restarting dual therapy with 75 mg Plavix  and 81 mg aspirin  daily for 3 weeks, then monotherapy with 75 mg Plavix  afterwards.  Further urgent outpatient referral for cardiac event monitoring.  Patient signed out to Zachary Sabal, PA-C pending imaging which will determine disposition..  Please refer to their note for full ED course.        Final Clinical Impression(s) / ED Diagnoses Final diagnoses:  None    Rx / DC Orders ED Discharge Orders          Ordered    Cardiac event monitor        08/01/23 1532              Merryl Abraham 08/01/23 1532    Almond Army, MD 08/02/23 [phone]

## 2023-08-01 NOTE — ED Notes (Signed)
 Pt provided with labeled specimen cup and made aware of need for UA.

## 2023-08-01 NOTE — ED Notes (Signed)
 Pt began to experience dull, left sided "dental pain two days ago." Around "0300 or 0400" the dull pain transitioned to "shooting" and was accompanied by left sided numbness. S/S resolved PTA. Pt experienced a stroke "about a month ago" and wanted to be seen for S/S.

## 2023-08-01 NOTE — ED Triage Notes (Signed)
 Patient reports left upper dental pain this morning , hypertension BP=167/108 at home , fatigue and insomnia .

## 2023-08-06 NOTE — Progress Notes (Unsigned)
 [facility] [address] [place]. [place] [address] [phone]       HOSPITAL FOLLOW UP NOTE  Mr. Zachary Cruz Date of Birth: 25-Feb-1981 Medical Record Number: [id number]   Reason for Referral:  hospital stroke follow up    SUBJECTIVE:   CHIEF COMPLAINT:  No chief complaint on file.   HPI:   Zachary Cruz is a 43 y.o. who  has a past medical history of Atrial fibrillation (HCC), Childhood asthma, Hypertension, Sleep apnea, and Stroke (HCC).  Patient presented on 07/04/2023 with right sided facial spasm, dysarthria, dysphagia and fatigue. DAPT x 3 weeks then aspirin  alone. Crestor  started.  Personally reviewed hospitalization pertinent progress notes, lab work and imaging.  Evaluated by Dr Janett Medin.   Since discharge,   Cardiology eval? 30 day heart monitor showed?. Eliquis ?  OSA on CPAP   PERTINENT IMAGING/LABS  CT head No acute abnormality.  CTA head & neck fetal origin of the right PCA, stenosis of right carotid terminus MRI small focus of acute ischemia within right cerebral peduncle, chronic microvascular white matter changes 2D Echo 55%, bubble study probably negative but images poor, left atrium normal in size 30-day cardiac monitoring upon discharge   A1C Lab Results  Component Value Date   HGBA1C 6.2 (H) 07/06/2023    Lipid Panel     Component Value Date/Time   CHOL 167 07/06/2023 0430   CHOL 163 05/17/2017 0819   TRIG 64 07/06/2023 0430   HDL 50 07/06/2023 0430   HDL 59 05/17/2017 0819   CHOLHDL 3.3 07/06/2023 0430   VLDL 13 07/06/2023 0430   LDLCALC 104 (H) 07/06/2023 0430   LDLCALC 94 05/08/2022 1108   LABVLDL 12 05/17/2017 0819      ROS:   14 system review of systems performed and negative with exception of those listed in HPI  PMH:  Past Medical History:  Diagnosis Date   Atrial fibrillation (HCC)    Childhood asthma    Hypertension    Sleep apnea    Stroke (HCC)     PSH:  Past Surgical History:   Procedure Laterality Date   CHOLECYSTECTOMY N/A 11/08/2016   Procedure: LAPAROSCOPIC CHOLECYSTECTOMY;  Surgeon: Claudia Cuff, MD;  Location: [facility] ORS;  Service: General;  Laterality: N/A;   TONSILLECTOMY      Social History:  Social History   Socioeconomic History   Marital status: Divorced    Spouse name: Not on file   Number of children: Not on file   Years of education: Not on file   Highest education level: Not on file  Occupational History   Not on file  Tobacco Use   Smoking status: Former    Current packs/day: 0.00    Average packs/day: 0.1 packs/day for 7.0 years (0.7 ttl pk-yrs)    Types: Cigarettes    Start date: 04/1996    Quit date: 04/2003    Years since quitting: 20.3   Smokeless tobacco: Never  Vaping Use   Vaping status: Never Used  Substance and Sexual Activity   Alcohol use: No   Drug use: Not Currently    Comment: Marijuana as a teenager.   Sexual activity: Not Currently  Other Topics Concern   Not on file  Social History Narrative   Not on file   Social Drivers of Health   Financial Resource Strain: Not on file  Food Insecurity: No Food Insecurity (07/05/2023)   Hunger Vital Sign    Worried About Running Out of  Food in the Last Year: Never true    Ran Out of Food in the Last Year: Never true  Transportation Needs: No Transportation Needs (07/05/2023)   PRAPARE - Administrator, Civil Service (Medical): No    Lack of Transportation (Non-Medical): No  Physical Activity: Not on file  Stress: Not on file  Social Connections: Moderately Integrated (07/05/2023)   Social Connection and Isolation Panel [NHANES]    Frequency of Communication with Friends and Family: More than three times a week    Frequency of Social Gatherings with Friends and Family: More than three times a week    Attends Religious Services: More than 4 times per year    Active Member of [employer] or Organizations: Yes    Attends Engineer, structural: More than  4 times per year    Marital Status: Divorced  Intimate Partner Violence: Not At Risk (07/05/2023)   Humiliation, Afraid, Rape, and Kick questionnaire    Fear of Current or Ex-Partner: No    Emotionally Abused: No    Physically Abused: No    Sexually Abused: No    Family History:  Family History  Problem Relation Age of Onset   Diabetes Father    Mental illness Father    Hypertension Maternal Grandfather     Medications:   Current Outpatient Medications on File Prior to Visit  Medication Sig Dispense Refill   aspirin  EC 81 MG tablet Take 1 tablet (81 mg total) by mouth daily. Swallow whole. 30 tablet 11   chlorthalidone  (HYGROTON ) 25 MG tablet TAKE 1 TABLET(25 MG) BY MOUTH DAILY 30 tablet 2   clopidogrel  (PLAVIX ) 75 MG tablet Take 1 tablet (75 mg total) by mouth daily. 90 tablet 1   mesalamine (LIALDA) 1.2 g EC tablet Take 2.4 g by mouth daily with breakfast.     Nebivolol  HCl 20 MG TABS Take 1 tablet (20 mg total) by mouth daily.     olmesartan  (BENICAR ) 40 MG tablet Take 1 tablet (40 mg total) by mouth daily.     potassium chloride  SA (KLOR-CON  M) 20 MEQ tablet Take 1 tablet (20 mEq total) by mouth 2 (two) times daily. 60 tablet 3   rosuvastatin  (CRESTOR ) 20 MG tablet Take 1 tablet (20 mg total) by mouth daily. 30 tablet 0   No current facility-administered medications on file prior to visit.    Allergies:   Allergies  Allergen Reactions   Amlodipine  Other (See Comments)    Gingival hyperplasia      OBJECTIVE:  Physical Exam  There were no vitals filed for this visit. There is no height or weight on file to calculate BMI. No results found.     07/04/2023   12:30 PM  Depression screen PHQ 2/9  Decreased Interest 1  Down, Depressed, Hopeless 1  PHQ - 2 Score 2  Altered sleeping 0  Tired, decreased energy 1  Change in appetite 1  Feeling bad or failure about yourself  2  Trouble concentrating 2  Moving slowly or fidgety/restless 0  Suicidal thoughts 0   PHQ-9 Score 8  Difficult doing work/chores Somewhat difficult     General: well developed, well nourished, seated, in no evident distress Head: head normocephalic and atraumatic.   Neck: supple with no carotid or supraclavicular bruits Cardiovascular: regular rate and rhythm, no murmurs Musculoskeletal: no deformity Skin:  no rash/petichiae Vascular:  Normal pulses all extremities   Neurologic Exam Mental Status: Awake and fully alert.  Fluent speech  and language.  Oriented to place and time. Recent and remote memory intact. Attention span, concentration and fund of knowledge appropriate. Mood and affect appropriate.  Cranial Nerves: Fundoscopic exam reveals sharp disc margins. Pupils equal, briskly reactive to light. Extraocular movements full without nystagmus. Visual fields full to confrontation. Hearing intact. Facial sensation intact. Face, tongue, palate moves normally and symmetrically.  Motor: Normal bulk and tone. Normal strength in all tested extremity muscles Sensory.: intact to touch , pinprick , position and vibratory sensation.  Coordination: Rapid alternating movements normal in all extremities. Finger-to-nose and heel-to-shin performed accurately bilaterally. Gait and Station: Arises from chair without difficulty. Stance is normal. Gait demonstrates normal stride length and balance with ***. Tandem walk and heel toe ***.  Reflexes: 1+ and symmetric.    NIHSS  *** Modified Rankin  ***    ASSESSMENT: Zachary Cruz is a 43 y.o. year old male presenting with right sided facial spasms, dysarthria and dysphagia. Vascular risk factors include HTN, HLD, CAD, Afib, obesity.      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 Etiology: Likely small vessel disease: Residual deficit: none. Continue aspirin  81 mg daily and Crestor  for secondary stroke prevention. Discussed secondary stroke prevention measures and importance of close PCP follow up for  aggressive stroke risk factor management. I have gone over the pathophysiology of stroke, warning signs and symptoms, risk factors and their management in some detail with instructions to go to the closest emergency room for symptoms of concern. HTN: BP goal <130/90.  Stable on chlorthalidone  25mg , Nebivolol  20mg  and olmesartan  40mg  daily. Continue per PCP HLD: LDL goal <70. Recent LDL 104. Continue Crestor  20mg  daily per PCP. DMII: A1c goal<7.0. Recent A1c 6.2. Prediabetes. Continue close follow up with PCP. Low carb diet and regular exercise advised.   Afib:Hx 1 episode. Previously on anticoagulation but discontinued by cardiology. CHADSVASC 3.  CAD: Obesity: BMI 44. Continue close follow up with PCP. Healthy, well balanced diet and regular exercise encouraged.  OSA: Continue CPAP therapy nightly for at least 4 hours.    Follow up in *** or call earlier if needed   CC:  [facility] provider: Dr. Janett Medin PCP: [name], NP    I spent *** minutes of face-to-face and non-face-to-face time with patient.  This included previsit chart review including review of recent hospitalization, lab review, study review, order entry, electronic health record documentation, patient education regarding recent stroke including etiology, secondary stroke prevention measures and importance of managing stroke risk factors, residual deficits and typical recovery time and answered all other questions to patient satisfaction   Terrilyn Fick, [facility]  [facility] [address] [place], [place] [address]  Phone [phone] Fax [phone] Note: This document was prepared with digital dictation and possible smart phrase technology. Any transcriptional errors that result from this process are unintentional.

## 2023-08-07 ENCOUNTER — Encounter: Payer: Self-pay | Admitting: Family Medicine

## 2023-08-07 ENCOUNTER — Ambulatory Visit (INDEPENDENT_AMBULATORY_CARE_PROVIDER_SITE_OTHER): Admitting: Family Medicine

## 2023-08-07 VITALS — BP 134/84 | HR 61 | Ht 73.0 in | Wt 326.0 lb

## 2023-08-07 DIAGNOSIS — I639 Cerebral infarction, unspecified: Secondary | ICD-10-CM

## 2023-08-07 MED ORDER — ROSUVASTATIN CALCIUM 20 MG PO TABS: 20.0000 mg | ORAL_TABLET | Freq: Every day | ORAL | 3 refills | Status: DC

## 2023-08-07 NOTE — Addendum Note (Signed)
 Addended by: Gayleen Kawasaki D on: 08/07/2023 07:25 AM   Modules accepted: Orders

## 2023-08-07 NOTE — Patient Instructions (Signed)
 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 Etiology: Likely small vessel disease: Residual deficit: none. Continue Plavix  and Crestor  for secondary stroke prevention. Discussed secondary stroke prevention measures and importance of close PCP follow up for aggressive stroke risk factor management. I have gone over the pathophysiology of stroke, warning signs and symptoms, risk factors and their management in some detail with instructions to go to the closest emergency room for symptoms of concern. HTN: BP goal <130/90.  Stable on chlorthalidone  25mg , Nebivolol  20mg  and olmesartan  40mg  daily. Continue per PCP HLD: LDL goal <70. Recent LDL 104. Continue Crestor  20mg  daily per PCP. DMII: A1c goal<7.0. Recent A1c 6.2. Prediabetes. Continue close follow up with PCP. Low carb diet and regular exercise advised.   Afib:Hx 1 episode. Previously on anticoagulation but discontinued by cardiology. CHADSVASC 3.  CAD: Continue close follow up with cardiology. Continue Crestor  as prescribed by PCP.  Obesity: BMI 44. Continue close follow up with PCP. Healthy, well balanced diet and regular exercise encouraged.  OSA: Follow up with Dr Lauralee Poll at [facility]. Continue CPAP therapy nightly for at least 4 hours.   Goals:  1) Maintain strict control of hypertension with blood pressure goal below 130/90 2) Maintain good control of diabetes with hemoglobin A1c goal below 7%  3) Maintain good control of lipids with LDL cholesterol goal below 70 mg/dL.  4) Eat a healthy diet with plenty of whole grains, cereals, fruits and vegetables, exercise regularly and maintain ideal body weight   Resources: https://www.williams.biz/  Please make sure you are staying well hydrated. I recommend 50-60 ounces daily. Well balanced diet and regular exercise encouraged. Consistent sleep schedule with  6-8 hours recommended.   Please continue follow up with care team as directed.   Follow up with me as needed   You may receive a survey regarding today's visit. I encourage you to leave honest feed back as I do use this information to improve patient care. Thank you for seeing me today!

## 2023-08-09 ENCOUNTER — Encounter: Payer: Self-pay | Admitting: Medical-Surgical

## 2023-08-10 ENCOUNTER — Other Ambulatory Visit (HOSPITAL_COMMUNITY): Payer: Self-pay

## 2023-08-13 ENCOUNTER — Ambulatory Visit: Attending: Cardiology

## 2023-08-13 DIAGNOSIS — G459 Transient cerebral ischemic attack, unspecified: Secondary | ICD-10-CM

## 2023-08-15 ENCOUNTER — Ambulatory Visit: Admitting: Medical-Surgical

## 2023-08-15 NOTE — Progress Notes (Deleted)
   Established patient visit  History, exam, impression, and plan:  No problem-specific Assessment & Plan notes found for this encounter.   ROS  Physical Exam  Procedures performed this visit: None.  No follow-ups on file.  __________________________________ Thayer Ohm, DNP, APRN, FNP-BC Primary Care and Sports Medicine [facility]

## 2023-08-17 ENCOUNTER — Encounter: Payer: Self-pay | Admitting: Medical-Surgical

## 2023-08-17 ENCOUNTER — Ambulatory Visit (INDEPENDENT_AMBULATORY_CARE_PROVIDER_SITE_OTHER): Admitting: Medical-Surgical

## 2023-08-17 VITALS — BP 123/80 | HR 62 | Resp 20 | Ht 73.0 in | Wt 327.0 lb

## 2023-08-17 DIAGNOSIS — G479 Sleep disorder, unspecified: Secondary | ICD-10-CM

## 2023-08-17 DIAGNOSIS — F320 Major depressive disorder, single episode, mild: Secondary | ICD-10-CM

## 2023-08-17 NOTE — Patient Instructions (Signed)
 Trazodone Tablets What is this medication? TRAZODONE (TRAZ oh done) treats depression. It increases the amount of serotonin in the brain, a hormone that helps regulate mood. This medicine may be used for other purposes; ask your health care provider or pharmacist if you have questions. COMMON BRAND NAME(S): Desyrel What should I tell my care team before I take this medication? They need to know if you have any of these conditions: Bipolar disorder Bleeding disorder Glaucoma Heart disease, or previous heart attack Irregular heartbeat or rhythm Kidney disease Liver disease Low levels of sodium in the blood Suicidal thoughts, plans, or attempt by you or a family member An unusual or allergic reaction to trazodone, other medications, foods, dyes, or preservatives Pregnant or trying to get pregnant Breastfeeding How should I use this medication? Take this medication by mouth with a glass of water. Take it as directed on the prescription label at the same time every day. Take this medication shortly after a meal or a light snack. Keep taking this medication unless your care team tells you to stop. Stopping it too quickly can cause serious side effects. It can also make your condition worse. A special MedGuide will be given to you by the pharmacist with each prescription and refill. Be sure to read this information carefully each time. Talk to your care team about the use of this medication in children. Special care may be needed. Overdosage: If you think you have taken too much of this medicine contact a poison control center or emergency room at once. NOTE: This medicine is only for you. Do not share this medicine with others. What if I miss a dose? If you miss a dose, take it as soon as you can. If it is almost time for your next dose, take only that dose. Do not take double or extra doses. What may interact with this medication? Do not take this medication with any of the following: Certain  medications for fungal infections, such as fluconazole, itraconazole, ketoconazole, posaconazole, voriconazole Cisapride Dronedarone Linezolid MAOIs, such as Carbex, Eldepryl, Marplan, Nardil, and Parnate Mesoridazine Methylene blue (injected into a vein) Pimozide Saquinavir Thioridazine This medication may also interact with the following: Alcohol Antiviral medications for HIV or AIDS Aspirin and aspirin-like medications Barbiturates, such as phenobarbital Certain medications for blood pressure, heart disease, irregular heart beat Certain medications for mental health conditions Certain medications for migraine headache, such as almotriptan, eletriptan, frovatriptan, naratriptan, rizatriptan, sumatriptan, zolmitriptan Certain medications for seizures, such as carbamazepine and phenytoin Certain medications for sleep Certain medications that treat or prevent blood clots, such as dalteparin, enoxaparin, warfarin Digoxin Fentanyl Lithium NSAIDS, medications for pain and inflammation, such as ibuprofen or naproxen Other medications that cause heart rhythm changes Rasagiline Supplements, such as St. John's wort, kava kava, valerian Tramadol Tryptophan This list may not describe all possible interactions. Give your health care provider a list of all the medicines, herbs, non-prescription drugs, or dietary supplements you use. Also tell them if you smoke, drink alcohol, or use illegal drugs. Some items may interact with your medicine. What should I watch for while using this medication? Visit your care team for regular checks on your progress. Tell your care team if your symptoms do not start to get better or if they get worse. Because it may take several weeks to see the full effects of this medication, it is important to continue your treatment as prescribed by your care team. Watch for new or worsening thoughts of suicide or depression. This  includes sudden changes in mood, behaviors,  or thoughts. These changes can happen at any time but are more common in the beginning of treatment or after a change in dose. Call your care team right away if you experience these thoughts or worsening depression. This medication may cause mood and behavior changes, such as anxiety, nervousness, irritability, hostility, restlessness, excitability, hyperactivity, or trouble sleeping. These changes can happen at any time but are more common in the beginning of treatment or after a change in dose. Call your care team right away if you notice any of these symptoms. This medication may affect your coordination, reaction time, or judgment. Do not drive or operate machinery until you know how this medication affects you. Sit up or stand slowly to reduce the risk of dizzy or fainting spells. Drinking alcohol with this medication can increase the risk of these side effects. This medication may cause dry eyes and blurred vision. If you wear contact lenses you may feel some discomfort. Lubricating drops may help. See your care team if the problem does not go away or is severe. Your mouth may get dry. Chewing sugarless gum or sucking hard candy and drinking plenty of water may help. Contact your care team if the problem does not go away or is severe. What side effects may I notice from receiving this medication? Side effects that you should report to your care team as soon as possible: Allergic reactions--skin rash, itching, hives, swelling of the face, lips, tongue, or throat Bleeding--bloody or black, tar-like stools, red or dark brown urine, vomiting blood or brown material that looks like coffee grounds, small, red or purple spots on skin, unusual bleeding or bruising Heart rhythm changes--fast or irregular heartbeat, dizziness, feeling faint or lightheaded, chest pain, trouble breathing Low blood pressure--dizziness, feeling faint or lightheaded, blurry vision Low sodium level--muscle weakness, fatigue,  dizziness, headache, confusion Prolonged or painful erection Serotonin syndrome--irritability, confusion, fast or irregular heartbeat, muscle stiffness, twitching muscles, sweating, high fever, seizures, chills, vomiting, diarrhea Sudden eye pain or change in vision such as blurry vision, seeing halos around lights, vision loss Thoughts of suicide or self-harm, worsening mood, feelings of depression Side effects that usually do not require medical attention (report to your care team if they continue or are bothersome): Change in sex drive or performance Constipation Dizziness Drowsiness Dry mouth This list may not describe all possible side effects. Call your doctor for medical advice about side effects. You may report side effects to FDA at 1-800-FDA-1088. Where should I keep my medication? Keep out of the reach of children and pets. Store at room temperature between 15 and 30 degrees C (59 to 86 degrees F). Protect from light. Keep container tightly closed. Throw away any unused medication after the expiration date. NOTE: This sheet is a summary. It may not cover all possible information. If you have questions about this medicine, talk to your doctor, pharmacist, or health care provider.  2024 Elsevier/Gold Standard (2022-02-23 00:00:00)

## 2023-08-17 NOTE — Progress Notes (Signed)
        Established patient visit  History, exam, impression, and plan:  1. Depression, major, single episode, mild (HCC) (Primary) 2. Trouble in sleeping Pleasant 43 year old male presenting today for follow-up on mood management.  He was started on Wellbutrin  however this was discontinued shortly after due to his stroke.  He is not currently medicated for mood management and reports that he is doing okay.  Has started exercising some which helps tremendously with his mental health.  Notes that his biggest problem today is he has significant difficulty falling asleep.  Once he is asleep he is able to stay there.  Often lays in bed until 4 AM before he can finally fall asleep.  Has been practicing good sleep hygiene and denies racing thoughts.  Most of the time he is very restless just trying to fall asleep.  Has not taken any medications to help with his trouble sleeping.  We discussed over-the-counter options as well as herbal options.  Would recommend considering trazodone as this may help with sleep as well as mood.  He would like to look into this further so I have put some drug information on his AVS.  If he wants to start, he will send me a message and we will plan to start at 25-50 mg nightly as needed.  Procedures performed this visit: None.  Return if symptoms worsen or fail to improve.  __________________________________ Maryl Snook, DNP, APRN, FNP-BC Primary Care and Sports Medicine [facility] [place]

## 2023-08-18 ENCOUNTER — Encounter: Payer: Self-pay | Admitting: Medical-Surgical

## 2023-08-21 ENCOUNTER — Encounter: Payer: Self-pay | Admitting: Gastroenterology

## 2023-08-21 DIAGNOSIS — G459 Transient cerebral ischemic attack, unspecified: Secondary | ICD-10-CM | POA: Diagnosis not present

## 2023-08-25 ENCOUNTER — Ambulatory Visit: Payer: Self-pay | Admitting: Cardiology

## 2023-09-09 NOTE — Progress Notes (Signed)
 OFFICE NOTE:    Date:  09/10/2023  ID:  Zachary Cruz, DOB 1980/06/13, MRN [id number] PCP: [name], NP  [facility] Providers Cardiologist:  Dub Huntsman, DO       Patient Profile:  Paroxysmal atrial fibrillation Remote history, low CHA2DS2-VASc in the past>> no anticoagulation ETT 05/17/2017: Intermediate, 1 mm horizontal ST segment depression in inferolateral leads (DTS 5) CCTA 08/28/2017: CAC score 0, no CAD S/p CVA 06/2023 TTE 07/06/23: EF 55, no RWMA, images poor/bubble study probably negative, trivial MR Monitor 07/2023: no AFib Hyperlipidemia Hypertension OSA on CPAP Obesity       Discussed the use of AI scribe software for clinical note transcription with the patient, who gave verbal consent to proceed. History of Present Illness Zachary Cruz is a 43 y.o. male who returns for follow up of AFib. He was last seen in 07/2023 after an admission with a CVA. It was felt that his CVA was due to small vessel disease. Monitor was recommended to rule out AF give prior hx of lone AFib. His monitor was neg for AF or arrhythmias. Of note, neurology plan was to Rx with DAPT for 3 weeks, then ASA alone. However, it appears he was continued on Clopidogrel  Rx alone which will be managed by primary care.   His blood pressure has been well-controlled recently, and he monitors it at home. He has been managing his diet better, particularly for his irritable bowel syndrome (IBS), which has resulted in improved gastrointestinal symptoms and stable blood pressure. He notes an increase in sweating, which is a new symptom for him. He has never been a 'sweater' before, but now experiences increased sweating during physical activity and in hot weather. Thyroid  function tests were normal. No chest pain or shortness of breath.  ROS-See HPI                 Physical Exam:  VS:  BP 116/70   Pulse 64   Ht 6' 1 (1.854 m)   Wt (!) 318 lb 3.2 oz (144.3 kg)   SpO2 97%   BMI 41.98  kg/m        Wt Readings from Last 3 Encounters:  09/10/23 (!) 318 lb 3.2 oz (144.3 kg)  08/17/23 (!) 327 lb (148.3 kg)  08/07/23 (!) 326 lb (147.9 kg)    Constitutional:      Appearance: Healthy appearance. Not in distress.  Pulmonary:     Breath sounds: No wheezing. No rales.  Cardiovascular:     Normal rate. Regular rhythm.     Murmurs: There is no murmur.  Edema:    Peripheral edema absent.        Assessment and Plan:    Assessment & Plan History of stroke Recent stroke felt to likely be due to small vessel disease. Cardiac monitor showed no atrial fibrillation.Echocardiogram showed normal LVF. No further cardiac testing needed at this time.  Paroxysmal atrial fibrillation (HCC) Remote episode of atrial fibrillation > 10 years ago. This was likely medication-induced. Recent monitor neg for atrial fibrillation. As his stroke was likely related to small vessel disease and he did not have atrial fibrillation on his monitor, I do not think he needs a referral to EP for ILR. If he has recurrent atrial fibrillation in the future, he will need long term anticoagulation.  Essential hypertension The patient's blood pressure is controlled on his current regimen. Recent K+ was low. I offered to recheck this today but he declined. He  plans to follow this with primary care.         Dispo:  Return in about 1 year (around 09/09/2024) for Routine Follow Up w/ Dr. Sheena.  Signed, Glendia Ferrier, PA-C

## 2023-09-10 ENCOUNTER — Ambulatory Visit: Attending: Physician Assistant | Admitting: Physician Assistant

## 2023-09-10 ENCOUNTER — Encounter: Payer: Self-pay | Admitting: Physician Assistant

## 2023-09-10 VITALS — BP 116/70 | HR 64 | Ht 73.0 in | Wt 318.2 lb

## 2023-09-10 DIAGNOSIS — Z8673 Personal history of transient ischemic attack (TIA), and cerebral infarction without residual deficits: Secondary | ICD-10-CM | POA: Diagnosis not present

## 2023-09-10 DIAGNOSIS — I480 Paroxysmal atrial fibrillation: Secondary | ICD-10-CM | POA: Diagnosis not present

## 2023-09-10 DIAGNOSIS — I10 Essential (primary) hypertension: Secondary | ICD-10-CM

## 2023-09-10 MED ORDER — CHLORTHALIDONE 25 MG PO TABS: 25.0000 mg | ORAL_TABLET | Freq: Every day | ORAL | 3 refills | Status: AC

## 2023-09-10 MED ORDER — NEBIVOLOL HCL 20 MG PO TABS: 20.0000 mg | ORAL_TABLET | Freq: Every day | ORAL | 3 refills | Status: AC

## 2023-09-10 MED ORDER — ROSUVASTATIN CALCIUM 20 MG PO TABS: 20.0000 mg | ORAL_TABLET | Freq: Every day | ORAL | 3 refills | Status: AC

## 2023-09-10 MED ORDER — OLMESARTAN MEDOXOMIL 40 MG PO TABS: 40.0000 mg | ORAL_TABLET | Freq: Every day | ORAL | 3 refills | Status: AC

## 2023-09-10 MED ORDER — CLOPIDOGREL BISULFATE 75 MG PO TABS: 75.0000 mg | ORAL_TABLET | Freq: Every day | ORAL | 3 refills | Status: DC

## 2023-09-10 MED ORDER — POTASSIUM CHLORIDE CRYS ER 20 MEQ PO TBCR: 20.0000 meq | EXTENDED_RELEASE_TABLET | Freq: Two times a day (BID) | ORAL | 3 refills | Status: DC

## 2023-09-10 NOTE — Assessment & Plan Note (Signed)
 The patient's blood pressure is controlled on his current regimen. Recent K+ was low. I offered to recheck this today but he declined. He plans to follow this with primary care.

## 2023-09-10 NOTE — Assessment & Plan Note (Signed)
 Remote episode of atrial fibrillation > 10 years ago. This was likely medication-induced. Recent monitor neg for atrial fibrillation. As his stroke was likely related to small vessel disease and he did not have atrial fibrillation on his monitor, I do not think he needs a referral to EP for ILR. If he has recurrent atrial fibrillation in the future, he will need long term anticoagulation.

## 2023-09-10 NOTE — Patient Instructions (Signed)
 Medication Instructions:  Your physician recommends that you continue on your current medications as directed. Please refer to the Current Medication list given to you today.  *If you need a refill on your cardiac medications before your next appointment, please call your pharmacy*  Lab Work: None ordered  If you have labs (blood work) drawn today and your tests are completely normal, you will receive your results only by: MyChart Message (if you have MyChart) OR A paper copy in the mail If you have any lab test that is abnormal or we need to change your treatment, we will call you to review the results.  Testing/Procedures: None ordered  Follow-Up: At [facility], you and your health needs are our priority.  As part of our continuing mission to provide you with exceptional heart care, our providers are all part of one team.  This team includes your primary Cardiologist (physician) and Advanced Practice Providers or APPs (Physician Assistants and Nurse Practitioners) who all work together to provide you with the care you need, when you need it.  Your next appointment:   1 year(s)  Provider:   Kardie Tobb, DO    We recommend signing up for the patient portal called MyChart.  Sign up information is provided on this After Visit Summary.  MyChart is used to connect with patients for Virtual Visits (Telemedicine).  Patients are able to view lab/test results, encounter notes, upcoming appointments, etc.  Non-urgent messages can be sent to your provider as well.   To learn more about what you can do with MyChart, go to [email or web address].   Other Instructions

## 2023-09-24 ENCOUNTER — Encounter: Payer: Self-pay | Admitting: Medical-Surgical

## 2023-09-24 ENCOUNTER — Ambulatory Visit (INDEPENDENT_AMBULATORY_CARE_PROVIDER_SITE_OTHER): Admitting: Medical-Surgical

## 2023-09-24 VITALS — BP 129/81 | HR 57 | Resp 20 | Ht 73.0 in | Wt 318.1 lb

## 2023-09-24 DIAGNOSIS — Z202 Contact with and (suspected) exposure to infections with a predominantly sexual mode of transmission: Secondary | ICD-10-CM

## 2023-09-24 NOTE — Progress Notes (Signed)
        Established patient visit  Discussed the use of AI scribe software for clinical note transcription with the patient, who gave verbal consent to proceed.  History of Present Illness   Zachary Cruz is a 43 year old male who presents for STI testing following a recent unprotected sexual encounter.  Genitourinary symptoms - Experienced pruritus in the groin area a couple of weeks ago following a recent sexual relationship; pruritus has resolved - Presence of a bump in the groin area that has not changed in appearance - No urethral discharge - No unusual urine odors - No pelvic pain  Recent sexual exposure - Recent protected sexual intercourse - Seeks testing for sexually transmitted infections for reassurance  Systemic and musculoskeletal symptoms - No unusual joint pains   Productive cough - Had recent viral illness that lasted about 24 hours - Continues with chest congestion and cough productive of thick yellow sputum      Physical Exam Vitals and nursing note reviewed.  Constitutional:      General: He is not in acute distress.    Appearance: Normal appearance.  HENT:     Head: Normocephalic and atraumatic.  Cardiovascular:     Rate and Rhythm: Normal rate and regular rhythm.     Pulses: Normal pulses.     Heart sounds: Normal heart sounds. No murmur heard.    No friction rub. No gallop.  Pulmonary:     Effort: Pulmonary effort is normal. No respiratory distress.     Breath sounds: Normal breath sounds.  Genitourinary:    Comments: Patient declined assessment of the groin area. Skin:    General: Skin is warm and dry.  Neurological:     Mental Status: He is alert and oriented to person, place, and time.  Psychiatric:        Mood and Affect: Mood normal.        Behavior: Behavior normal.        Thought Content: Thought content normal.        Judgment: Judgment normal.    Assessment and Plan    Possible sexually transmitted infection  (STI) Reports groin itching and bump post-unprotected sexual encounter. Differential includes folliculitis or STI. Opted for STI profile testing. - Order STI profile including HIV, hepatitis B, hepatitis C, syphilis, chlamydia, gonorrhea, trichomonas.  Post-viral cough Persistent thick yellow mucus post-viral illness. Explained post-viral cough duration and advised on mucus clearance strategies. - Recommend Mucinex to thin mucus. - Advise increased water intake. - Monitor for signs of secondary bacterial infection.  Follow-up Has GI follow-up on August 22 and annual cardiology follow-ups. No current family practice follow-up. - Schedule follow-up appointment in 6 months for routine check-up.  Recording duration: 9 minutes     __________________________________ Zada FREDRIK Palin, DNP, APRN, FNP-BC Primary Care and Sports Medicine [facility]

## 2023-09-26 ENCOUNTER — Ambulatory Visit: Payer: Self-pay | Admitting: Medical-Surgical

## 2023-09-26 LAB — STI PROFILE, CT/NG/TV
Chlamydia by NAA: NEGATIVE
Gonococcus by NAA: NEGATIVE
HCV Ab: NONREACTIVE
HIV Screen 4th Generation wRfx: NONREACTIVE
Hep B Core Total Ab: NEGATIVE
Hep B Surface Ab, Qual: REACTIVE
Hepatitis B Surface Ag: NEGATIVE
RPR Ser Ql: NONREACTIVE
Trich vag by NAA: NEGATIVE

## 2023-09-26 LAB — HCV INTERPRETATION

## 2023-10-07 ENCOUNTER — Encounter: Payer: Self-pay | Admitting: Medical-Surgical

## 2023-11-02 ENCOUNTER — Ambulatory Visit: Admitting: Gastroenterology

## 2023-11-26 ENCOUNTER — Encounter: Payer: Self-pay | Admitting: Medical-Surgical

## 2023-12-10 ENCOUNTER — Other Ambulatory Visit (HOSPITAL_BASED_OUTPATIENT_CLINIC_OR_DEPARTMENT_OTHER): Payer: Self-pay | Admitting: Physician Assistant

## 2024-01-02 ENCOUNTER — Encounter: Payer: Self-pay | Admitting: Medical-Surgical

## 2024-01-03 ENCOUNTER — Ambulatory Visit (INDEPENDENT_AMBULATORY_CARE_PROVIDER_SITE_OTHER): Admitting: Medical-Surgical

## 2024-01-03 ENCOUNTER — Encounter: Payer: Self-pay | Admitting: Medical-Surgical

## 2024-01-03 VITALS — BP 133/75 | HR 59 | Resp 20 | Ht 73.0 in | Wt 317.0 lb

## 2024-01-03 DIAGNOSIS — B001 Herpesviral vesicular dermatitis: Secondary | ICD-10-CM | POA: Diagnosis not present

## 2024-01-03 DIAGNOSIS — Z23 Encounter for immunization: Secondary | ICD-10-CM | POA: Diagnosis not present

## 2024-01-03 DIAGNOSIS — S90819A Abrasion, unspecified foot, initial encounter: Secondary | ICD-10-CM | POA: Diagnosis not present

## 2024-01-03 MED ORDER — VALACYCLOVIR HCL 1 G PO TABS: 2000.0000 mg | ORAL_TABLET | Freq: Two times a day (BID) | ORAL | 0 refills | Status: AC

## 2024-01-03 NOTE — Progress Notes (Signed)
        Established patient visit   History of Present Illness   Discussed the use of AI scribe software for clinical note transcription with the patient, who gave verbal consent to proceed.  History of Present Illness   Zachary Cruz Zachary Cruz is a 43 year old male who presents with persistent foot pain following a pedicure.  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  Constitutional:      General: He is not in acute distress.    Appearance: Normal appearance. He is not ill-appearing.  HENT:     Head: Normocephalic and atraumatic.  Cardiovascular:     Rate and Rhythm: Normal rate and regular rhythm.     Pulses: Normal pulses.     Heart sounds: Normal heart sounds. No murmur heard.    No friction rub. No gallop.  Pulmonary:     Effort: Pulmonary effort is normal. No respiratory distress.     Breath sounds: Normal breath sounds.  Skin:    General: Skin is warm and dry.  Neurological:     Mental Status: He is alert and oriented to person, place, and time.  Psychiatric:        Mood and Affect: Mood normal.        Behavior: Behavior normal.        Thought Content: Thought content normal.        Judgment: Judgment normal.    Assessment & Plan     Healed abrasion of foot Abrasion healed with closed skin, no infection or gangrene. Sensitivity likely due to new skin. - Monitor for changes as skin toughens. - Advise protection and avoidance of irritation until sensitivity decreases.   Cold sores More frequent episodes with current  tingling indicating a new sore forming. Prior use of Abreva but no oral therapy.  - Adding Valtrex 2000mg  BID x 2 doses prn cold sores.    Follow up   Return if symptoms worsen or fail to improve. __________________________________ Zada FREDRIK Palin, DNP, APRN, FNP-BC Primary Care and Sports Medicine [facility] [place]

## 2024-01-03 NOTE — Telephone Encounter (Signed)
 Patient scheduled an appointment to see joy today on MyChart

## 2024-01-09 ENCOUNTER — Encounter: Payer: Self-pay | Admitting: Gastroenterology

## 2024-01-09 ENCOUNTER — Ambulatory Visit (INDEPENDENT_AMBULATORY_CARE_PROVIDER_SITE_OTHER): Admitting: Gastroenterology

## 2024-01-09 ENCOUNTER — Other Ambulatory Visit (INDEPENDENT_AMBULATORY_CARE_PROVIDER_SITE_OTHER)

## 2024-01-09 VITALS — BP 128/72 | HR 66 | Ht 74.0 in | Wt 316.2 lb

## 2024-01-09 DIAGNOSIS — L290 Pruritus ani: Secondary | ICD-10-CM

## 2024-01-09 DIAGNOSIS — K51919 Ulcerative colitis, unspecified with unspecified complications: Secondary | ICD-10-CM

## 2024-01-09 DIAGNOSIS — K51219 Ulcerative (chronic) proctitis with unspecified complications: Secondary | ICD-10-CM

## 2024-01-09 DIAGNOSIS — K5100 Ulcerative (chronic) pancolitis without complications: Secondary | ICD-10-CM | POA: Diagnosis not present

## 2024-01-09 LAB — BASIC METABOLIC PANEL WITH GFR
BUN: 18 mg/dL (ref 6–23)
CO2: 31 meq/L (ref 19–32)
Calcium: 9 mg/dL (ref 8.4–10.5)
Chloride: 101 meq/L (ref 96–112)
Creatinine, Ser: 1.24 mg/dL (ref 0.40–1.50)
GFR: 71.36 mL/min (ref >60.00)
Glucose, Bld: 97 mg/dL (ref 70–99)
Potassium: 3 meq/L — ABNORMAL LOW (ref 3.5–5.1)
Sodium: 140 meq/L (ref 135–145)

## 2024-01-09 LAB — CBC
HCT: 40.3 % (ref 39.0–52.0)
Hemoglobin: 13.4 g/dL (ref 13.0–17.0)
MCHC: 33.2 g/dL (ref 30.0–36.0)
MCV: 80.6 fl (ref 78.0–100.0)
Platelets: 300 K/uL (ref 150.0–400.0)
RBC: 5 Mil/uL (ref 4.22–5.81)
RDW: 13.6 % (ref 11.5–15.5)
WBC: 6.7 K/uL (ref 4.0–10.5)

## 2024-01-09 NOTE — Patient Instructions (Signed)
 _______________________________________________________  If your blood pressure at your visit was 140/90 or greater, please contact your primary care physician to follow up on this.  _______________________________________________________  If you are age 43 or older, your body mass index should be between 23-30. Your Body mass index is 40.6 kg/m. If this is out of the aforementioned range listed, please consider follow up with your Primary Care Provider.  If you are age 71 or younger, your body mass index should be between 19-25. Your Body mass index is 40.6 kg/m. If this is out of the aformentioned range listed, please consider follow up with your Primary Care Provider.   ________________________________________________________  The [facility] providers would like to encourage you to use MYCHART to communicate with providers for non-urgent requests or questions.  Due to long hold times on the telephone, sending your provider a message by [facility] may be a faster and more efficient way to get a response.  Please allow 48 business hours for a response.  Please remember that this is for non-urgent requests.  _______________________________________________________  [facility] is using a team-based approach to care.  Your team is made up of your doctor and two to three APPS. Our APPS (Nurse Practitioners and Physician Assistants) work with your physician to ensure care continuity for you. They are fully qualified to address your health concerns and develop a treatment plan. They communicate directly with your gastroenterologist to care for you. Seeing the Advanced Practice Practitioners on your physician's team can help you by facilitating care more promptly, often allowing for earlier appointments, access to diagnostic testing, procedures, and other specialty referrals.   Your provider has requested that you go to the basement level for lab work before leaving today. Press B on the  elevator. The lab is located at the first door on the left as you exit the elevator.  Due to recent changes in healthcare laws, you may see the results of your imaging and laboratory studies on MyChart before your provider has had a chance to review them.  We understand that in some cases there may be results that are confusing or concerning to you. Not all laboratory results come back in the same time frame and the provider may be waiting for multiple results in order to interpret others.  Please give us  48 hours in order for your provider to thoroughly review all the results before contacting the office for clarification of your results.   It was a pleasure to see you today!  Vito Cirigliano, D.O.

## 2024-01-09 NOTE — Progress Notes (Unsigned)
 Chief Complaint: Perianal discomfort, proctitis   Referring Provider:    [name], NP    HPI:     Zachary Cruz is a 43 y.o. 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  Reports episodic anal discomfort. Sxs started about 1 month ago and described as irritation and itching. No diarrhea, constipation.  No hematochezia or melena.   Previously followed with Dr. Kriss at [facility]. No records available for review. Colonoscopy 12/2021 notable for polyps (unsure of number, size, histology), and diagnosed with Ulcerative Proctitis. Has been taking Lialda 2.4 gm daily since then.  Indication for colonoscopy was hematochezia which has since resolved with Lialda.   No repeat colonscopy since then. No prior steroids or biologic therapy.  Additionally, has mostly employed a low FODMAP diet since then.  Unsure if he has overlapping IBS or not, but overall feels better with that diet.  Had follow-up with Cardiology Clinic on 09/10/2023 after hospital admission 07/2023 with CVA felt secondary to small vessel disease.  Remote history of A-fib 10+ years ago, likely medication induced and recent monitoring negative for A-fib.  No previous EGD.   Past Medical History:  Diagnosis Date   Atrial fibrillation (HCC)    Childhood asthma    Hypertension    Sleep apnea    Stroke [facility])      Past Surgical History:  Procedure Laterality Date   CHOLECYSTECTOMY N/A 11/08/2016   Procedure: LAPAROSCOPIC CHOLECYSTECTOMY;  Surgeon: Wonda Charlie BRAVO, MD;  Location: [facility] ORS;  Service: General;  Laterality: N/A;   TONSILLECTOMY     Family History  Problem Relation Age of Onset   Diabetes Father    Mental illness Father    Hypertension Maternal Grandfather    Stroke Neg Hx    Social History   Tobacco Use   Smoking status: Former    Current  packs/day: 0.00    Average packs/day: 0.1 packs/day for 7.0 years (0.7 ttl pk-yrs)    Types: Cigarettes    Start date: 04/1996    Quit date: 04/2003    Years since quitting: 20.7   Smokeless tobacco: Never  Vaping Use   Vaping status: Never Used  Substance Use Topics   Alcohol use: No   Drug use: Not Currently    Comment: Marijuana as a teenager.   Current Outpatient Medications  Medication Sig Dispense Refill   chlorthalidone  (HYGROTON ) 25 MG tablet Take 1 tablet (25 mg total) by mouth daily. 90 tablet 3   clopidogrel  (PLAVIX ) 75 MG tablet Take 1 tablet (75 mg total) by mouth daily. 90 tablet 3   mesalamine (LIALDA) 1.2 g EC tablet Take 2.4 g by mouth daily with breakfast.     Nebivolol  HCl 20 MG TABS Take 1 tablet (20 mg total) by mouth daily. 90 tablet 3   olmesartan  (BENICAR ) 40 MG tablet Take 1 tablet (40 mg total) by mouth daily. 90 tablet 3   potassium chloride  SA (KLOR-CON  M) 20 MEQ tablet TAKE 1 TABLET(20 MEQ) BY MOUTH TWICE DAILY 60 tablet 1   rosuvastatin  (CRESTOR ) 20 MG tablet Take 1 tablet (20 mg total) by mouth daily. 90 tablet 3   No current facility-administered medications for this visit.   Allergies  Allergen Reactions   Amlodipine  Other (See Comments)  Gingival hyperplasia   Benzoate Hives     Review of Systems: All systems reviewed and negative except where noted in HPI.     Physical Exam:    Wt Readings from Last 3 Encounters:  01/09/24 (!) 316 lb 4 oz (143.5 kg)  01/03/24 (!) 317 lb (143.8 kg)  09/24/23 (!) 318 lb 1.9 oz (144.3 kg)    BP 128/72   Pulse 66   Ht 6' 2 (1.88 m)   Wt (!) 316 lb 4 oz (143.5 kg)   BMI 40.60 kg/m  Constitutional:  Pleasant, in no acute distress. Psychiatric: Normal mood and affect. Behavior is normal. Cardiovascular: Normal rate, regular rhythm. No edema Pulmonary/chest: Effort normal and breath sounds normal. No wheezing, rales or rhonchi. Abdominal: Soft, nondistended, nontender. Bowel sounds active  throughout. There are no masses palpable. No hepatomegaly. Neurological: Alert and oriented to person place and time. Skin: Skin is warm and dry. No rashes noted. Rectal exam: Sensation intact and preserved anal wink. No external anal fissures, external hemorrhoids or skin tags. Normal sphincter tone. No palpable mass. No blood on the exam glove. (Chaperone: Nat Sic, CMA).     ASSESSMENT AND PLAN;   1) Ulcerative Proctitis - Will request records from [facility] for review - Resume Lialda for the time being - Routine BMP and CBC check - Symptoms otherwise well-controlled - Will determine timing of repeat colonoscopy depending on records review  2) Pruritus ani Symptoms started about 1 month ago.  Rectal exam benign.  No associated changes in bowel habits or bleeding. - Desitin or similar barrier cream - Wet wipes and pat to dry after BM    Sandor LULLA Flatter, DO, FACG  01/09/2024, 9:31 AM   [name], NP

## 2024-01-16 ENCOUNTER — Ambulatory Visit: Payer: Self-pay | Admitting: Gastroenterology

## 2024-01-21 ENCOUNTER — Telehealth: Payer: Self-pay | Admitting: Gastroenterology

## 2024-01-21 ENCOUNTER — Encounter: Payer: Self-pay | Admitting: Medical-Surgical

## 2024-01-21 NOTE — Telephone Encounter (Signed)
 PT is calling to have a new prescription for mesalamine sent to [facility] in [place]. Please advise.

## 2024-01-22 MED ORDER — MESALAMINE 1.2 G PO TBEC: 2.4000 g | DELAYED_RELEASE_TABLET | Freq: Every day | ORAL | 11 refills | Status: AC

## 2024-01-22 NOTE — Telephone Encounter (Signed)
Rx for mesalamine sent to pharmacy as requested.

## 2024-01-25 ENCOUNTER — Other Ambulatory Visit: Payer: Self-pay

## 2024-01-28 ENCOUNTER — Other Ambulatory Visit: Payer: Self-pay | Admitting: Physician Assistant

## 2024-01-31 ENCOUNTER — Telehealth: Payer: Self-pay | Admitting: Cardiology

## 2024-01-31 MED ORDER — CLOPIDOGREL BISULFATE 75 MG PO TABS: 75.0000 mg | ORAL_TABLET | Freq: Every day | ORAL | 2 refills | Status: AC

## 2024-01-31 NOTE — Telephone Encounter (Signed)
*  STAT* If patient is at the pharmacy, call can be transferred to refill team.   1. Which medications need to be refilled? (please list name of each medication and dose if known) clopidogrel  (PLAVIX ) 75 MG tablet   Take 1 tablet (75 mg total) by mouth daily.    2. Would you like to learn more about the convenience, safety, & potential cost savings by using the [facility]? No    3. Are you open to using the [facility] No   4. Which pharmacy/location (including street and city if local pharmacy) is medication to be sent to? [id number] - [place], [place] - [address]     5. Do they need a 30 day or 90 day supply? 90 Day Supply  Pt is currently out of medication

## 2024-01-31 NOTE — Telephone Encounter (Signed)
 Refill sent.

## 2024-02-11 ENCOUNTER — Telehealth: Payer: Self-pay | Admitting: Medical-Surgical

## 2024-02-11 ENCOUNTER — Telehealth: Payer: Self-pay | Admitting: Cardiology

## 2024-02-11 NOTE — Telephone Encounter (Signed)
 Copied from CRM #[id number]. Topic: Clinical - Medication Refill >> Feb 11, 2024 12:07 PM Mercer PEDLAR wrote: Medication: potassium chloride  SA (KLOR-CON  M) 20 MEQ tablet  Has the patient contacted their pharmacy? Yes (Agent: If no, request that the patient contact the pharmacy for the refill. If patient does not wish to contact the pharmacy document the reason why and proceed with request.) (Agent: If yes, when and what did the pharmacy advise?)  This is the patient's preferred pharmacy:  [facility] #[id number] - [place], [place] - [address] [place] [address] Phone: [phone] Fax: [phone]  Is this the correct pharmacy for this prescription? Yes If no, delete pharmacy and type the correct one.   Has the prescription been filled recently? No  Is the patient out of the medication? Yes  Has the patient been seen for an appointment in the last year OR does the patient have an upcoming appointment? Yes  Can we respond through MyChart? Yes  Agent: Please be advised that Rx refills may take up to 3 business days. We ask that you follow-up with your pharmacy.

## 2024-02-11 NOTE — Telephone Encounter (Signed)
*  STAT* If patient is at the pharmacy, call can be transferred to refill team.   1. Which medications need to be refilled? (please list name of each medication and dose if known)   potassium chloride  SA (KLOR-CON  M) 20 MEQ tablet    2. Which pharmacy/location (including street and city if local pharmacy) is medication to be sent to?  [id number] - [place], [place] - [address]      3. Do they need a 30 day or 90 day supply? 90 day    Pt is out of medication

## 2024-02-12 MED ORDER — POTASSIUM CHLORIDE CRYS ER 20 MEQ PO TBCR: 20.0000 meq | EXTENDED_RELEASE_TABLET | Freq: Two times a day (BID) | ORAL | 2 refills | Status: AC

## 2024-02-12 NOTE — Telephone Encounter (Signed)
 Refill sent

## 2024-02-14 NOTE — Telephone Encounter (Signed)
 Patient charts shows medication refill already sent on 02/12/2024 by outside provider

## 2024-03-10 ENCOUNTER — Encounter: Payer: Self-pay | Admitting: Medical-Surgical

## 2024-03-17 NOTE — Telephone Encounter (Signed)
 Per the provider's request - the patient is scheduled to see the provider on 03/19/24 at 1110 am.

## 2024-03-19 ENCOUNTER — Ambulatory Visit: Admitting: Medical-Surgical

## 2024-03-19 ENCOUNTER — Encounter: Payer: Self-pay | Admitting: Medical-Surgical

## 2024-03-19 VITALS — BP 127/85 | HR 52 | Temp 99.2°F | Resp 20 | Ht 73.0 in | Wt 319.1 lb

## 2024-03-19 DIAGNOSIS — I10 Essential (primary) hypertension: Secondary | ICD-10-CM

## 2024-03-19 DIAGNOSIS — L659 Nonscarring hair loss, unspecified: Secondary | ICD-10-CM | POA: Insufficient documentation

## 2024-03-19 DIAGNOSIS — G459 Transient cerebral ischemic attack, unspecified: Secondary | ICD-10-CM | POA: Diagnosis not present

## 2024-03-19 DIAGNOSIS — K648 Other hemorrhoids: Secondary | ICD-10-CM | POA: Diagnosis not present

## 2024-03-19 DIAGNOSIS — R7303 Prediabetes: Secondary | ICD-10-CM | POA: Diagnosis not present

## 2024-03-19 LAB — POCT GLYCOSYLATED HEMOGLOBIN (HGB A1C): Hemoglobin A1C: 5.8 % — AB (ref 4.0–5.6)

## 2024-03-19 NOTE — Progress Notes (Signed)
 "       Established patient visit   History of Present Illness   Discussed the use of AI scribe software for clinical note transcription with the patient, who gave verbal consent to proceed.  History of Present Illness   Zachary Cruz Michael is a 44 year old male who presents with persistent lower back pain and recent blood in stool.  Lower back pain - Persistent for approximately one month following sleeping in an awkward position - Pain is mild, rated 1-2/10, more annoying than limiting - Not triggered by specific activities - Duration longer than usual 2-3 day episodes - No longer using Tylenol  for pain control - No associated leg numbness, tingling, or weakness  Hematochezia - Bright red blood in stool for 2 days days, onset after weightlifting - Blood mixed with stool rather than on toilet paper - No pain with bowel movements - Stools remain brown  Testosterone concerns - Requests testosterone level check - No low libido, fatigue, or erectile dysfunction - No prior testosterone testing - History of early onset hair loss  Prediabetes - A1c was 6.2 on last check - Off diet for past four weeks - Attempting to resume healthier eating  Neurological symptoms - History of prior stroke-like or TIA symptoms - Occasional brief involuntary carotid 'jumps' when stopping exercise   Physical Exam   Physical Exam Vitals and nursing note reviewed.  Constitutional:      General: He is not in acute distress.    Appearance: Normal appearance. He is not ill-appearing.  HENT:     Head: Normocephalic and atraumatic.  Cardiovascular:     Rate and Rhythm: Normal rate and regular rhythm.     Pulses: Normal pulses.     Heart sounds: Normal heart sounds. No murmur heard.    No friction rub. No gallop.  Pulmonary:     Effort: Pulmonary effort is normal. No respiratory distress.     Breath sounds: Normal breath sounds.  Skin:    General: Skin is warm and dry.   Neurological:     Mental Status: He is alert and oriented to person, place, and time.  Psychiatric:        Mood and Affect: Mood normal.        Behavior: Behavior normal.        Thought Content: Thought content normal.        Judgment: Judgment normal.    Assessment & Plan   Low back pain Chronic low back pain likely due to poor sleeping posture. Pain mild, not activity-related. Muscle relaxers contraindicated due to Plavix . - Provided printout of home exercises and stretches for low back pain. - Declined offer for management with muscle relaxers.  Internal hemorrhoids Intermittent bright red blood in stool likely due to internal hemorrhoids exacerbated by heavy lifting. No pain during bowel movements. - Advised monitoring symptoms, especially with heavy lifting. - Discussed potential use of Anusol  suppositories if symptoms recur.  Morbid obesity BMI 42. Discussed creatine supplementation, noting it is not FDA regulated and may affect lab work. - Encouraged consultation with a pharmacist regarding creatine supplementation. - Continue regular intentional exercise. - Continue to work on dietary modifications to aid with weight loss.  Prediabetes A1c improved from 6.2 to 5.8, indicating better control. - Continue monitoring A1c every six months. -Continue working on dietary modification and regular intentional exercise as noted.  Nonscarring hair loss Hair loss since 20s and continues to see changes in hairline and hair density. -  Ordered testosterone level check.  Transient ischemic attack Occasional involuntary carotid artery pulsations, no recent stroke-like symptoms. Imaging may be needed to rule out stenosis. - Ordered carotid ultrasound to assess for stenosis.   Hypertension Followed by cardiology.  Doing well on current management with chlorthalidone , olmesartan , Nebivolol , Crestor , and Plavix .  No concerning findings today. - Blood pressure 130/81 on arrival, recheck  127/85 and stable. -Follow-up with cardiology as instructed.    Follow up   Return in about 6 months (around 09/16/2024) for chronic disease follow up. __________________________________ Zada FREDRIK Palin, DNP, APRN, FNP-BC Primary Care and Sports Medicine [facility] [place] "

## 2024-03-20 ENCOUNTER — Ambulatory Visit: Payer: Self-pay | Admitting: Medical-Surgical

## 2024-03-20 LAB — BASIC METABOLIC PANEL WITH GFR
BUN/Creatinine Ratio: 10 (ref 9–20)
BUN: 12 mg/dL (ref 6–24)
CO2: 25 mmol/L (ref 20–29)
Calcium: 9.4 mg/dL (ref 8.7–10.2)
Chloride: 100 mmol/L (ref 96–106)
Creatinine, Ser: 1.22 mg/dL (ref 0.76–1.27)
Glucose: 85 mg/dL (ref 70–99)
Potassium: 3.8 mmol/L (ref 3.5–5.2)
Sodium: 143 mmol/L (ref 134–144)
eGFR: 75 mL/min/1.73

## 2024-03-20 LAB — TESTOSTERONE: Testosterone: 301 ng/dL (ref 264–916)

## 2024-03-26 ENCOUNTER — Ambulatory Visit: Admitting: Medical-Surgical

## 2024-04-04 ENCOUNTER — Ambulatory Visit (HOSPITAL_BASED_OUTPATIENT_CLINIC_OR_DEPARTMENT_OTHER)
Admission: RE | Admit: 2024-04-04 | Discharge: 2024-04-04 | Disposition: A | Source: Ambulatory Visit | Attending: Medical-Surgical

## 2024-04-04 DIAGNOSIS — G459 Transient cerebral ischemic attack, unspecified: Secondary | ICD-10-CM | POA: Diagnosis present
